# Patient Record
Sex: Female | Born: 1935
Health system: Southern US, Community
[De-identification: ages and names within clinical notes are randomized; demographics above are authoritative.]

## PROBLEM LIST (undated history)

## (undated) DIAGNOSIS — I10 Essential (primary) hypertension: Secondary | ICD-10-CM

## (undated) DIAGNOSIS — J449 Chronic obstructive pulmonary disease, unspecified: Secondary | ICD-10-CM

## (undated) DIAGNOSIS — M1990 Unspecified osteoarthritis, unspecified site: Secondary | ICD-10-CM

## (undated) DIAGNOSIS — A809 Acute poliomyelitis, unspecified: Secondary | ICD-10-CM

## (undated) DIAGNOSIS — K589 Irritable bowel syndrome without diarrhea: Secondary | ICD-10-CM

## (undated) DIAGNOSIS — E039 Hypothyroidism, unspecified: Secondary | ICD-10-CM

## (undated) DIAGNOSIS — J45909 Unspecified asthma, uncomplicated: Secondary | ICD-10-CM

## (undated) DIAGNOSIS — D352 Benign neoplasm of pituitary gland: Secondary | ICD-10-CM

## (undated) DIAGNOSIS — K5790 Diverticulosis of intestine, part unspecified, without perforation or abscess without bleeding: Secondary | ICD-10-CM

## (undated) DIAGNOSIS — B029 Zoster without complications: Secondary | ICD-10-CM

## (undated) DIAGNOSIS — E0590 Thyrotoxicosis, unspecified without thyrotoxic crisis or storm: Secondary | ICD-10-CM

## (undated) DIAGNOSIS — K219 Gastro-esophageal reflux disease without esophagitis: Secondary | ICD-10-CM

## (undated) DIAGNOSIS — I8290 Acute embolism and thrombosis of unspecified vein: Secondary | ICD-10-CM

## (undated) DIAGNOSIS — C55 Malignant neoplasm of uterus, part unspecified: Secondary | ICD-10-CM

## (undated) DIAGNOSIS — C801 Malignant (primary) neoplasm, unspecified: Secondary | ICD-10-CM

## (undated) DIAGNOSIS — G629 Polyneuropathy, unspecified: Secondary | ICD-10-CM

## (undated) DIAGNOSIS — G2581 Restless legs syndrome: Secondary | ICD-10-CM

## (undated) DIAGNOSIS — J309 Allergic rhinitis, unspecified: Secondary | ICD-10-CM

## (undated) HISTORY — PX: CATARACT EXTRACTION: SUR2

## (undated) HISTORY — PX: REPLACEMENT TOTAL KNEE: SUR1224

## (undated) HISTORY — PX: CARPAL TUNNEL RELEASE: SHX101

## (undated) HISTORY — PX: CERVICAL POLYPECTOMY: SHX88

## (undated) HISTORY — PX: TOTAL SHOULDER REPLACEMENT: SUR1217

## (undated) HISTORY — PX: TONSILLECTOMY: SUR1361

---

## 1997-12-02 DIAGNOSIS — D352 Benign neoplasm of pituitary gland: Secondary | ICD-10-CM

## 1997-12-02 HISTORY — DX: Benign neoplasm of pituitary gland: D35.2

## 1998-03-27 ENCOUNTER — Other Ambulatory Visit: Admission: RE | Admit: 1998-03-27 | Discharge: 1998-03-27 | Payer: Self-pay | Admitting: Gastroenterology

## 1998-03-30 ENCOUNTER — Inpatient Hospital Stay (HOSPITAL_COMMUNITY): Admission: AD | Admit: 1998-03-30 | Discharge: 1998-04-01 | Payer: Self-pay | Admitting: Gastroenterology

## 1998-04-06 ENCOUNTER — Other Ambulatory Visit: Admission: RE | Admit: 1998-04-06 | Discharge: 1998-04-06 | Payer: Self-pay | Admitting: Obstetrics and Gynecology

## 1998-06-08 ENCOUNTER — Other Ambulatory Visit: Admission: RE | Admit: 1998-06-08 | Discharge: 1998-06-08 | Payer: Self-pay | Admitting: Obstetrics and Gynecology

## 1998-08-31 ENCOUNTER — Other Ambulatory Visit: Admission: RE | Admit: 1998-08-31 | Discharge: 1998-08-31 | Payer: Self-pay | Admitting: Obstetrics and Gynecology

## 1999-02-01 ENCOUNTER — Encounter: Payer: Self-pay | Admitting: Neurosurgery

## 1999-02-01 ENCOUNTER — Inpatient Hospital Stay (HOSPITAL_COMMUNITY): Admission: RE | Admit: 1999-02-01 | Discharge: 1999-02-07 | Payer: Self-pay | Admitting: Neurosurgery

## 1999-02-03 ENCOUNTER — Encounter: Payer: Self-pay | Admitting: Neurosurgery

## 1999-07-11 ENCOUNTER — Other Ambulatory Visit: Admission: RE | Admit: 1999-07-11 | Discharge: 1999-07-11 | Payer: Self-pay | Admitting: Obstetrics and Gynecology

## 1999-08-28 ENCOUNTER — Ambulatory Visit (HOSPITAL_COMMUNITY): Admission: RE | Admit: 1999-08-28 | Discharge: 1999-08-28 | Payer: Self-pay | Admitting: Obstetrics and Gynecology

## 1999-08-28 ENCOUNTER — Encounter (INDEPENDENT_AMBULATORY_CARE_PROVIDER_SITE_OTHER): Payer: Self-pay | Admitting: Specialist

## 1999-09-14 ENCOUNTER — Encounter: Payer: Self-pay | Admitting: Gynecology

## 1999-09-18 ENCOUNTER — Encounter (INDEPENDENT_AMBULATORY_CARE_PROVIDER_SITE_OTHER): Payer: Self-pay

## 1999-09-18 ENCOUNTER — Inpatient Hospital Stay (HOSPITAL_COMMUNITY): Admission: RE | Admit: 1999-09-18 | Discharge: 1999-09-20 | Payer: Self-pay | Admitting: Gynecology

## 2000-04-14 ENCOUNTER — Other Ambulatory Visit: Admission: RE | Admit: 2000-04-14 | Discharge: 2000-04-14 | Payer: Self-pay | Admitting: Obstetrics and Gynecology

## 2000-06-27 ENCOUNTER — Other Ambulatory Visit: Admission: RE | Admit: 2000-06-27 | Discharge: 2000-06-27 | Payer: Self-pay | Admitting: Obstetrics and Gynecology

## 2000-11-13 ENCOUNTER — Encounter: Payer: Self-pay | Admitting: Gastroenterology

## 2000-11-13 ENCOUNTER — Encounter: Admission: RE | Admit: 2000-11-13 | Discharge: 2000-11-13 | Payer: Self-pay | Admitting: Gastroenterology

## 2000-11-19 ENCOUNTER — Encounter: Payer: Self-pay | Admitting: Gastroenterology

## 2000-11-19 ENCOUNTER — Encounter: Admission: RE | Admit: 2000-11-19 | Discharge: 2000-11-19 | Payer: Self-pay | Admitting: Gastroenterology

## 2000-12-08 ENCOUNTER — Other Ambulatory Visit: Admission: RE | Admit: 2000-12-08 | Discharge: 2000-12-08 | Payer: Self-pay | Admitting: Obstetrics and Gynecology

## 2001-04-06 ENCOUNTER — Emergency Department (HOSPITAL_COMMUNITY): Admission: EM | Admit: 2001-04-06 | Discharge: 2001-04-06 | Payer: Self-pay | Admitting: Emergency Medicine

## 2001-04-26 ENCOUNTER — Ambulatory Visit (HOSPITAL_COMMUNITY): Admission: RE | Admit: 2001-04-26 | Discharge: 2001-04-26 | Payer: Self-pay | Admitting: Neurosurgery

## 2001-04-26 ENCOUNTER — Encounter: Payer: Self-pay | Admitting: Neurosurgery

## 2001-04-30 ENCOUNTER — Encounter: Admission: RE | Admit: 2001-04-30 | Discharge: 2001-04-30 | Payer: Self-pay | Admitting: Neurosurgery

## 2001-04-30 ENCOUNTER — Encounter: Payer: Self-pay | Admitting: Neurosurgery

## 2001-05-09 ENCOUNTER — Emergency Department (HOSPITAL_COMMUNITY): Admission: EM | Admit: 2001-05-09 | Discharge: 2001-05-09 | Payer: Self-pay | Admitting: Emergency Medicine

## 2001-08-27 ENCOUNTER — Encounter: Admission: RE | Admit: 2001-08-27 | Discharge: 2001-08-27 | Payer: Self-pay | Admitting: Gastroenterology

## 2001-08-27 ENCOUNTER — Encounter: Payer: Self-pay | Admitting: Gastroenterology

## 2001-12-10 ENCOUNTER — Other Ambulatory Visit: Admission: RE | Admit: 2001-12-10 | Discharge: 2001-12-10 | Payer: Self-pay | Admitting: Gynecology

## 2001-12-25 ENCOUNTER — Encounter: Payer: Self-pay | Admitting: Orthopedic Surgery

## 2002-01-01 ENCOUNTER — Inpatient Hospital Stay (HOSPITAL_COMMUNITY): Admission: RE | Admit: 2002-01-01 | Discharge: 2002-01-02 | Payer: Self-pay | Admitting: Orthopedic Surgery

## 2002-01-01 ENCOUNTER — Encounter: Payer: Self-pay | Admitting: Orthopedic Surgery

## 2002-06-21 ENCOUNTER — Encounter: Payer: Self-pay | Admitting: Neurosurgery

## 2002-06-21 ENCOUNTER — Encounter: Admission: RE | Admit: 2002-06-21 | Discharge: 2002-06-21 | Payer: Self-pay | Admitting: Neurosurgery

## 2002-11-10 ENCOUNTER — Inpatient Hospital Stay (HOSPITAL_COMMUNITY): Admission: RE | Admit: 2002-11-10 | Discharge: 2002-11-13 | Payer: Self-pay | Admitting: Neurosurgery

## 2002-11-10 ENCOUNTER — Encounter: Payer: Self-pay | Admitting: Neurosurgery

## 2003-01-21 ENCOUNTER — Emergency Department (HOSPITAL_COMMUNITY): Admission: EM | Admit: 2003-01-21 | Discharge: 2003-01-21 | Payer: Self-pay | Admitting: *Deleted

## 2003-01-27 ENCOUNTER — Encounter: Payer: Self-pay | Admitting: Neurosurgery

## 2003-01-27 ENCOUNTER — Encounter: Admission: RE | Admit: 2003-01-27 | Discharge: 2003-01-27 | Payer: Self-pay | Admitting: Neurosurgery

## 2003-02-10 ENCOUNTER — Encounter: Admission: RE | Admit: 2003-02-10 | Discharge: 2003-02-10 | Payer: Self-pay | Admitting: Neurosurgery

## 2003-02-10 ENCOUNTER — Encounter: Payer: Self-pay | Admitting: Neurosurgery

## 2003-02-15 ENCOUNTER — Other Ambulatory Visit: Admission: RE | Admit: 2003-02-15 | Discharge: 2003-02-15 | Payer: Self-pay | Admitting: Gynecology

## 2003-02-24 ENCOUNTER — Encounter: Payer: Self-pay | Admitting: Neurosurgery

## 2003-02-24 ENCOUNTER — Encounter: Admission: RE | Admit: 2003-02-24 | Discharge: 2003-02-24 | Payer: Self-pay | Admitting: Neurosurgery

## 2004-02-07 ENCOUNTER — Encounter: Admission: RE | Admit: 2004-02-07 | Discharge: 2004-02-07 | Payer: Self-pay | Admitting: Gastroenterology

## 2004-02-14 ENCOUNTER — Encounter: Admission: RE | Admit: 2004-02-14 | Discharge: 2004-02-14 | Payer: Self-pay | Admitting: Gastroenterology

## 2004-02-16 ENCOUNTER — Other Ambulatory Visit: Admission: RE | Admit: 2004-02-16 | Discharge: 2004-02-16 | Payer: Self-pay | Admitting: Gynecology

## 2004-04-05 ENCOUNTER — Ambulatory Visit (HOSPITAL_BASED_OUTPATIENT_CLINIC_OR_DEPARTMENT_OTHER): Admission: RE | Admit: 2004-04-05 | Discharge: 2004-04-05 | Payer: Self-pay | Admitting: Otolaryngology

## 2004-04-20 ENCOUNTER — Inpatient Hospital Stay (HOSPITAL_COMMUNITY): Admission: RE | Admit: 2004-04-20 | Discharge: 2004-04-24 | Payer: Self-pay | Admitting: Specialist

## 2004-04-24 ENCOUNTER — Inpatient Hospital Stay (HOSPITAL_COMMUNITY)
Admission: RE | Admit: 2004-04-24 | Discharge: 2004-05-02 | Payer: Self-pay | Admitting: Physical Medicine & Rehabilitation

## 2004-08-08 ENCOUNTER — Other Ambulatory Visit: Admission: RE | Admit: 2004-08-08 | Discharge: 2004-08-08 | Payer: Self-pay | Admitting: Gynecology

## 2005-02-18 ENCOUNTER — Other Ambulatory Visit: Admission: RE | Admit: 2005-02-18 | Discharge: 2005-02-18 | Payer: Self-pay | Admitting: Gynecology

## 2005-03-19 ENCOUNTER — Encounter: Admission: RE | Admit: 2005-03-19 | Discharge: 2005-03-19 | Payer: Self-pay | Admitting: Specialist

## 2005-05-14 ENCOUNTER — Inpatient Hospital Stay (HOSPITAL_COMMUNITY): Admission: RE | Admit: 2005-05-14 | Discharge: 2005-05-17 | Payer: Self-pay | Admitting: Specialist

## 2005-05-17 ENCOUNTER — Inpatient Hospital Stay (HOSPITAL_COMMUNITY)
Admission: RE | Admit: 2005-05-17 | Discharge: 2005-05-28 | Payer: Self-pay | Admitting: Physical Medicine & Rehabilitation

## 2005-05-17 ENCOUNTER — Ambulatory Visit: Payer: Self-pay | Admitting: Physical Medicine & Rehabilitation

## 2005-10-02 ENCOUNTER — Other Ambulatory Visit: Admission: RE | Admit: 2005-10-02 | Discharge: 2005-10-02 | Payer: Self-pay | Admitting: Gynecology

## 2006-02-20 ENCOUNTER — Other Ambulatory Visit: Admission: RE | Admit: 2006-02-20 | Discharge: 2006-02-20 | Payer: Self-pay | Admitting: Gynecology

## 2007-02-24 ENCOUNTER — Other Ambulatory Visit: Admission: RE | Admit: 2007-02-24 | Discharge: 2007-02-24 | Payer: Self-pay | Admitting: Gynecology

## 2007-07-19 ENCOUNTER — Encounter: Admission: RE | Admit: 2007-07-19 | Discharge: 2007-07-19 | Payer: Self-pay | Admitting: Orthopedic Surgery

## 2008-06-21 ENCOUNTER — Encounter: Admission: RE | Admit: 2008-06-21 | Discharge: 2008-06-21 | Payer: Self-pay | Admitting: Endocrinology

## 2008-11-08 ENCOUNTER — Encounter: Admission: RE | Admit: 2008-11-08 | Discharge: 2008-11-08 | Payer: Self-pay | Admitting: Sports Medicine

## 2010-09-02 ENCOUNTER — Encounter: Admission: RE | Admit: 2010-09-02 | Discharge: 2010-09-02 | Payer: Self-pay | Admitting: Geriatric Medicine

## 2011-04-19 NOTE — Op Note (Signed)
[facility]  Patient:    Crystal [name], Crystal [name] [facility] Visit Number: [id number] MRN: [id number]          Service Type: SUR Location: 4W [id number] [id number] Attending Physician:  Dominica Severin Dictated by:   Elisha Ponder, M.D. Proc. 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  PROCEDURE:  Left MCP joint arthroplasty about the index finger, utilizing pyrocarbon implants from [employer].  SURGEON:  Elisha Ponder, M.D.  ASSISTANT:  Ottie Glazier. Wynona Neat, P.A.-C.  ANESTHESIA:  General.  COMPLICATIONS:  None immediate.  ESTIMATED BLOOD LOSS:  Minimal.  TOURNIQUET TIME:  56 minutes.  INDICATION FOR PROCEDURE:  The patient is a very pleasant female, who presents with the above-mentioned diagnosis.  I have counseled her in regards to the risks and benefits of surgery including risk of infection, bleeding, anesthesia, damage to normal structures, and failure of the surgery to accomplish its intended goals of relieving symptoms and restoring function. With this in mind, she desires to proceed.  All questions have been encouraged and answered preoperatively.  I discussed with her specific risks of the implant, etc.  OPERATIVE FINDINGS:  The patient has end-stage degenerative arthritis about the MCP joint at the index finger.  She underwent arthroplasty without difficulty.  She had excellent stability.  Collateral ligaments were preserved without problem, and she had an excellent, smooth, fluid range of motion at the termination of the procedure.  PROCEDURE IN DETAIL:  The patient seen by myself and anesthesia.  She underwent prophylactic antibiotics in the form of Ancef, was  taken to the operative suite and underwent a smooth induction of general endotracheal anesthesia under the direction of Benjamin Stain, M.D.  Once this was done, she was appropriately padded, prepped, and draped in the usual sterile fashion about the left upper extremity with Betadine scrub and paint.  Following this, arm was elevated; tourniquet was insufflated to 250 mmHg, and a midline incision was made over the MCP joint of the index finger.  Dissection was carried down to the tendon.  This was split in midline, as this was an osteoarthritic patient.  Following this, these tendon edges were elevated medially and laterally.  Capsule was identified and split in midline, elevated medially and laterally.  Once this was done, the patient had the awl used to enter the metacarpal head region of the index finger.  This was done without difficulty.  Following this, the guide for the cutting device was placed, checked under radiograph, and following this, cut was made about the metacarpal head.  This was done without difficulty according to the [employer] protocol.  Once this was done, the patient had pilot hole made in the proximal phalanx of the index finger.  This was done in the dorsal one-third, similar to the index finger metacarpal.  This was checked under radiograph and noted to be in excellent position.  Following this, cutting guide was placed and cut was made about the most proximal portion of the proximal phalanx articular surface.  The articular surface was removed, and there was not a lot of bone removed, in accordance with the protocol. Following this,  the patient underwent sequential distal and proximal broaching.  The patient had distal broaching accomplished first and up to a 20 mm trial.  Similarly, 20 mm trial was placed in the metacarpal head region. Trials were placed, and excellent stability in extension and flexion was noted.  The patient was able to achieve  full extension.  There was no looseness, instability, and all looked absolutely wonderful.  At this point in time, the trial implants were removed, and the permanent pyrocarbon implants were placed without difficulty.  They were gently tapped down, had a nice, snug fit, and looked excellent on radiographs.  The radiographs were saved for permanent documentation, and collateral ligament stability was assessed which was noted to be excellent in flexion and extension.  The radial collateral ligament was preserved, and I was very pleased with the overall alignment, splay of the fingers, and stability.  The patient had a smooth arc of motion. At this point in time, the tourniquet was deflated, hemostasis obtained with bipolar electrocautery and pressure.  Copious irrigation was applied and following this, the patient underwent a closure of the capsule with interrupted chromic and 4-0 Ethibond suture followed by closure of the tendon which was split midline previously with Ethibond suture.  Following this, copious irrigation was applied.  Hemostasis was checked once again, and the wound was then closed with interrupted Prolene.  A horizontal mattress suture was placed without difficulty.  Following this, Marcaine was placed in the wound, Adaptic, gauze, and dressing was placed without difficulty.  The patient had a plaster splint applied.  She tolerated the orthopedic portion of the case without difficulty, and there were no immediate intraoperative complications from the arthroplasty.  She will be observed overnight with IV antibiotics and IV pain management and will be discharged home tomorrow if things are going well.  I have discussed with the family all issues.  All questions have been encouraged and answered. Dictated by:   Elisha Ponder, M.D. Attending Physician:  Dominica Severin DD:  01/01/02 TD:  01/01/02 Job: [id number]

## 2011-04-19 NOTE — Discharge Summary (Signed)
NAME:  Crystal [name], Crystal [name]                       ACCOUNT NO.:  [id number]   MEDICAL RECORD NO.:  [id number]                   PATIENT TYPE:  IPS   LOCATION:  4143                                 FACILITY:  [facility]   PHYSICIAN:  Ellwood Dense, M.D.                DATE OF BIRTH:  Jun 07, 1925   DATE OF ADMISSION:  04/24/2004  DATE OF DISCHARGE:  05/02/2004                                 DISCHARGE SUMMARY   DISCHARGE DIAGNOSES:  1. Right total knee replacement.  2. History of post polio syndrome.  3. Hemorrhoid flare-up.  4. Postoperative anemia.  5. Peripheral neuropathy.  6. Postoperative fevers, resolved.   HISTORY OF PRESENT ILLNESS:  The patient is a 77 year old female with  history of polio effect in the right arm, DJD, DDD with end-stage OA right  knee, who elected to undergo a right total knee replacement on May 20, by  Dr. Valma Cava.  Postoperative issues include hypoxia with postoperative  anemia.  H&H at 8.6 and 25.6.  She is noted to have problems with some  postoperative fevers and chest x-ray done showed bilateral atelectasis and  incentive spirometry was recommended to help with fevers.  Therapies  initiated and the patient is at +2 total assist 48 to 50% for transfer and  takes six steps with platform rolling walker.   PAST MEDICAL HISTORY:  Significant for pituitary adenoma, right rotator cuff  repair, excision of ganglion cyst, multiple nose bleeds, diverticulosis,  chronic bursitis left shoulder with calcium deposits, peripheral neuropathy,  uterine polyps, hypothyroidism, asthma, chronic sinusitis, spinal fusion,  insomnia, frequency, urgency, and glaucoma.   ALLERGIES:  ERYTHROMYCIN, SULFA, ELAVIL, MOTRIN, NSAIDS, NOVOCAINE, PROZAC,  PENICILLIN, CLINORIL.   SOCIAL HISTORY:  The patient lives with an aunt in one-level home with two  steps at entry.  Was independent with cane prior to admission.  She does not  use any tobacco or alcohol.  Son to provide some  assistance past discharge.   HOSPITAL COURSE:  The patient was admitted to rehab on Apr 24, 2004, for  inpatient therapies to consist of PT and OT daily.  Past admission, the  patient was maintained on Coumadin for DVT prophylaxis.  Since INR was  subtherapeutic, subcu Lovenox was maintained until Coumadin at therapeutic  basis.  She was encouraged to continue with incentive spirometry as well as  use of flutter valve and to continue with low grade fevers initially.  A UA  UCS was done and grew 8000 colonies of multiple species.  Labs done showed  hemoglobin 9.1, hematocrit 26.7, white count 6.3, platelets 324.  Sodium  141, potassium 3.3, chloride 105, CO2 30, BUN 7, creatinine 0.6, glucose  109.  The patient's hypokalemia was supplemented and recheck labs showed  hypokalemia to have resolved with sodium 140, potassium 3.8, chloride 108,  CO2 26, BUN 6, creatinine 0.7.  The patient was maintained on Coumadin  throughout the stay.  She did have episodes of bright red blood per rectum  secondary to hemorrhoids with blood noted on wiping past BM.  Anusol cream  was ordered to help with treatment of hemorrhoids and a follow-up CBC done  prior to discharge shows H&H to be stable and slightly improved at 9.3 and  27.4.   The patient's knee incision has been healing well without any signs or  symptoms of infection.  The patient has been motivated and has made good  progress during her stay.  Currently, the patient is modified independent  for transfers, modified independent to supervision level of ambulating 75 to  150 feet with platform rolling walker.  She is at close supervision to  navigate stairs.  The patient is at supervision level with assistance with  set up for ADL's, supervision for toileting.  The patient's family is to  provide supervision past discharge, progress PT and OT to continue by  [facility] with home health RN arranged to draw next pro-  time on May 04, 2004.   On May 02, 2004, the patient is discharged to home.   DISCHARGE MEDICATIONS:  1. Xalatan 0.05% one gtt both eyes q.h.s.  2. Mirapex 0.25 mg two p.o. q.p.m.  3. Cymbalta 30 mg a day.  4. Trinsicon one p.o. b.i.d.  5. Prilosec 20 mg a day.  6. Coumadin 5 mg q.p.m.  7. Levothyroxine 0.125 mg a day.  8. Neurontin 800 mg q.h.s. and 600 mg b.i.d.  9. Advair 100/50 one puff b.i.d.  10.      __________ 5 mg a day.  11.      Remeron 60 mg q.h.s.  12.      Deconex 0.5 mg every 2 weeks, last dose May 29.  13.      Senokot S two p.o. q.h.s.  14.      Oxycodone IR 5 to 10 mg q.4-6h p.r.n. pain.  15.      Anusol HC cream to rectum b.i.d. for the next two days.   ACTIVITY:  Supervision to intermittent supervision.  Use walker.   DIET:  Regular.   WOUND CARE:  Wash area with soap and water.  Wear support stockings for now.   DISCHARGE INSTRUCTIONS:  No alcohol, no smoking, and no driving.  May use  Tylenol as needed for pain p.r.n.  We support stockings for now.   FOLLOW UP:  The patient is to follow up with Dr. Valma Cava and Dr.  Sherin Quarry in two to three weeks for recheck.  Follow up with Dr. Ellwood Dense as needed.      Greg Cutter, P.A.                    Ellwood Dense, M.D.    PP/MEDQ  D:  05/02/2004  T:  05/03/2004  Job:  [id number]   cc:   Tasia Catchings, M.D.  [address]  [place]  [place] [address]  Fax: [phone]   Erasmo Leventhal, M.D.  [facility]  [address]  [address]  [place]  [place] [address]  Fax: [phone]

## 2011-04-19 NOTE — Discharge Summary (Signed)
NAME:  Crystal [name], Crystal [name]                       ACCOUNT NO.:  [id number]   MEDICAL RECORD NO.:  [id number]                   PATIENT TYPE:  INP   LOCATION:  0453                                 FACILITY:  [facility]   PHYSICIAN:  Erasmo Leventhal, M.D.         DATE OF BIRTH:  11-12-1925   DATE OF ADMISSION:  04/20/2004  DATE OF DISCHARGE:  04/24/2004                                 DISCHARGE SUMMARY   ADMITTING DIAGNOSIS:  End-stage osteoarthritis, right knee.   DISCHARGE DIAGNOSIS:  End-stage osteoarthritis, right knee.   OPERATION:  Total knee arthroplasty, right knee.   BRIEF HISTORY:  This is a 77 year old lady with a long history of  osteoarthritis of the right knee with failure of conservative management to  alleviate her pain and discomfort.  Due to continued pain and discomfort,  she requests total knee arthroplasty.  The surgery, risks, benefits, and  aftercare were discussed in detail with the patient, and surgery is to go  ahead as scheduled.   LABORATORY VALUES:  Admission CBC within normal limits.  Admission PT and  PTT within normal limits.  Her hemoglobin and hematocrit reached a low of  8.6 and 25.6 on Apr 23, 2004, and was back up on Apr 24, 2004.  PT and INR  at discharge was 16.9 and 1.6.  Admission CMET within normal limits.  She  had slight hyponatremia on Apr 24, 2004, and she did run slightly elevated  glucose throughout her admission.  Urinalysis showed no growth.   COURSE IN THE HOSPITAL:  The patient had a medical consult obtained for  management of elevated glucose, and to follow her for her multiple medical  problems.  On the first postoperative day, the patient was doing well, vital  signs were stable, she was afebrile.  Her Hemovac was removed without  difficulty.  She was started on CPM and bed to chair.  Rehabilitation  consult was ordered secondary to her previous polio with difficulty of use  of her right arm.  The second postoperative day, the  patient was awake and  alert, having some problems with pain control.  Vital signs were stable.  Hemoglobin and hematocrit were in the acceptable range.  Dressing was  changed, and the wound was benign.  Plans were made for discontinuation of  the epidural so she could start on regular physical therapy.  The third  postoperative day, the patient was feeling better, vital signs stable,  moderate tachycardia at 107, temperature 100.3 with maximum of 101.7.  Hemoglobin 8.6, hematocrit 25.6.  BMET showed CO2 mildly elevated, and  glucose of 128.  Otherwise normal.  PT 17.2 with INR of 1.6.  Lungs showed  decreased breath sounds at the bases.  Heart sounds were normal.  Bowel  sounds were active.  Calves were negative.  Dressing was changed, and the  wound was benign.  Incentive spirometry and cough and deep breath q30  minutes throughout the day  was ordered in order to help with her fever.  The  PCA was discontinued, and she was switched to p.o. pain medicines, and IV  was switched to a hep well.  On the fourth postoperative day, she was  felling better, vital signs were stable, and she was afebrile.  Dressing was  changed.  The wound was benign.  Lungs showed increased air movement at the  bases.  Chest x-ray showed atelectasis.  Heart sounds were normal.  Calves  were negative.  She was rechecked as far as her hemoglobin and hematocrit  were concerned, and those came back up, and subsequently a bed became  available in the rehabilitation unit, and she was transferred to the  rehabilitation unit.   CONDITION ON TRANSFER:  Improved.   TRANSFER MEDICATIONS:  Per rehabilitation unit.   FOLLOW UP:  We will see her back in our office after discharge from the  rehabilitation unit in 10 days, or sooner p.r.n. problems.   DISCHARGE MEDICATIONS:  She will need to continue her Coumadin for a total  of 21 days postoperatively.     Jaquelyn Bitter. Chabon, P.A.                   Erasmo Leventhal,  M.D.    SJC/MEDQ  D:  05/16/2004  T:  05/16/2004  Job:  [id number]

## 2011-04-19 NOTE — H&P (Signed)
Crystal [name] NO.:  [id number]   MEDICAL RECORD NO.:  [id number]                    PATIENT TYPE:   LOCATION:                                       FACILITY:   PHYSICIAN:  Crystal [name], M.D.         DATE OF BIRTH:   DATE OF ADMISSION:  DATE OF DISCHARGE:                                HISTORY & PHYSICAL   DATE OF ADMISSION:  Apr 20, 2004   CHIEF COMPLAINT:  Right knee osteoarthritis.   HISTORY OF PRESENT ILLNESS:  This is a 77 year old lady with a long history  of osteoarthritis of her right knee with failure of conservative management  to alleviate her pain and discomfort.  Due to her continued pain and  discomfort she requests total knee arthroplasty.  The surgery, risks,  benefits, and aftercare were discussed in detail with the patient including  risk of infection, DVT with pulmonary embolus, cardiac and respiratory  difficulties, anesthesia risks, and risk of blood loss with potential need  for transfusion.  She understands the risks and benefits and would like to  proceed.  She has gotten medical clearance from her medical doctor, Dr.  Tasia Catchings, for surgery.  Surgery will go ahead as scheduled.   PAST MEDICAL HISTORY:  Drug allergy to PROZAC, SULFA, PENICILLIN,  ERYTHROMYCIN.   Current medications include:  1. Prilosec 20 mg one daily.  2. Flonase 0.05 mcg two sprays b.i.d.  3. Synthroid 0.175 mg one daily.  4. Dostinex 35 mg one every-other week.  5. Neurontin 300 mg one q.a.m. and one midday, and Neurontin 800 mg one     q.h.s.  6. Xalatan 0.005% drops one drop each eye at night.  7. Advair 100/50 one spray each nostril one time a day.  8. Mirapex 0.5 mg one daily.  9. Mirtazapine 60 mg one q.h.s.  10.      Cymbalta 30 mg one daily  11.      Vesicare 5 mg one daily.   Previous surgeries include tonsillectomy, left shoulder calcium deposit  excision, right knee arthroscopy, three procedures on the nose for  nosebleeds in the mid 1980s, carpal tunnel release left hand, CMC  arthroplasty left hand, benign uterine polyps excision, rotator cuff repair  left shoulder, arthroscopy right knee, spinal cage fusion of the low back,  hysterectomy.   Medical illnesses include a history of polio with paralysis of the right arm  and hand, allergies and chronic sinusitis and rhinitis, asthma, GERD,  hyperthyroidism, diverticulitis, peripheral neuropathy, pituitary gland  tumor which was nonmalignant.   FAMILY HISTORY:  Positive for coronary artery disease, hypertension,  diabetes, cancer, and CVA.   SOCIAL HISTORY:  The patient is widowed, lives at home with her aunt.  Her  son will be staying with her postoperatively to help her.  She does not  smoke or drink.   REVIEW OF SYSTEMS:  CENTRAL NERVOUS SYSTEM:  Positive for anxiety  and  depression.  Negative for headache, blurred vision, or dizziness.  PULMONARY:  Positive for asthma with shortness of breath.  CARDIOVASCULAR:  Negative for chest pain or palpation.  Positive for exertional shortness of  breath.  Also positive for orthopnea.  GI:  Positive for GERD.  GU:  Positive for history of UTI.   PHYSICAL EXAMINATION:  VITAL SIGNS:  Blood pressure 160/90, respirations 16,  pulse 84 and regular.  GENERAL APPEARANCE:  This is a well-developed, well-nourished lady in no  acute distress.  HEENT:  Her head is normocephalic.  Nose is patent, ears patent.  Pupils  equal, round, and reactive to light.  Throat without injection.  NECK:  Supple without adenopathy.  Carotids 2+ without bruit.  CHEST:  Clear to auscultation.  No rales or rhonchi.  Respirations 16.  HEART:  Regular rate and rhythm at 84 beats per minute without murmur.  ABDOMEN:  Soft with active bowel sounds.  No masses or organomegaly.  NEUROLOGIC:  The patient is alert and oriented to time, place, and person.  Cranial nerves II-XII grossly intact.  EXTREMITIES:  The right knee shows a varus  deformity with end-stage  osteoarthritis.  Neurovascular status is intact with dorsalis pedis and  posterior tibialis pulses 2+.   X-rays show end-stage osteoarthritis right knee.   IMPRESSION:  End-stage osteoarthritis right knee.   PLAN:  Total knee arthroplasty right knee.     Crystal [name], P.A.                   Crystal [name], M.D.    SJC/MEDQ  D:  04/06/2004  T:  04/06/2004  Job:  [id number]

## 2011-04-19 NOTE — H&P (Signed)
NAME:  Crystal [name], Crystal [name]                       ACCOUNT NO.:  [id number]   MEDICAL RECORD NO.:  [id number]                   PATIENT TYPE:  INP   LOCATION:  NA                                   FACILITY:  [facility]   PHYSICIAN:  Payton Doughty, M.D.                   DATE OF BIRTH:  05/06/1925   DATE OF ADMISSION:  DATE OF DISCHARGE:                                HISTORY & PHYSICAL   ADMISSION DIAGNOSIS:  Spondylosis L3-4 and L4-5.   HISTORY OF PRESENT ILLNESS:  This is a now 77 year old right handed white  lady who 3 1/2 years ago underwent a L5-S1 fusion and has done well.  She  has had progression of pain in her back and in her legs, and she has severe  stenosis at L4-5 and severe at L3-4.  She has claudicatory complaints and a  lot of back pain, and she is now admitted for decompression and fusion at L3-  4 and L4-5.   PAST MEDICAL HISTORY:  Remarkable for asthma.  She had polio as a child in  the upper extremity.  She has peripheral neuropathy.   MEDICATIONS:  1. Prilosec 20 mg a day.  2. Flonase 0.05 sprays b.i.d.  3. Synthroid 0.125 mg a day.  4. Dostinex  0.025 once a week.  5. Vioxx 25 mg a day.  6. Neurontin 100 mg t.i.d. and 800 at bedtime.  7. Vicodin on a p.r.n. basis.  8. Advair.  9. Remeron 60 mg at bedtime.  10.      Mirapex 0.125 mg two at bedtime.  11.      Xalatan 125 mg one drop in her eye at bedtime.   ALLERGIES:  PENICILLIN, ERYTHROMYCIN, SULFA, PROZAC, CLINORIL, ELAVIL,  PERCOCET AND NIACIN.   PAST SURGICAL HISTORY:  Tonsillectomy, left shoulder operation in 1960.  Knee operation in 1985.  Carpal tunnel in 1989.  Nose bleed in 1985.  Thumb  joint replaced on the left side in 1992.  Arthroscopy of the left knee in  1995.  Urine polyps in 1994.  Cataract in the right eye in 1992.  Rotator  cuff in the left shoulder in 1998.  Meniscectomy of the right knee in 1999.   SOCIAL HISTORY:  She does not smoke, does not drink.  She is married.   FAMILY HISTORY:   Mom and Dad were 43 and 79 when they passed away with  hypertension, cancer and peripheral neuropathy.   REVIEW OF SYSTEMS:  Remarkable for night sweats, glasses, cataracts, sinus  problems, mouth sores, occasional incontinence, leg weakness, arthritis,  thyroid disease and hormone problems.   PHYSICAL EXAMINATION:  HEENT:  Examination within normal limits.  NECK:  She has reasonable range of motion  in her neck.  CHEST:  Clear.  CARDIAC:  Exam is regular rate and rhythm.  ABDOMEN:  Nontender, no hepatosplenomegaly.  EXTREMITIES:  Without clubbing or  cyanosis.  Right upper extremity is  obviously deformed from polio.  NEUROLOGIC:  She is awake, alert and oriented.  Cranial nerves are intact.  Left upper extremity has full strength.  Right upper extremity is about 3/5  in all groups with a very shortened arm.  Lower extremity strength is full  when she is seated.  When she stands up, she has claudicatory complaints  that she drops about 3-4/5 in her lower extremities in about three minutes.  She can walk about 50 feet.   Her study shows severe spondylosis at L3-4 and L4-5 with some scoliotic  deformity.   CLINICAL IMPRESSION:  Lumbar spondylosis with neurogenic claudication.   PLAN:  Lumbar laminectomy and diskectomy, posterior lumbar interbody fusion  with right sided fusion cages and posterolateral arthrodesis and segmental  pedicle fixation from L3 to L5.  The risks and benefits of this approach  have been discussed with her and she wishes to proceed.                                               Payton Doughty, M.D.    MWR/MEDQ  D:  11/10/2002  T:  11/10/2002  Job:  [id number]

## 2011-04-19 NOTE — H&P (Signed)
[name], [name] NO.:  [id number]   MEDICAL RECORD NO.:  [id number]          PATIENT TYPE:  IPS   LOCATION:  4008                         FACILITY:  [facility]   PHYSICIAN:  Ranelle Oyster, M.D.DATE OF BIRTH:  05/27/1925   DATE OF ADMISSION:  05/17/2005  DATE OF DISCHARGE:                                HISTORY & PHYSICAL   CHIEF COMPLAINT:  Left knee pain.   HISTORY OF PRESENT ILLNESS:  This is a 77 year old white female with a  history of polio and right total knee replacement, last June, who was  admitted June 13 with end-stage changes in her left knee.  Ultimately she  underwent a left total knee replacement on June 13 by Dr. Thomasena Edis. She is  weightbearing, as tolerated, on the left lower extremity.  She is on  Coumadin for DVT prophylaxis.  Apparently the patient is a moderate assist  for transfers and gait.   REVIEW OF SYSTEMS:  On range of motion with the patient reports shortness of  breath, cough, reflux, some joint swelling, low back pain, weakness and  depression.  Full review of systems is in the written H&P.   PAST MEDICAL HISTORY:  Positive for hypothyroidism, COPD, depression,  vertigo, cataract surgery, decubitus ulcers, polio, shingles, peripheral  neuropathy, tonsillectomy, left rotator cuff repair, back surgery, right  total knee replacement in June 2005.   FAMILY HISTORY:  Noncontributory.   SOCIAL HISTORY:  The patient lives with an elderly aunt who can provide some  occasional assistance.  Son can help more at home.  Lives in a 1-level house  with 1-step to enter.  She is independent with cane prior to arrival.   MEDICATIONS PRIOR TO ARRIVAL:  Include Nasonex, Synthroid, Dostinex,  Neurontin, Xalatan eye drops, Advair, Mirapex, Remeron, and meclizine.   ALLERGIES:  PENICILLIN, CLINORIL, PROZAC, SULFA, ELAVIL, and ERYTHROMYCIN.   LABS:  Include hemoglobin 8.8, white count 10.8, platelets 206.  Sodium 139,  potassium 3.2, BUN and  creatinine 3 and 0.6. INR 1.9.   PHYSICAL EXAMINATION:  VITAL SIGNS:  Blood pressure is 139/70, pulse 92,  respiratory rate 18, temperature is 99.2.  GENERAL:  The patient is pleasant in no acute distress.  HEENT:  Pupils are equal, round, and reacted to light and accommodation.  Extraocular movements are intact.  NECK:  Supple without JVD or lymphadenopathy.  CHEST:  Clear to auscultation without wheezes, rales, or rhonchi.  HEART:  Regular rate and rhythm without murmurs, rubs, or gallops.  ABDOMEN:  Soft and nontender.  MUSCULOSKELETAL EXAMINATION:  The patient had good range of motion in her  right knee and left upper extremity.  Right upper extremity is atrophic.  Left knee is tender, appropriately to passive movement today.  Strength in  the left upper extremity is 5/5.  Right upper extremity is 1 plus-to-2/5.  Right lower extremity is 3 plus-to-4/5.  Left lower extremity is 2/5  proximally and 4/5 distally.  Wound is clean, dry and intact on the left  knee.  Skin elsewhere is intact.  NEUROLOGICAL EXAMINATION:  Cranial nerves II-XII are intact.  The reflexes  are 2+.  Sensation is normal.  The patient has appropriate judgment,  orientation, memory and affect.   ASSESSMENT AND PLAN:  1.  Left total knee replacement secondary OA of the left knee.  Begin      inpatient rehab.  Goals are modified independent to supervision with      basic mobility and self-care.  Estimated length of stay is 7-10 days.      Prognosis is fair-to-good.  2.  Deep venous thrombosis prophylaxis with Lovenox to Coumadin until INR is      greater than 2.0.  3.  Pain management with oxycodone INR, Neurontin 800 mg q.h.s. and Robaxin      q.6h. p.r.n.  4.  Postop anemia.  Continue Trinsicon 1 p.o. b.i.d.  5.  Chronic obstructive pulmonary disease:  Handheld nebulizer.  6.  Hypothyroidism:  Synthroid.  7.  Depression:  Remeron.  8.  Hypokalemia.  Supplement KCl and follow up BMET.  9.  History of  postpolio syndrome.  10. Peripheral neuropathy.       ZTS/MEDQ  D:  05/17/2005  T:  05/18/2005  Job:  [id number]

## 2011-04-19 NOTE — Discharge Summary (Signed)
NAMESERRA, Crystal [name] NO.:  [id number]   MEDICAL RECORD NO.:  [id number]          PATIENT TYPE:  IPS   LOCATION:  4008                         FACILITY:  [facility]   PHYSICIAN:  Ranelle Oyster, M.D.DATE OF BIRTH:  Dec 23, 1924   DATE OF ADMISSION:  05/17/2005  DATE OF DISCHARGE:  05/28/2005                                 DISCHARGE SUMMARY   DISCHARGE DIAGNOSES:  1.  Left total knee arthroplasty secondary to degenerative joint disease,      May 14, 2005.  2.  Pain management.  3.  Coumadin for deep vein thrombosis prophylaxis.  4.  Postoperative anemia.  5.  Chronic obstructive pulmonary disease.  6.  Hypothyroidism.  7.  Depression.  8.  Hypokalemia, resolved.  9.  History of polio with right upper extremity weakness.  10. Peripheral neuropathy.  11. History of a right total knee arthroplasty June 2005.   HISTORY OF PRESENT ILLNESS:  A 77 year old white female with history of a  right total knee arthroplasty June 2005, which she received inpatient rehab  services for, now admitted to [facility] June 13 with end-stage  changes of the left knee.  No relief with conservative care.  Underwent a  left total knee arthroplasty June 13 per Dr. Thomasena Edis.  Placed on Coumadin  for deep vein thrombosis prophylaxis, weightbearing as tolerated.  Postoperative pain control.  Platform walker, right upper extremity,  secondary to a history of polio with right upper extremity weakness.  Postoperative anemia 8.8 and monitored.  No chest pain, no shortness of  breath.  Admitted for comprehensive rehab program.   PAST MEDICAL HISTORY:  See discharge diagnoses.   No alcohol.  She does smoke approximately one-half to one pack per day.   SOCIAL HISTORY:  Lives with elderly aunt.  Aunt can provide only minimal  assistance.  Son to assist as needed.  They live in a one-level home with  one step to entry.   MEDICATIONS PRIOR TO ADMISSION:  Nasonex, Synthroid, Dostinex  weekly,  Neurontin, Xalatan, Advair, Mirapex, Remeron and meclizine.   HOSPITAL COURSE:  Patient with progressive gains while on rehab services  with therapies initiated on a b.i.d. basis.  The following issues were  followed during the patient's rehab course:   Pertaining to Ms. Pilkington's left total knee arthroplasty, surgical site  healing nicely, no signs of infection.  She was ambulating household  functional distances with a platform walker for right upper extremity due to  weakness, right upper extremity, secondary to polio.  She remained on  Coumadin therapy for deep vein thrombosis prophylaxis with latest INR of  2.1.  She will complete Coumadin protocol June 13, 2005.  She would be  followed by [place] home health agency.  Pain management ongoing with the  use of oxycodone and Neurontin with good results.  Postoperative anemia was  stable with latest hemoglobin of 11.5, hematocrit of 33.7.  she had a  longstanding history of chronic obstructive pulmonary disease.  She remained  on her Advair and was without issue during her rehab stay.  A hormone  supplement for hypothyroidism.  Documented history of depression with  Remeron.  Her mood and spirits remained upbeat throughout her rehab course.  She had no bowel or bladder disturbances.  Overall, from a functional  standpoint she was close supervision, ambulating 100 feet with a platform  rolling walker.  Minimal assist, activities of daily living, due to right  upper extremity weakness.  Modified independent bed to chair transfers.  She  was discharged to home.   DISCHARGE MEDICATIONS AT TIME OF DICTATION:  1.  Coumadin, latest dose is 6 mg, to be completed on June 13, 2005.  2.  Dostinex 0.5 mg every seven days.  3.  Advair 100/50 mcg Diskus one puff daily.  4.  Neurontin 800 mg at bedtime.  5.  Xalatan ophthalmic solution 0.005% one drop both eyes at bedtime.  6.  Synthroid 0.175 mg daily.  7.  Remeron 30 mg two tablets  daily.  8.  Nasonex two sprays daily.  9.  Mirapex 0.5 mg at bedtime.  10. Norvasc 5 mg daily.  11. Oxycodone as needed for pain.   DIET:  Regular.   Home health nurse to check INR on Thursday, June 29, per [place] home health  agency.  She would follow up with Dr. Sherin Quarry for medical management, Dr.  Valma Cava, orthopedic services.       DA/MEDQ  D:  05/27/2005  T:  05/27/2005  Job:  [id number]   cc:   Erasmo Leventhal, M.D.  [facility]  [address]  [address]  [place]  [place] [address]  Fax: [phone]   Tasia Catchings, M.D.  [address]  [place]  [place] [address]  Fax: [phone]   Lyn Records III, M.D.  [address]  [place]  [place] [address]  Fax: [phone]

## 2011-04-19 NOTE — Op Note (Signed)
NAME:  Crystal [name], Crystal [name]                       ACCOUNT NO.:  [id number]   MEDICAL RECORD NO.:  [id number]                   PATIENT TYPE:  INP   LOCATION:  3172                                 FACILITY:  [facility]   PHYSICIAN:  Payton Doughty, M.D.                   DATE OF BIRTH:  24-Aug-1925   DATE OF PROCEDURE:  11/10/2002  DATE OF DISCHARGE:                                 OPERATIVE REPORT   PREOPERATIVE DIAGNOSIS:  Spondylosis L3-4, L4-5.   POSTOPERATIVE DIAGNOSIS:  Spondylosis L3-4, L4-5.   PROCEDURE:  L3-4, L4-5 laminectomy and diskectomy. Posterior lumbar fusion  with Ray threaded fusion cages. Segmental fixation from L3 to L5 and  posterolateral arthrodesis from L3-L5.   SURGEON:  Payton Doughty, M.D.   ANESTHESIA:  General endotracheal anesthesia.   SKIN PREPARATION:  The patient was  prepped and scrubbed with an alcohol  wipe.   COMPLICATIONS:  None.   ASSISTANT:  Danae Orleans. Venetia Maxon, M.D.   INDICATIONS:  The patient is a 77 year old lady with severe spondylosis and  neurogenic claudication.   DESCRIPTION OF PROCEDURE:  The patient was intubated and placed prone on the  operating table. Following shaving and prep and drape in the usual sterile  fashion, the skin was infiltrated with 1% lidocaine with 1:400,000  epinephrine.   The skin was incised from mid L5 to mid L2 and the lamina of L3, L4 and L5  were exposed bilaterally in the subperiosteal plane out over the facet  joints. The transverse processes of L3, L4 and L5 were exposed bilaterally.  Intraoperative x-ray confirmed the correctness of the level.   The pars and articularis lamina and inferior facet of L3 and  L4 and the  superior facet of L4 and L5 were removed bilaterally using the high speed  drill and bone was set aside for grafting. Decompression was taken out by  removal of the abundant, redundant ligamentum Flavum lateral recess  decompression.   The L3, L4 and L5 nerve roots were carefully dissected  free, and once they  were completely decompressed, the Ray threaded fusion cages were placed  after bilateral facetectomies at each level;  12 x 21 mm cages were used. Pedicle screws were then placed at L3, L4 and  L5. Each was 5.75 x 40 mm screws. Intraoperative x-rays showed good  placement of screws and Ray cages.   The rods were attached to the screws and the caps tightened. The Ray cages  had previously been packed with bone harvested from the facet joints.  Transverse posterolateral bone was also placed after decortication with the  high speed drill. This bone was mixed with DBX. Final x-ray showed good  placement of the pedicle screws, Ray cages and rods.   The fascia was reapproximated with 0 Vicryl in an interrupted fashion. The  subcutaneous tissue was reapproximated with 0 Vicryl in an interrupted  fashion. The  fascicular tissues were reapproximated with 0 Vicryl in an  interrupted fashion. The skin was closed with 3-0 nylon in a running locked  fashion. Bacitracin and a Telfa dressing was applied and then closed using  OpSite. The patient returned to the recovery room in good condition.                                                Payton Doughty, M.D.    MWR/MEDQ  D:  11/10/2002  T:  11/10/2002  Job:  [id number]

## 2011-04-19 NOTE — H&P (Signed)
[facility]  Patient:    Crystal [name], [facility] Visit Number: [id number] MRN: [id number]          Service Type: Attending:  Elisha Ponder, M.D. Dictated by:   Ottie Glazier. Wynona Neat, P.A.-C.                           History and Physical  DATE OF BIRTH:  30-Mar-2025  CHIEF COMPLAINT:  Left index finger pain.  HISTORY OF PRESENT ILLNESS:  Ms. Crystal [name] is a 77 year old white female who has a history of polio with subsequent paralysis of the right arm and hand.  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  She also has resolved middle finger A1 pulley tenosynovitis, status post injections.  The patient has maximized conservative measures to date concerning the arthritic changes of the index finger and states that she is at the point now where she wishes to proceed with operative intervention.  DRUG ALLERGIES:  PROZAC, SULFA DRUGS, ELAVIL, PENICILLIN, ERYTHROMYCIN, and CLINORIL.  CURRENT MEDICATIONS:  1. Detrol LA 4 mg one p.o. q.d.  2. Prilosec 20 mg one p.o. q.d.  3. Flonase 0.05 mcg two sprays two times a day p.r.n.  4. Synthroid 100 mcg one p.o. q.d.  5. Ativan 1 mg one p.o. q.d. and q.h.s.  6. Dostinex 0.5 mg once weekly.  7. Vioxx 25 mg one p.o. q.d.  8. Neurontin 100 mg three times a day.  9. Neurontin 800 mg one at night. 10. Vicodin 5/500 mg one p.o. q.4-6h. p.r.n. 11. Alphagan T 5 ml eyedrops two every day for ocular pressure. 12. Advair 100/50 two sprays a day. 13. Remeron 30 mg one p.o. q.d. 14. The patient does get allergy shots as well. 15. Vivelle twice weekly. 16. Glucosamine chondroitin plus sulfate three times a day. 17. Baby aspirin one every other day. 18. Vitamin B12 1000 mg one daily. 19. Multivitamins one a day. 20. Calcium 500 mg b.i.d. 21. Antioxidants b.i.d.  PAST MEDICAL HISTORY:  1. Indicative of polio in the 1930s with subsequent paralysis of the right     arm and hand.  2. History of  allergic sinusitis, as well as rhinitis.  3. History of asthma.  4. History of reflux esophagitis.  5. History of hypothyroidism.  6. History of diverticulosis.  7. History of peripheral neuropathy.  8. History of pituitary gland tumor, nonmalignant.  9. History of spinal fusion in March of 2000. 10. History of hysterectomy in November of 2001. 11. History of shingles. 12. History of osteoarthritis.  PAST SURGICAL HISTORY:  1. Tonsillectomy.  2. Left shoulder arthroscopy.  3. Right knee surgery with ganglion cyst removal.  4. Balloon procedure to the nose for epistaxis with subsequently two more     surgery performed, one through the gum area and one through the eye.  5. Carpal tunnel of the left hand with release.  6. Thumb joint replacement.  7. Cataract removal and lens implant of the right eye.  8. Polyps in the uterus removed.  9. Arthroscopy of the right knee. 10. Rotator cuff surgery on the left shoulder. 11. Arthroscopic repair of right knee and chondroplasty. 12. Spinal fusion of lower back. 13. Hysterectomy.  SOCIAL HISTORY:  The patient is a nonsmoker.  Her family doctor is Genene Churn. Sherin Quarry, M.D.  REVIEW OF SYSTEMS:  General:  She denies any recent weight loss, night sweats, fevers, or chills.  HEENT:  She does have a history of allergic rhinitis.  She does wear corrective lenses.  No ringing of the ears, sore throat, or runny nose, except for her seasonal allergies.  Chest:  She denies any history of chronic cough or productive cough.  CV:  Denies any history of chest pains or irregular heartbeats.  GI:  She denies any history of chronic diarrhea, constipation, or bright red blood per rectum.  No melena.  GU:  No dysuria. Extremities:  Please see HPI.  Neurologic:  She denies any history of seizures or strokes.  She does have the right upper extremity paralysis secondary to polio.  PHYSICAL EXAMINATION:  Vital signs as per H&P sheet.  GENERAL APPEARANCE:  This  is a very pleasant, 77 year old, white female in no acute distress.  HEENT:  The head is normocephalic and atraumatic.  NECK:  Supple without masses or carotid bruits.  CHEST:  Clear to auscultation bilaterally.  BREASTS:  Examination deferred.  Not pertinent to present illness.  CARDIOVASCULAR:  The heart is regular rate and rhythm S1 and S2.  ABDOMEN:  Soft and nontender.  Bowel sounds are positive x 4 quadrants.  No guarding.  No rebound.  GENITOURINARY:  Deferred.  Not pertinent to present illness.  EXTREMITIES:  The index finger is tender over the MCP joint without any gross instability.  The radial collateral ligament appears to be intact.  The PIP and DIP joints were relatively nicely preserved.  The patient has no signs of extensor malalignment.  RADIOGRAPHS:  X-rays show significant left index finger MCP joint degenerative wear with end-stage arthritic signs.  The thumb CMC joint is stable with prior Silastic implant noted.  The middle finger does not have any advanced degenerative changes.  IMPRESSION:  1. Left index finger metacarpophalangeal joint.  2. Polio in the 1930s with subsequent paralysis of the right arm and hand.  3. History of allergic sinusitis, as well as rhinitis.  4. History of asthma.  5. History of reflux esophagitis.  6. History of hypothyroidism.  7. History of diverticulosis.  8. History of peripheral neuropathy.  8. History of pituitary gland tumor, nonmalignant. 10. History of spinal fusion in March of 2000. 11. History of hysterectomy in November of 2001. 12. History of shingles. 13. History of osteoarthritis.  PLAN:  The patient will be admitted to [facility] to undergo a left finger MCP joint replacement per Elisha Ponder, M.D.  This has been discussed in great detail with the patient to which she states good understanding concerning the risks and benefits prior to entering the operating suite.  She will have the index  finger MCP joint replacement at  [facility] on January 01, 2002, at 7:30 a.m. Dictated by:   Ottie Glazier. Wynona Neat, P.A.-C. Attending:  Elisha Ponder, M.D. DD:  12/24/01 TD:  12/24/01 Job: [id number]

## 2011-04-19 NOTE — Discharge Summary (Signed)
   NAME:  Crystal [name], Crystal [name]                       ACCOUNT NO.:  [id number]   MEDICAL RECORD NO.:  [id number]                   PATIENT TYPE:  INP   LOCATION:  3007                                 FACILITY:  [facility]   PHYSICIAN:  Stefani Dama, M.D.               DATE OF BIRTH:  [birth year]   DATE OF ADMISSION:  11/10/2002  DATE OF DISCHARGE:  11/13/2002                                 DISCHARGE SUMMARY   ADMISSION DIAGNOSIS:  L3-4 and L4-5 spondylosis and radiculopathy.   DISCHARGE DIAGNOSES:  1. L3-4 and L4-5 spondylosis and radiculopathy.  2. Polio with right-sided hemiparesis.   CONDITION ON DISCHARGE:  Improved.   HISTORY OF PRESENT ILLNESS:  The patient is a 77 year old individual who has  had significant back and leg pain secondary to spondylitic disease in her  lumbar spine.   HOSPITAL COURSE:  On November 10, 2002, she was admitted to the hospital  where she underwent L3-4 and L4-5 laminectomy, diskectomy, and Ray cage  arthrodesis with pedicle screw fixation from L3 to L5.  She also underwent  posterolateral arthrodesis.  Postoperatively, she was mobilized on the first  postoperative day.  Because of difficulties with polio, she required some  additional stay to mobilize her fully.  Her incision has remained clean and  dry.  She has been afebrile.  Currently she is tolerating hydrocodone for  pain with good control.   DISPOSITION:  She was discharged home.   DISCHARGE MEDICATIONS:  She was discharged with a prescription for Vicodin  ES, #60 without refills.  She was also given Valium 5 mg, #40 without  refills, to use for muscle spasm.   FOLLOW-UP:  She will be returned to the office at the beginning of the week  for suture removal.   CONDITION ON DISCHARGE:  Improved.                                               Stefani Dama, M.D.    Crystal [name]  D:  11/13/2002  T:  11/14/2002  Job:  [id number]

## 2011-04-19 NOTE — Discharge Summary (Signed)
Crystal [name] NO.:  [id number]   MEDICAL RECORD NO.:  [id number]          PATIENT TYPE:  INP   LOCATION:  1521                         FACILITY:  [facility]   PHYSICIAN:  Crystal [name], M.D.DATE OF BIRTH:  [birth year]   DATE OF ADMISSION:  05/14/2005  DATE OF DISCHARGE:  05/17/2005                                 DISCHARGE SUMMARY   ADMISSION DIAGNOSIS:  End-stage osteoarthritis, left knee.   DISCHARGE DIAGNOSIS:  End-stage osteoarthritis, left knee.   OPERATION:  Total knee arthroplasty, left knee.   BRIEF HISTORY:  This is a 77 year old lady well known with history of end-  stage of osteoarthritis of her right knee who underwent previous total knee  arthroplasty of the right knee with good result.  She has had end-stage  osteoarthritis of the left knee that has failed all conservative measures.  After discussion of treatment, risks, benefits, and options; the patient now  scheduled for total knee arthroplasty of the left knee.  She did have to go  to rehab afterwards last time, and will probably go there again this time  secondary to a post polio syndrome.  She has had preoperative medical  clearance by Dr. Tasia [name].   LABORATORY VALUES:  Admission CBC within normal limits.  Hemoglobin,  hematocrit reached a low of 8.8 and 25.5 on the 16th.  Admission PT, PTT  within normal limits.  Her PT was 18.3 with INR 1.9 at discharge.  Admission  CMET showed the glucose mildly elevated at 100, albumin slightly low at 3.4.  She did run mildly high glucose with a high of 140 during admission.  Low  calcium at 8.0.  One episode of hypokalemia at 3.2.  Urinalysis preop showed  urinary tract infection.  She was treated for that.   COURSE IN THE HOSPITAL:  The patient tolerated the operative procedure well.  Medical consult was obtained for management of her asthma and history of  hyponatremia and hyperglycemia.  First postoperative day she was feeling  good. Her vital signs were stable.  Temperature maximum of 99.5.  I&Os were  good.  Hemovac had a drainage of 130.  Hemoglobin 10.7, hematocrit 31.4.  Lungs were clear, but breath sounds were decreased at both bases.  Dressing  was dry.  Calves were negative.  Her drain was removed without difficulty.  She was started in incentive spirometry and ordered for a platform walker  secondary to difficulty with her right arm secondary to post polio.  Second  postoperative day she was drowsy, somewhat confused, but oriented after  being aroused.  Her vital signs were stable.  She was afebrile, temperature  to 101 max.  Hemoglobin 10.1, hematocrit 29.5, WBC 12.7.  I&Os were good.  Lungs had rales on the right side; left side was clear.  Bowel sounds were  sluggish.  Dressing was changed.  The wound was benign.  Calves were  negative.  No swelling or redness.  Chest x-ray showed atelectasis.  Her  cranial nerves II-XII grossly intact.  There was no localizing neurologic  signs, but she was somewhat disoriented and confused  intermittently.  Her O2  saturations were 98 to 100 on four liters nasal cannula.  Her daughter was  concerned about her.  We called Dr. Sherin [name] the come and check on her.  He  felt that this was overmedication and sundowning, and recommended a head CT  if she did not clear.  Over the next several hours, her confusion cleared.  She, again, had no localizing neurologic signs and her pain medicines were  cut back significantly, and she was doing much better.   On third postoperative day feeling much better.  Alert and oriented.  Vital  signs stable.  Temperature at max 99.5.  Hemoglobin 8.8, hematocrit 25.5,  WBC 10.8 with INR 1.9. I&Os good.  Lungs clear.  Bowel sounds active.  Calves negative.  Dressing change and the wound benign with the exception of  two small blisters at her Steri-Strips.  The patient was subsequently  transferred to [facility] for follow up in the office.  She was  given K-Dur 20 mEq  for her  hypokalemia.  Condition on transfer improved.  She will follow with the [facility]  physicians for medical management and rehabilitation of her total knee.  She  will follow up with Dr. Thomasena [name] in two weeks in the office or sooner p.r.n.  problems.      Crystal [name]   SJC/MEDQ  D:  06/07/2005  T:  06/07/2005  Job:  [id number]

## 2011-04-19 NOTE — H&P (Signed)
Crystal [name], HOOTS NO.:  [id number]   MEDICAL RECORD NO.:  [id number]          PATIENT TYPE:  INP   LOCATION:  NA                           FACILITY:  [facility]   PHYSICIAN:  Erasmo Leventhal, M.D.DATE OF BIRTH:  [birth year]   DATE OF ADMISSION:  05/14/2005  DATE OF DISCHARGE:                                HISTORY & PHYSICAL   CHIEF COMPLAINT:  Left knee end-stage osteoarthritis.   HISTORY OF PRESENT ILLNESS:  This is a 77 year old lady with a history of  end-stage osteoarthritis of her right knee who has undergone previous total  knee arthroplasty of her right knee with good result and has end-stage  osteoarthritis of her left knee that has failed all conservative measures.  After discussion of risks, benefits, and after care, the patient is now  scheduled for total knee arthroplasty of her left knee. She has seen her  medical doctor, Dr. Tasia Catchings for preoperative evaluation and surgery  will go ahead as scheduled.   PAST MEDICAL HISTORY:  Drug allergies to PENICILLIN, CLINORIL, PROZAC,  SULFA, ELAVIL, and ERYTHROMYCIN.   CURRENT MEDICATIONS:  1.  Nasonex 2 sprays q.d.  2.  Synthroid 0.175 mcg, 1 q.d.  3.  Dostinex 35 mg 1 every other week.  4.  Neurontin 800 mg q.h.s.  5.  Xalatan 0.005% drops, 1 drop each eye q.h.s.  6.  Advair 100/50, 1 spray one time a day.  7.  Mirapex 0.75 mg t.i.d.  8.  Remeron 30 mg 1 q.d.  9.  Meclizine 25 mg p.r.n.   SERIOUS MEDICAL ILLNESSES:  1.  Hypothyroidism.  2.  Gastroesophageal reflux disease.  3.  Peripheral neuropathy.  4.  Shingles.  5.  Asthma.  6.  History of polio.  7.  Diverticulitis.   PAST SURGICAL HISTORY:  1.  Tonsillectomy.  2.  Left shoulder rotator cuff repair.  3.  Right knee total knee arthroplasty.  4.  Nasal cauterization for nose bleeds.  5.  Left carpal tunnel release.  6.  CMC arthroplasty.  7.  Cataract surgery.  8.  Lumbar laminectomy.  9.  Lumbar spinal fusion.   FAMILY  HISTORY:  Positive for cancer, diabetes, coronary artery disease,  hypertension.   SOCIAL HISTORY:  The patient is retired, she lives at home, she does not  smoke or drink.   REVIEW OF SYMPTOMS:  CENTRAL NERVOUS SYSTEM:  Negative for headache, blurred  vision or dizziness.  PULMONARY:  Positive for shortness of breath  associated with asthma, otherwise negative. CARDIOVASCULAR:  No chest pain  or palpitations. GI:  Positive for GERD, negative for ulcers. GU:  Positive  for bladder incontinence.   PHYSICAL EXAMINATION:  VITAL SIGNS:  Blood pressure 130/82, respirations 16,  pulse 78 and regular.  GENERAL:  This is a well-developed, well-nourished lady in no acute  distress.  HEENT:  The patient is status post cataract surgery. Head is normocephalic,  nose patent, throat without injection.  NECK:  Supple without adenopathy. Carotids are 2+ without bruit.  CHEST:  Clear to auscultation. No rales, rhonchi. Respirations 16.  HEART:  Regular  rate and rhythm at 78 beats per minute without murmur.  ABDOMEN:  Soft with active bowel sounds, no masses or organomegaly.  NEUROLOGIC:  The patient alert and oriented to time, place and person.  Cranial nerves II-XII gross intact.  EXTREMITIES:  Right knee status post total knee arthroplasty with 0 to 115  degree range of motion, left  knee with 3 degree flexion contracture and  further flexion to 128 degrees. X-rays show end-stage osteoarthritis of the  left knee.   IMPRESSION:  End-stage osteoarthritis left knee.   PLAN:  Total knee arthroplasty left knee.      SJC/MEDQ  D:  05/08/2005  T:  05/08/2005  Job:  [id number]

## 2011-04-19 NOTE — Op Note (Signed)
NAME:  Crystal [name], Crystal [name]                       ACCOUNT NO.:  [id number]   MEDICAL RECORD NO.:  [id number]                   PATIENT TYPE:  INP   LOCATION:  0453                                 FACILITY:  [facility]   PHYSICIAN:  Erasmo Leventhal, M.D.         DATE OF BIRTH:  1925/07/12   DATE OF PROCEDURE:  DATE OF DISCHARGE:                                 OPERATIVE REPORT   PREOPERATIVE DIAGNOSIS:  Right knee end-stage osteoarthritis.   POSTOPERATIVE DIAGNOSIS:  Right knee end-stage osteoarthritis.   PROCEDURE:  Right total knee arthroplasty.   SURGEON:  Erasmo Leventhal, M.D.   ASSISTANT:  Jaquelyn Bitter. Chabon, P.A.-C.   ANESTHESIA:  Epidural with general.   ESTIMATED BLOOD LOSS:  Less than 50 cc.   DRAINS:  A medium Hemovac.   COMPLICATIONS:  None.   TOURNIQUET TIME:  1 hour and 25 minutes at 370 mmHg.   COMPLICATIONS:  None.   DISPOSITION:  PACU stable.   OPERATIVE IMPLANTS:  Osteonics components, Scorpion.  Size 7 femur, size 7  tibia, 12-mm flex tibia insert, 28-mm patella, posterior stabilized, all  cemented.   OPERATIVE DETAILS:  The patient was counseled in the holding area, correct  side was identified.  Chart was reviewed and signed appropriately.  Epidural  catheter was placed by anesthesiology.  The patient was  taken to the  operating room and placed in the supine position; preoperative antibiotics  had been given.  She was placed under general anesthesia.  Foley catheter  was placed utilizing sterile technique by the OR circulating nurse.  All  extremities were properly padded and bumped.  Throughout lower extremity  exam she was in varus.  She had five degrees of flexion contraction, could  flex to 130 degrees.  She was elevated, prepped with DuraPrep and draped in  the sterile fashion.   Exsanguinated with Esmarch, tourniquet was inflated to 375 mmHg.  A straight  midline incision made in the skin and subcutaneous tissues.  Small veins  were  electrocoagulated.  Medial and lateral soft tissues flaps were  developed at the appropriate level.  A medial parapatellar arthrotomy was  performed.  The medial soft tissue was released in the proximal tibia.  The  patella was everted and the knee was flexed.  End-stage arthritic changes  were noted .  There was bone-on-bone contact all compartments.  Starter hole  made in the distal femur.  Canal was irrigated until effluent was clear.  Intramedullary rod was gently placed.  We chose a 5-degree valgus cut and we  took a 10 mm cut off the distal femur.  The distal femur was found to be a  size #7.  Rotational marks were made and it was cut to a size 7.  Medial and  lateral menisci were removed under direct visualization.  Geniculate vessels  were coagulated.  Posterior neurovascular structures were protected  throughout the entire case.  Tibial eminence was resected  as was osteophytes  from proximal tibia.  Proximal tibia was found to be a size 7.  Starter hole  was made, step reamer was utilized and canal was thoroughly irrigated until  effluent was clear.  Intramedullary rod was gently dropped down to the  intramedullary canal.  We chose to take a 4 mm cut based off the medial side  at a 0-degree slope.  This was done.  Now posteromedial  and posterolateral  femoral osteophytes were removed under direct visualization.  The femoral  trochlea was prepared in the standard fashion.  At this time with a size 7  femur, a size 7 tibia with a 10 mm Flex insert, we had excellent range of  motion, soft tissue balance.  Rotational marks were made and the keel formed  in a standard fashion.  Patella was found to be a size 28 and reamed to the  appropriate depth.  Through a recessed patella, locking holes were made and  excess bone was removed.  At this time the knee was thoroughly irrigated  with pulsatile lavage.  Utilizing Mo der cement technique, all components  were cemented into place.  Size 7  femur, size 7 tibia, 12 mm Flex tibial  insert, posterior stabilized with a 28 mm patella.  After the cement had  cured, I did a trial of the 10/12 mm thickness and with a 12 mm thick tibia  insert and posterior stabilizer, we had excellent range of motion and soft  tissue balance.  At this time the trial was removed.  Excess cement was  removed.  Bone wax was placed on the exposed bony surfaces.  A 12 mm tibial  insert was placed.  At this time we had excellent range of motion, soft  tissue balance and flexion and extension, patellofemoral tracking was  anatomic, did not require lateral release.   Each layer of the knee joint was irrigated with antibiotic solution during  closure.  Two medium Hemovac drains were placed.  Sequential closure of  layers was done with arthrotomy Vicryl, subcutaneous tissue with Vicryl,  skin closed with subcuticular Monocryl suture.  Steri-Strips were applied.  Drain was then hooked to suction and sterile compressive wrap was applied.  The tourniquet was deflated.  She had normal circulation in the foot and  ankle at the end of the case.  She was given another gram of Ancef  intravenously.  Sponge and needle counts were correct.  There were no  complications.  The patient was then gently awakened and was taken to the  recovery room in stable condition.   To decrease surgical time and help with traction throughout the entire case,  Mr. Anne Hahn assistance was needed.                                               Erasmo Leventhal, M.D.    RAC/MEDQ  D:  04/20/2004  T:  04/20/2004  Job:  [id number]

## 2011-04-19 NOTE — Op Note (Signed)
[name], [name] NO.:  [id number]   MEDICAL RECORD NO.:  [id number]          PATIENT TYPE:  INP   LOCATION:  0006                         FACILITY:  [facility]   PHYSICIAN:  Erasmo Leventhal, M.D.DATE OF BIRTH:  November 20, 1925   DATE OF PROCEDURE:  05/14/2005  DATE OF DISCHARGE:                                 OPERATIVE REPORT   PREOPERATIVE DIAGNOSIS:  Left knee end-stage osteoarthritis.   POSTOPERATIVE DIAGNOSIS:  Left knee end-stage osteoarthritis.   PROCEDURE:  Left total knee arthroplasty.   SURGEON:  Erasmo Leventhal, M.D.   ASSISTANT:  Jaquelyn Bitter. Chabon, PA-C.   ANESTHESIA:  General.   BLOOD LOSS:  Less than 200 cc.   DRAINS:  Two Hemovac.   COMPLICATIONS:  None.   TOURNIQUET TIME:  One hour and 10 minutes at 350 mmHg.   COMPLICATIONS:  None.   DISPOSITION:  To PACU stable.   OPERATIVE IMPLANTS:  [employer], size 3  femur, size 3 tibia, a 10 mm rotating platform with a posterior stabilized  tibial insert and a 35 mm patella.   OPERATIVE DETAILS:  The patient was counseled in the holding area, and the  correct side was identified.  An IV was started.  Antibiotics were given.  Taken to the OR.  Placed in supine position.  General anesthesia.  Foley  catheter was placed, utilizing a sterile technique by the OR circulating  nurse.  All extremities were well padded and bumped.  The left knee was  examined.  She had about a 7 degree flexion contracture.  She flexed 120  degrees.  Elevated and prepped with DuraPrep.  Draped in a sterile fashion.  Exsanguinated with esmarch.  The tourniquet was inflated to 350 mmHg.  A  straight midline incision was made through the skin and subcutaneous  tissues.  Small veins were electrocoagulated, and mediolateral soft tissue  flaps developed.  Medial parapatellar arthrotomy was performed.  The patella  was everted.  The knee was flexed.  The proximal and medial tibial  soft  tissues were released.  Bone-against-bone contact and end-stage arthritic  change.  Cruciate ligaments were resected.  A starting hole was made in the  distal femur.  The canal was irrigated.  Effluent was clear.  The  intramedullary rod was placed.  We chose a 5 degree valgus cut and took an  11 mm cut off the distal femur due to her flexion contracture.  Osteophytes  were removed from the distal femur.  The distal femur was found to be a size  #3, and rotational marks were made for external rotation, and the distal  femur was cut to fit a size #3.  Medial and lateral menisci were removed.  Geniculate vessels were coagulated.  Posterior neurovascular structures were  thawed off and protected throughout the entire case.  The tibial eminence  was resected.  Osteophytes were removed off the proximal tibia.  The  proximal tibia was found to be a size 3.  The rotating center was marked.  The stepper reamer was utilized.  The canal irrigated.  Effluent  was clear.  The intramedullary rod was gently placed.  I chose a 10 mm cut, based upon  the lateral side in a 0 degree slope.  The posterior medial and posterior  femoral osteophytes were removed under direct visualization on the femur.  Posterior neurovascular structures were tied off and protected throughout  the entire case.  A femoral box was then cut in a standard fashion.  At this  time, a size 3 femur and size 3 tibia insert were a little bit tight in  flexion, and based upon that, the trial was removed.  We took another 10 mm  off the proximal tibia.  At this time, with a 3 femur, 3 tibia, and 10  insert with excellent range of motion and soft tissue balance.  The patella  was found to be a size 35.  It was cut appropriately.  Locking holes were  made.  Had a well-balanced knee at this time.  The trial was then removed.  The tibial base cut was well-centered.  The punch and keel was performed in  a standard fashion.  At this time,  utilizing modern cement technique, all  components were cemented into place after pulsatile copious lavage had been  made.  The size 3 tibia, size 3 femur, size 35 patella.  After the cement  had cured, excess cement was removed.  We did a trial with a 10 mm thick  trial.  We had excellent flexion and extension gaps, well-balanced flexion  and extension.  Patellofemoral tracking was satisfactory.  Soft tissue  release was done laterally.  A trial was then removed.  Excess cement was  then removed.  No complications or problems noted.  The final 10 mm thick  rotating platform posterior stabilized tibial insert was implanted for the  rotating platform tibia.  At this time, we had full flexion and extension,  well-balanced flexion and extension, varus and valgus stress.  Gaps were  well-balanced.  Patellofemoral tracking was excellent.  The wounds were  copiously irrigated.  Two medium Hemovac drains were placed, and the knee  joint and soft tissue were irrigated with saline during closure.  Closure  arthrotomy was done with subcu Vicryl.  The skin was closed with  subcuticular Monocryl suture.  Sterile dressings were applied.  The  tourniquet was deflated, and another 1 gm of Ancef given intravenously.  Ice  packs were applied and the knee immobilized to full extension.  No  complications or problems.  The patient tolerated the procedure well.  She  was then gently awakened and taken to the operating room and PACU in stable  condition.   To help with surgical decision-making, retraction, and assistance throughout  the entire case, Mr. Brett Canales Chabon's, PA-C, assistance was needed.        RAC/MEDQ  D:  05/14/2005  T:  05/14/2005  Job:  [id number]

## 2013-12-29 ENCOUNTER — Ambulatory Visit (HOSPITAL_COMMUNITY)
Admission: RE | Admit: 2013-12-29 | Discharge: 2013-12-29 | Disposition: A | Discharge status: Home / Self Care | Payer: Medicare Other | Source: Ambulatory Visit | Attending: Internal Medicine | Admitting: Internal Medicine

## 2013-12-29 ENCOUNTER — Other Ambulatory Visit (HOSPITAL_COMMUNITY): Payer: Self-pay | Admitting: Geriatric Medicine

## 2013-12-29 DIAGNOSIS — R7989 Other specified abnormal findings of blood chemistry: Secondary | ICD-10-CM

## 2013-12-29 DIAGNOSIS — R609 Edema, unspecified: Secondary | ICD-10-CM | POA: Insufficient documentation

## 2013-12-29 DIAGNOSIS — R799 Abnormal finding of blood chemistry, unspecified: Secondary | ICD-10-CM | POA: Insufficient documentation

## 2013-12-29 DIAGNOSIS — M7989 Other specified soft tissue disorders: Secondary | ICD-10-CM

## 2013-12-29 NOTE — Progress Notes (Signed)
*  PRELIMINARY RESULTS* Vascular Ultrasound Right lower extremity venous duplex has been completed.  Preliminary findings: no evidence of DVT or baker's cyst.  Attempted call report with no answer. Left voice message on voicemail for [employer]. Discharged patient.   Landry Mellow, RDMS, RVT  12/29/2013, 4:06 PM

## 2014-01-17 ENCOUNTER — Emergency Department (HOSPITAL_COMMUNITY): Payer: Medicare Other

## 2014-01-17 ENCOUNTER — Inpatient Hospital Stay (HOSPITAL_COMMUNITY)
Admission: EM | Admit: 2014-01-17 | Discharge: 2014-02-01 | DRG: 167 | Disposition: A | Discharge status: Skilled Nursing Facility | Payer: Medicare Other | Attending: Internal Medicine | Admitting: Internal Medicine

## 2014-01-17 ENCOUNTER — Encounter (HOSPITAL_COMMUNITY): Payer: Self-pay | Admitting: Emergency Medicine

## 2014-01-17 DIAGNOSIS — D131 Benign neoplasm of stomach: Secondary | ICD-10-CM | POA: Diagnosis present

## 2014-01-17 DIAGNOSIS — I824Z9 Acute embolism and thrombosis of unspecified deep veins of unspecified distal lower extremity: Secondary | ICD-10-CM | POA: Diagnosis present

## 2014-01-17 DIAGNOSIS — Z87891 Personal history of nicotine dependence: Secondary | ICD-10-CM

## 2014-01-17 DIAGNOSIS — R000 Tachycardia, unspecified: Secondary | ICD-10-CM | POA: Diagnosis present

## 2014-01-17 DIAGNOSIS — K589 Irritable bowel syndrome without diarrhea: Secondary | ICD-10-CM | POA: Diagnosis present

## 2014-01-17 DIAGNOSIS — R778 Other specified abnormalities of plasma proteins: Secondary | ICD-10-CM

## 2014-01-17 DIAGNOSIS — Z96659 Presence of unspecified artificial knee joint: Secondary | ICD-10-CM

## 2014-01-17 DIAGNOSIS — R0602 Shortness of breath: Secondary | ICD-10-CM

## 2014-01-17 DIAGNOSIS — I2699 Other pulmonary embolism without acute cor pulmonale: Principal | ICD-10-CM | POA: Diagnosis present

## 2014-01-17 DIAGNOSIS — R0989 Other specified symptoms and signs involving the circulatory and respiratory systems: Secondary | ICD-10-CM

## 2014-01-17 DIAGNOSIS — J4489 Other specified chronic obstructive pulmonary disease: Secondary | ICD-10-CM | POA: Diagnosis present

## 2014-01-17 DIAGNOSIS — Z8542 Personal history of malignant neoplasm of other parts of uterus: Secondary | ICD-10-CM

## 2014-01-17 DIAGNOSIS — I959 Hypotension, unspecified: Secondary | ICD-10-CM | POA: Diagnosis present

## 2014-01-17 DIAGNOSIS — Z880 Allergy status to penicillin: Secondary | ICD-10-CM

## 2014-01-17 DIAGNOSIS — Z79899 Other long term (current) drug therapy: Secondary | ICD-10-CM

## 2014-01-17 DIAGNOSIS — I872 Venous insufficiency (chronic) (peripheral): Secondary | ICD-10-CM | POA: Diagnosis present

## 2014-01-17 DIAGNOSIS — Z889 Allergy status to unspecified drugs, medicaments and biological substances status: Secondary | ICD-10-CM

## 2014-01-17 DIAGNOSIS — I10 Essential (primary) hypertension: Secondary | ICD-10-CM | POA: Diagnosis present

## 2014-01-17 DIAGNOSIS — I2789 Other specified pulmonary heart diseases: Secondary | ICD-10-CM | POA: Diagnosis present

## 2014-01-17 DIAGNOSIS — I517 Cardiomegaly: Secondary | ICD-10-CM

## 2014-01-17 DIAGNOSIS — K922 Gastrointestinal hemorrhage, unspecified: Secondary | ICD-10-CM | POA: Diagnosis not present

## 2014-01-17 DIAGNOSIS — Z96619 Presence of unspecified artificial shoulder joint: Secondary | ICD-10-CM

## 2014-01-17 DIAGNOSIS — E039 Hypothyroidism, unspecified: Secondary | ICD-10-CM | POA: Diagnosis present

## 2014-01-17 DIAGNOSIS — Z8612 Personal history of poliomyelitis: Secondary | ICD-10-CM

## 2014-01-17 DIAGNOSIS — R0600 Dyspnea, unspecified: Secondary | ICD-10-CM

## 2014-01-17 DIAGNOSIS — I82409 Acute embolism and thrombosis of unspecified deep veins of unspecified lower extremity: Secondary | ICD-10-CM | POA: Diagnosis present

## 2014-01-17 DIAGNOSIS — J449 Chronic obstructive pulmonary disease, unspecified: Secondary | ICD-10-CM | POA: Diagnosis present

## 2014-01-17 DIAGNOSIS — D62 Acute posthemorrhagic anemia: Secondary | ICD-10-CM | POA: Diagnosis not present

## 2014-01-17 DIAGNOSIS — R7989 Other specified abnormal findings of blood chemistry: Secondary | ICD-10-CM

## 2014-01-17 DIAGNOSIS — G609 Hereditary and idiopathic neuropathy, unspecified: Secondary | ICD-10-CM | POA: Diagnosis present

## 2014-01-17 DIAGNOSIS — E0590 Thyrotoxicosis, unspecified without thyrotoxic crisis or storm: Secondary | ICD-10-CM | POA: Diagnosis present

## 2014-01-17 DIAGNOSIS — K219 Gastro-esophageal reflux disease without esophagitis: Secondary | ICD-10-CM | POA: Diagnosis present

## 2014-01-17 DIAGNOSIS — K5730 Diverticulosis of large intestine without perforation or abscess without bleeding: Secondary | ICD-10-CM | POA: Diagnosis present

## 2014-01-17 DIAGNOSIS — R0609 Other forms of dyspnea: Secondary | ICD-10-CM

## 2014-01-17 DIAGNOSIS — M129 Arthropathy, unspecified: Secondary | ICD-10-CM | POA: Diagnosis present

## 2014-01-17 HISTORY — DX: Thyrotoxicosis, unspecified without thyrotoxic crisis or storm: E05.90

## 2014-01-17 HISTORY — DX: Benign neoplasm of pituitary gland: D35.2

## 2014-01-17 HISTORY — DX: Unspecified osteoarthritis, unspecified site: M19.90

## 2014-01-17 HISTORY — DX: Unspecified asthma, uncomplicated: J45.909

## 2014-01-17 HISTORY — DX: Zoster without complications: B02.9

## 2014-01-17 HISTORY — DX: Gastro-esophageal reflux disease without esophagitis: K21.9

## 2014-01-17 HISTORY — DX: Malignant neoplasm of uterus, part unspecified: C55

## 2014-01-17 HISTORY — DX: Malignant (primary) neoplasm, unspecified: C80.1

## 2014-01-17 HISTORY — DX: Allergic rhinitis, unspecified: J30.9

## 2014-01-17 HISTORY — DX: Diverticulosis of intestine, part unspecified, without perforation or abscess without bleeding: K57.90

## 2014-01-17 HISTORY — DX: Irritable bowel syndrome, unspecified: K58.9

## 2014-01-17 HISTORY — DX: Acute poliomyelitis, unspecified: A80.9

## 2014-01-17 HISTORY — DX: Essential (primary) hypertension: I10

## 2014-01-17 HISTORY — DX: Polyneuropathy, unspecified: G62.9

## 2014-01-17 LAB — COMPREHENSIVE METABOLIC PANEL
ALK PHOS: 104 U/L (ref 39–117)
ALT: 17 U/L (ref 0–35)
AST: 26 U/L (ref 0–37)
Albumin: 3.3 g/dL — ABNORMAL LOW (ref 3.5–5.2)
BUN: 25 mg/dL — ABNORMAL HIGH (ref 6–23)
CO2: 23 meq/L (ref 19–32)
Calcium: 9.2 mg/dL (ref 8.4–10.5)
Chloride: 97 mEq/L (ref 96–112)
Creatinine, Ser: 0.89 mg/dL (ref 0.50–1.10)
GFR, EST AFRICAN AMERICAN: 66 mL/min — AB (ref >90)
GFR, EST NON AFRICAN AMERICAN: 57 mL/min — AB (ref >90)
GLUCOSE: 126 mg/dL — AB (ref 70–99)
POTASSIUM: 4.3 meq/L (ref 3.7–5.3)
Sodium: 135 mEq/L — ABNORMAL LOW (ref 137–147)
Total Bilirubin: 0.4 mg/dL (ref 0.3–1.2)
Total Protein: 6.8 g/dL (ref 6.0–8.3)

## 2014-01-17 LAB — CBC
HCT: 37.4 % (ref 36.0–46.0)
HEMOGLOBIN: 12.5 g/dL (ref 12.0–15.0)
MCH: 27.2 pg (ref 26.0–34.0)
MCHC: 33.4 g/dL (ref 30.0–36.0)
MCV: 81.5 fL (ref 78.0–100.0)
Platelets: 380 10*3/uL (ref 150–400)
RBC: 4.59 MIL/uL (ref 3.87–5.11)
RDW: 14.8 % (ref 11.5–15.5)
WBC: 12 10*3/uL — ABNORMAL HIGH (ref 4.0–10.5)

## 2014-01-17 LAB — PROTIME-INR
INR: 1.16 (ref 0.00–1.49)
Prothrombin Time: 14.6 seconds (ref 11.6–15.2)

## 2014-01-17 LAB — TROPONIN I
Troponin I: 0.46 ng/mL (ref <0.30)
Troponin I: 0.33 ng/mL (ref <0.30)

## 2014-01-17 LAB — PRO B NATRIURETIC PEPTIDE: Pro B Natriuretic peptide (BNP): 604.6 pg/mL — ABNORMAL HIGH (ref 0–450)

## 2014-01-17 LAB — APTT: aPTT: 31 seconds (ref 24–37)

## 2014-01-17 MED ORDER — PRAMIPEXOLE DIHYDROCHLORIDE 0.25 MG PO TABS
0.7500 mg | ORAL_TABLET | Freq: Every day | ORAL | Status: DC
  Administered 2014-01-18 – 2014-01-31 (×14): 0.75 mg via ORAL
  Filled 2014-01-17 (×17): qty 3

## 2014-01-17 MED ORDER — SODIUM CHLORIDE 0.9 % IJ SOLN
3.0000 mL | Freq: Two times a day (BID) | INTRAMUSCULAR | Status: DC
  Administered 2014-01-18 – 2014-01-19 (×3): 3 mL via INTRAVENOUS

## 2014-01-17 MED ORDER — TRAZODONE HCL 50 MG PO TABS
50.0000 mg | ORAL_TABLET | Freq: Every evening | ORAL | Status: DC | PRN
  Administered 2014-01-19: 50 mg via ORAL
  Filled 2014-01-17: qty 1

## 2014-01-17 MED ORDER — NITROGLYCERIN 0.4 MG SL SUBL
0.4000 mg | SUBLINGUAL_TABLET | SUBLINGUAL | Status: DC | PRN
  Administered 2014-01-17: 0.4 mg via SUBLINGUAL
  Filled 2014-01-17: qty 25

## 2014-01-17 MED ORDER — GABAPENTIN 400 MG PO CAPS
800.0000 mg | ORAL_CAPSULE | Freq: Every day | ORAL | Status: DC
  Administered 2014-01-18 – 2014-01-31 (×14): 800 mg via ORAL
  Filled 2014-01-17 (×17): qty 2

## 2014-01-17 MED ORDER — CALCIUM CARBONATE 1250 (500 CA) MG PO TABS
1.0000 | ORAL_TABLET | Freq: Two times a day (BID) | ORAL | Status: DC
  Administered 2014-01-18 – 2014-02-01 (×25): 500 mg via ORAL
  Filled 2014-01-17 (×32): qty 1

## 2014-01-17 MED ORDER — CABERGOLINE 0.5 MG PO TABS: 0.2500 mg | ORAL_TABLET | ORAL | Status: DC

## 2014-01-17 MED ORDER — ASPIRIN 81 MG PO CHEW
324.0000 mg | CHEWABLE_TABLET | Freq: Once | ORAL | Status: AC
  Administered 2014-01-17: 324 mg via ORAL
  Filled 2014-01-17: qty 4

## 2014-01-17 MED ORDER — PANTOPRAZOLE SODIUM 40 MG PO TBEC
40.0000 mg | DELAYED_RELEASE_TABLET | Freq: Every day | ORAL | Status: DC
  Administered 2014-01-18 – 2014-02-01 (×15): 40 mg via ORAL
  Filled 2014-01-17 (×15): qty 1

## 2014-01-17 MED ORDER — GUAIFENESIN ER 600 MG PO TB12
600.0000 mg | ORAL_TABLET | Freq: Two times a day (BID) | ORAL | Status: DC | PRN
  Administered 2014-01-18 – 2014-01-19 (×2): 600 mg via ORAL
  Filled 2014-01-17 (×3): qty 1

## 2014-01-17 MED ORDER — HEPARIN BOLUS VIA INFUSION
4000.0000 [IU] | Freq: Once | INTRAVENOUS | Status: AC
  Administered 2014-01-17: 4000 [IU] via INTRAVENOUS
  Filled 2014-01-17: qty 4000

## 2014-01-17 MED ORDER — SODIUM CHLORIDE 0.9 % IV BOLUS (SEPSIS)
500.0000 mL | Freq: Once | INTRAVENOUS | Status: AC
  Administered 2014-01-17: 500 mL via INTRAVENOUS

## 2014-01-17 MED ORDER — MIRTAZAPINE 30 MG PO TABS
60.0000 mg | ORAL_TABLET | Freq: Every day | ORAL | Status: DC
  Administered 2014-01-18 – 2014-01-31 (×14): 60 mg via ORAL
  Filled 2014-01-17 (×17): qty 2

## 2014-01-17 MED ORDER — ALBUTEROL SULFATE HFA 108 (90 BASE) MCG/ACT IN AERS: 2.0000 | INHALATION_SPRAY | Freq: Four times a day (QID) | RESPIRATORY_TRACT | Status: DC | PRN

## 2014-01-17 MED ORDER — HEPARIN (PORCINE) IN NACL 100-0.45 UNIT/ML-% IJ SOLN
1000.0000 [IU]/h | INTRAMUSCULAR | Status: AC
  Administered 2014-01-17: 1200 [IU]/h via INTRAVENOUS
  Administered 2014-01-18: 1000 [IU]/h via INTRAVENOUS
  Filled 2014-01-17 (×2): qty 250

## 2014-01-17 MED ORDER — FLUTICASONE PROPIONATE HFA 44 MCG/ACT IN AERO
1.0000 | INHALATION_SPRAY | Freq: Two times a day (BID) | RESPIRATORY_TRACT | Status: DC
  Administered 2014-01-18 – 2014-01-20 (×5): 1 via RESPIRATORY_TRACT
  Filled 2014-01-17 (×2): qty 10.6

## 2014-01-17 MED ORDER — IOHEXOL 350 MG/ML SOLN
80.0000 mL | Freq: Once | INTRAVENOUS | Status: AC | PRN
  Administered 2014-01-17: 80 mL via INTRAVENOUS

## 2014-01-17 MED ORDER — SODIUM CHLORIDE 0.9 % IV BOLUS (SEPSIS)
1000.0000 mL | Freq: Once | INTRAVENOUS | Status: AC
  Administered 2014-01-17: 1000 mL via INTRAVENOUS

## 2014-01-17 MED ORDER — NAPROXEN 250 MG PO TABS
220.0000 mg | ORAL_TABLET | Freq: Two times a day (BID) | ORAL | Status: DC
  Administered 2014-01-18: 250 mg via ORAL
  Filled 2014-01-17 (×3): qty 1

## 2014-01-17 MED ORDER — LEVOTHYROXINE SODIUM 100 MCG PO TABS
100.0000 ug | ORAL_TABLET | Freq: Every day | ORAL | Status: DC
  Administered 2014-01-18 – 2014-02-01 (×14): 100 ug via ORAL
  Filled 2014-01-17 (×16): qty 1

## 2014-01-17 NOTE — ED Notes (Signed)
Dr. Zavitz at bedside with US. 

## 2014-01-17 NOTE — Consult Note (Addendum)
Name: Crystal [name] MRN: [id number] DOB: 02-Apr-1925    ADMISSION DATE:  01/17/2014 CONSULTATION DATE:  2/16  REFERRING MD :  Reather Converse PRIMARY SERVICE:  [facility]/ PCCM consulting   CHIEF COMPLAINT:  Pulmonary emboli w/ right heart strain and tachycardia   BRIEF PATIENT DESCRIPTION:  80 year old female admitted 2/16  w/ bilateral Pulmonary emboli and CT evidence of right heart strain (RV/LV ration 1.8). PCCM asked to evaluate.   SIGNIFICANT EVENTS / STUDIES:  2/16 CT chest: 1. Bilateral pulmonary thromboemboli emboli involving all of the lung lobes. 2. CT evidence of right heartstrain (RV/LV Ratio = 1.8) consistent with at least submassive (intermediate risk) PE 3. Small focus of pneumonia versus in pulmonary infarction in the left upper lobe. 2/16 ECHO >>> 2/17 bilateral LE dopplers>>> Hypercoagulation panel  ATIII>>>  Protein C activity>>> Protein C total>>> Protein S activity>>> Protein S total>>> Lupus anticoagulant>>> Beta-2 glycoprotein>>> Homocysteine>>> Factor 5 leiden>>> Prothrombin gene mutation >>> Cardiolipin antibodies IgG, IgM,IgA>>>  LINES / TUBES:   CULTURES:   ANTIBIOTICS:   HISTORY OF PRESENT ILLNESS:   80 year old female who reports was in usual state of health until about 2 weeks prior to admission on 2/16. She noted at that point increase in chronic right > left Lower extremity swelling, but also at that time sinus congestion, cough, and dyspnea felt to be c/w URI, for which she was prescribed doxy (but did not finsih due to nausea). She had been seen by a visiting nurse who had advised her to have her LE swelling evaluated. She did report to [facility]. Bilateral LE dopplers were negative on 1/28. She reported that over the following couple of weeks she has not felt well. Has done little in the form of exertion. She finally decided to report to the ER when her dyspnea got to the point she could no longer ambulate further than 10 feet w/out needing to  catch her breath. These episodes were associated w/ pleuritic type chest discomfort. She reported to the ER. Emergency room evaluation did include CT angio of chest that did indeed find bilateral pulmonary emboli w/ submassive clot burden. PCCM was asked to evaluate.   PAST MEDICAL HISTORY :  Past Medical History  Diagnosis Date  . Hypertension   . Arthritis   . GERD (gastroesophageal reflux disease)   . Polio   . Hyperthyroidism   . Peripheral neuropathy   . Shingles   . IBS (irritable bowel syndrome)   . Allergic sinusitis   . Asthma   . Diverticulosis   . Benign tumor of pituitary gland 1999  . IBS (irritable bowel syndrome)   . Cancer   . Uterine cancer    Past Surgical History  Procedure Laterality Date  . Replacement total knee    . Total shoulder replacement    . Tonsillectomy    . Carpal tunnel release Left   . Cataract extraction Right   . Cervical polypectomy     Prior to Admission medications   Medication Sig Start Date End Date Taking? Authorizing Provider  albuterol (PROVENTIL HFA;VENTOLIN HFA) 108 (90 BASE) MCG/ACT inhaler Inhale 2 puffs into the lungs every 6 (six) hours as needed for wheezing or shortness of breath.   Yes Historical Provider, MD  amLODipine (NORVASC) 5 MG tablet Take 2.5 mg by mouth daily.   Yes Historical Provider, MD  beclomethasone (QVAR) 80 MCG/ACT inhaler Inhale 1 puff into the lungs 2 (two) times daily.   Yes Historical Provider,  MD  benazepril (LOTENSIN) 10 MG tablet Take 10 mg by mouth daily.   Yes Historical Provider, MD  cabergoline (DOSTINEX) 0.5 MG tablet Take 0.25 mg by mouth See admin instructions. Takes 1 every other week   Yes Historical Provider, MD  calcium carbonate (OS-CAL - DOSED IN MG OF ELEMENTAL CALCIUM) 1250 MG tablet Take 1 tablet by mouth 2 (two) times daily.   Yes Historical Provider, MD  Cholecalciferol (VITAMIN D PO) Take 1 tablet by mouth daily.   Yes Historical Provider, MD  doxycycline (DORYX) 100 MG EC tablet  Take 100 mg by mouth 2 (two) times daily. Dispensed 12/31/13. 10 day supply   Yes Historical Provider, MD  gabapentin (NEURONTIN) 400 MG capsule Take 400-800 mg by mouth 2 (two) times daily. Take 400mg  in the morning and 800mg  in the evening   Yes Historical Provider, MD  guaiFENesin (MUCINEX) 600 MG 12 hr tablet Take 600 mg by mouth 2 (two) times daily as needed for cough or to loosen phlegm.   Yes Historical Provider, MD  levothyroxine (SYNTHROID, LEVOTHROID) 100 MCG tablet Take 100 mcg by mouth daily before breakfast.   Yes Historical Provider, MD  mirtazapine (REMERON) 30 MG tablet Take 60 mg by mouth at bedtime.   Yes Historical Provider, MD  naproxen sodium (ANAPROX) 220 MG tablet Take 220 mg by mouth 2 (two) times daily with a meal.   Yes Historical Provider, MD  Omega-3 Fatty Acids (FISH OIL) 1200 MG CAPS Take 1,200 mg by mouth daily.   Yes Historical Provider, MD  omeprazole (PRILOSEC OTC) 20 MG tablet Take 20 mg by mouth daily.   Yes Historical Provider, MD  pramipexole (MIRAPEX) 0.75 MG tablet Take 0.75 mg by mouth at bedtime.   Yes Historical Provider, MD  Pseudoephedrine-APAP-DM (DAYQUIL PO) Take 15 mLs by mouth daily as needed (cold symptoms).   Yes Historical Provider, MD  ranitidine (ZANTAC) 300 MG tablet Take 300 mg by mouth daily as needed for heartburn.   Yes Historical Provider, MD  traZODone (DESYREL) 50 MG tablet Take 50 mg by mouth at bedtime as needed for sleep.   Yes Historical Provider, MD  vitamin B-12 (CYANOCOBALAMIN) 1000 MCG tablet Take 1,000 mcg by mouth daily.   Yes Historical Provider, MD  vitamin E 100 UNIT capsule Take 100 Units by mouth daily.   Yes Historical Provider, MD   Allergies  Allergen Reactions  . Clinoril [Sulindac] Rash  . Erythromycin Rash  . Penicillins Rash  . Prozac [Fluoxetine Hcl] Rash  . Sulfa Antibiotics Rash    FAMILY HISTORY:  History reviewed. No pertinent family history. SOCIAL HISTORY:  reports that she has quit smoking. She does  not have any smokeless tobacco history on file. She reports that she does not drink alcohol. Her drug history is not on file.  Review of Systems:   Bolds are positive  Constitutional: weight loss, gain, night sweats, Fevers, chills, fatigue .  HEENT: headaches, Sore throat, sneezing, nasal congestion, post nasal drip, Difficulty swallowing, Tooth/dental problems, visual complaints visual changes, ear ache CV:  chest pain, radiates: ,Orthopnea, PND, swelling in lower extremities, R>L, no recent travel or trauma. , dizziness, palpitations, syncope.  GI  heartburn, indigestion, abdominal pain, nausea, vomiting, diarrhea, change in bowel habits, loss of appetite, bloody stools.  Resp: cough, productive: , hemoptysis, dyspnea, chest pain, pleuritic.  Skin: rash or itching or icterus GU: dysuria, change in color of urine, urgency or frequency. flank pain, hematuria  MS: joint pain or swelling.  decreased range of motion  Psych: change in mood or affect. depression or anxiety.  Neuro: difficulty with speech, weakness, numbness, ataxia    SUBJECTIVE:  No acute distress.   VITAL SIGNS: Temp:  [98.1 F (36.7 C)] 98.1 F (36.7 C) (02/16 1913) Pulse Rate:  [111-123] 118 (02/16 2200) Resp:  [18-25] 19 (02/16 2200) BP: (92-132)/(53-102) 92/53 mmHg (02/16 2200) SpO2:  [90 %-95 %] 91 % (02/16 2200)  PHYSICAL EXAMINATION: General:  Awake, no distress.  Neuro:  No focal def  HEENT:  NCAT, no JVD  Cardiovascular:  rrr Lungs:  CTA no accessory muscle use  Abdomen:  NT, No OM, + bs  Musculoskeletal:  Intact  Skin:  R>L LE swelling    Recent Labs Lab 01/17/14 2006  NA 135*  K 4.3  CL 97  CO2 23  BUN 25*  CREATININE 0.89  GLUCOSE 126*    Recent Labs Lab 01/17/14 2002  HGB 12.5  HCT 37.4  WBC 12.0*  PLT 380   Dg Chest 2 View  01/17/2014   CLINICAL DATA:  Shortness of breath  EXAM: CHEST  2 VIEW  COMPARISON:  05/16/2005  FINDINGS: The cardiac shadow is within normal limits. A small  hiatal hernia is noted. The lungs are well aerated demonstrate no focal infiltrate or sizable effusion. Degenerative change of thoracic spine is seen.  IMPRESSION: Hiatal hernia.  No other focal abnormality is noted.   Electronically Signed   By: Alcide Clever M.D.   On: 01/17/2014 20:54   Ct Angio Chest Pe W/cm &/or Wo Cm  01/17/2014   CLINICAL DATA:  Dyspnea on exertion, chest pain  EXAM: CT ANGIOGRAPHY CHEST WITH CONTRAST  TECHNIQUE: Multidetector CT imaging of the chest was performed using the standard protocol during bolus administration of intravenous contrast. Multiplanar CT image reconstructions and MIPs were obtained to evaluate the vascular anatomy.  CONTRAST:  65mL OMNIPAQUE IOHEXOL 350 MG/ML SOLN  COMPARISON:  DG CHEST 2 VIEW dated 01/17/2014; DG CHEST 2 VIEW dated 12/25/2001  FINDINGS: There are tubular defects within left and right pulmonary arteries consistent acute pulmonary emboli. Defects extend to the right upper lobe, right lower lobe, left upper lobe, lingula, and left lower lobe. Overall clot burden is moderate. The right ventricular left ventricular volume ratio equals 1.8.  View there is a small focus of airspace disease in the left upper lobe. This could represent in pneumonia or infarction. No pneumothorax.  No axillary supraclavicular lymphadenopathy. No mediastinal hilar lymphadenopathy.  Large hiatal hernia is present. Limited view of the upper abdomen is unremarkable.  Review of the skeleton is unremarkable.  Review of the MIP images confirms the above findings.  IMPRESSION: 1. Bilateral pulmonary thromboemboli emboli involving all of the lung lobes. 2. CT evidence of right heartstrain (RV/LV Ratio = 1.8) consistent with at least submassive (intermediate risk) PE. The presence of right heart strain has been associated with anincreased risk of morbidity and mortality. Consultation with [facility] is recommended. 3. Small focus of pneumonia versus in pulmonary  infarction in the left upper lobe. Critical Value/emergent results were called by telephone at the time of interpretation on 01/17/2014 at 9:55 PM to Dr. Blane Ohara , who verbally acknowledged these results.   Electronically Signed   By: Genevive Bi M.D.   On: 01/17/2014 21:56    ASSESSMENT / PLAN: Acute submassive Bilateral Pulmonary Emboli.  >at this point risk factors appear to be sedentary life style and chronic venous stasis disease of  LEs. She had h/o uterine cancer remotely. She has not smoked for > 40 years. Suspect propagated in RLE.  Recommendation  - admit to SDU - cont heparin gtt for now, currently no need for lytics - if stable over next 24 hrs then decide of warfarin vs xarelto  - will repeat LE dopplers - get echo - send hypercaog panel - will need minimally 6 mo - 1 year treatment. Could make argument for life long given age and sedentary life style.   This note is for services rendered on 2/16.  Patient seen and examined, agree with above note.  I dictated the care and orders written for this patient under my direction.  Rush Farmer, MD [phone]

## 2014-01-17 NOTE — ED Notes (Signed)
intensivitst at bedside.

## 2014-01-17 NOTE — ED Notes (Signed)
Pt placed back on monitor. And vitals updated.

## 2014-01-17 NOTE — ED Provider Notes (Signed)
CSN: [id number]     Arrival date & time 01/17/14  1913 History   First MD Initiated Contact with Patient 01/17/14 1930     Chief Complaint  Patient presents with  . Shortness of Breath     (Consider location/radiation/quality/duration/timing/severity/associated sxs/prior Treatment) HPI Comments: 80 yo female with asthma, htn hx presents with exertional dyspnea gradually worsening for one week, no hx of similar, feels different than her asthma.  Patient denies blood clot history, active cancer, recent major trauma or surgery, unilateral leg swelling/ pain, recent long travel, hemoptysis or oral contraceptives. No cardiac hx known. Worse with exertion.  Mild LE swelling bilateral worse on right, recent US leg per pt, no dvt per pt.  Mild intermittent chest tightness lasting a few minutes, non exertional, no diaphoresis.  Pt can walk 10 steps and then sxs.    Patient is a 80 y.o. female presenting with shortness of breath. The history is provided by the patient.  Shortness of Breath Associated symptoms: cough   Associated symptoms: no abdominal pain, no chest pain, no fever, no headaches, no neck pain, no rash and no vomiting     Past Medical History  Diagnosis Date  . Hypertension   . Arthritis   . GERD (gastroesophageal reflux disease)   . Polio   . Hyperthyroidism   . Peripheral neuropathy   . Shingles   . IBS (irritable bowel syndrome)   . Allergic sinusitis   . Asthma   . Diverticulosis   . Benign tumor of pituitary gland 1999  . IBS (irritable bowel syndrome)    Past Surgical History  Procedure Laterality Date  . Replacement total knee    . Total shoulder replacement    . Tonsillectomy    . Carpal tunnel release Left   . Cataract extraction Right   . Cervical polypectomy     History reviewed. No pertinent family history. History  Substance Use Topics  . Smoking status: Former Research scientist (life sciences)  . Smokeless tobacco: Not on file  . Alcohol Use: No   OB History   Grav Para  Term Preterm Abortions TAB SAB Ect Mult Living                 Review of Systems  Constitutional: Negative for fever and chills.  HENT: Negative for congestion.   Eyes: Negative for visual disturbance.  Respiratory: Positive for cough and shortness of breath.   Cardiovascular: Negative for chest pain.  Gastrointestinal: Negative for vomiting and abdominal pain.  Genitourinary: Negative for dysuria and flank pain.  Musculoskeletal: Negative for back pain, neck pain and neck stiffness.  Skin: Negative for rash.  Neurological: Positive for light-headedness. Negative for headaches.      Allergies  Clinoril; Erythromycin; Penicillins; Prozac; and Sulfa antibiotics  Home Medications   Current Outpatient Rx  Name  Route  Sig  Dispense  Refill  . albuterol (PROVENTIL HFA;VENTOLIN HFA) 108 (90 BASE) MCG/ACT inhaler   Inhalation   Inhale 2 puffs into the lungs every 6 (six) hours as needed for wheezing or shortness of breath.         Marland Kitchen amLODipine (NORVASC) 5 MG tablet   Oral   Take 2.5 mg by mouth daily.         . beclomethasone (QVAR) 80 MCG/ACT inhaler   Inhalation   Inhale 1 puff into the lungs 2 (two) times daily.         . benazepril (LOTENSIN) 10 MG tablet   Oral   Take  10 mg by mouth daily.         . cabergoline (DOSTINEX) 0.5 MG tablet   Oral   Take 0.25 mg by mouth See admin instructions. Takes 1 every other week         . calcium carbonate (OS-CAL - DOSED IN MG OF ELEMENTAL CALCIUM) 1250 MG tablet   Oral   Take 1 tablet by mouth 2 (two) times daily.         . Cholecalciferol (VITAMIN D PO)   Oral   Take 1 tablet by mouth daily.         Marland Kitchen doxycycline (DORYX) 100 MG EC tablet   Oral   Take 100 mg by mouth 2 (two) times daily. Dispensed 12/31/13. 10 day supply         . gabapentin (NEURONTIN) 400 MG capsule   Oral   Take 400-800 mg by mouth 2 (two) times daily. Take 400mg  in the morning and 800mg  in the evening         . guaiFENesin  (MUCINEX) 600 MG 12 hr tablet   Oral   Take 600 mg by mouth 2 (two) times daily as needed for cough or to loosen phlegm.         Marland Kitchen levothyroxine (SYNTHROID, LEVOTHROID) 100 MCG tablet   Oral   Take 100 mcg by mouth daily before breakfast.         . mirtazapine (REMERON) 30 MG tablet   Oral   Take 60 mg by mouth at bedtime.         . naproxen sodium (ANAPROX) 220 MG tablet   Oral   Take 220 mg by mouth 2 (two) times daily with a meal.         . Omega-3 Fatty Acids (FISH OIL) 1200 MG CAPS   Oral   Take 1,200 mg by mouth daily.         Marland Kitchen omeprazole (PRILOSEC OTC) 20 MG tablet   Oral   Take 20 mg by mouth daily.         . pramipexole (MIRAPEX) 0.75 MG tablet   Oral   Take 0.75 mg by mouth at bedtime.         . Pseudoephedrine-APAP-DM (DAYQUIL PO)   Oral   Take 15 mLs by mouth daily as needed (cold symptoms).         . ranitidine (ZANTAC) 300 MG tablet   Oral   Take 300 mg by mouth daily as needed for heartburn.         . traZODone (DESYREL) 50 MG tablet   Oral   Take 50 mg by mouth at bedtime as needed for sleep.         . vitamin B-12 (CYANOCOBALAMIN) 1000 MCG tablet   Oral   Take 1,000 mcg by mouth daily.         . vitamin E 100 UNIT capsule   Oral   Take 100 Units by mouth daily.          BP 117/62  Pulse 117  Temp(Src) 98.1 F (36.7 C) (Oral)  Resp 18  SpO2 90% Physical Exam  Nursing note and vitals reviewed. Constitutional: She is oriented to person, place, and time. She appears well-developed and well-nourished.  HENT:  Head: Normocephalic and atraumatic.  Eyes: Conjunctivae are normal. Right eye exhibits no discharge. Left eye exhibits no discharge.  Neck: Normal range of motion. Neck supple. No tracheal deviation present.  Cardiovascular: Regular rhythm.  Tachycardia  present.   No murmur heard. Pulmonary/Chest: Effort normal. She has rales (few at bases).  Abdominal: Soft. She exhibits no distension. There is no tenderness.  There is no guarding.  Musculoskeletal: She exhibits edema (mild bilateral lE, worse right ankle).  Neurological: She is alert and oriented to person, place, and time. No cranial nerve deficit.  Skin: Skin is warm. No rash noted.  Psychiatric: She has a normal mood and affect.    ED Course  Procedures (including critical care time)   EMERGENCY DEPARTMENT [place] CARDIAC EXAM "Study: Limited Ultrasound of the heart and pericardium"  INDICATIONS:Unstable Vital Signs and Dyspnea Multiple views of the heart and pericardium were obtained in real-time with a multi-frequency probe.  PERFORMED [id number]  IMAGES ARCHIVED?: Yes  FINDINGS: No pericardial effusion, Normal contractility and Tamponade physiology absent Right heart > left heart volume  LIMITATIONS:  Body habitus  VIEWS USED: Subcostal 4 chamber, Parasternal long axis and Parasternal short axis  INTERPRETATION: Cardiac activity present, Pericardial effusioin absent and Normal contractility Right heart strain, concern for PE  Emergency Ultrasound: Limited Thoracic Performed and interpreted by Dr Reather Converse Longitudinal view of anterior left and right lung fields in real-time with linear probe. Indication: acute dyspnea Findings: pos lung sliding no B lines Interpretation: no evidence of pneumothorax. Images electronically archived.      Labs Review Labs Reviewed  CBC - Abnormal; Notable for the following:    WBC 12.0 (*)    All other components within normal limits  COMPREHENSIVE METABOLIC PANEL - Abnormal; Notable for the following:    Sodium 135 (*)    Glucose, Bld 126 (*)    BUN 25 (*)    Albumin 3.3 (*)    GFR calc non Af Amer 57 (*)    GFR calc Af Amer 66 (*)    All other components within normal limits  TROPONIN I - Abnormal; Notable for the following:    Troponin I 0.46 (*)    All other components within normal limits  PRO B NATRIURETIC PEPTIDE - Abnormal; Notable for the following:    Pro B Natriuretic peptide  (BNP) 604.6 (*)    All other components within normal limits  TROPONIN I  PROTIME-INR  APTT   Imaging Review Dg Chest 2 View  01/17/2014   CLINICAL DATA:  Shortness of breath  EXAM: CHEST  2 VIEW  COMPARISON:  05/16/2005  FINDINGS: The cardiac shadow is within normal limits. A small hiatal hernia is noted. The lungs are well aerated demonstrate no focal infiltrate or sizable effusion. Degenerative change of thoracic spine is seen.  IMPRESSION: Hiatal hernia.  No other focal abnormality is noted.   Electronically Signed   By: Inez Catalina M.D.   On: 01/17/2014 20:54   Ct Angio Chest Pe W/cm &/or Wo Cm  01/17/2014   CLINICAL DATA:  Dyspnea on exertion, chest pain  EXAM: CT ANGIOGRAPHY CHEST WITH CONTRAST  TECHNIQUE: Multidetector CT imaging of the chest was performed using the standard protocol during bolus administration of intravenous contrast. Multiplanar CT image reconstructions and MIPs were obtained to evaluate the vascular anatomy.  CONTRAST:  35mL OMNIPAQUE IOHEXOL 350 MG/ML SOLN  COMPARISON:  DG CHEST 2 VIEW dated 01/17/2014; DG CHEST 2 VIEW dated 12/25/2001  FINDINGS: There are tubular defects within left and right pulmonary arteries consistent acute pulmonary emboli. Defects extend to the right upper lobe, right lower lobe, left upper lobe, lingula, and left lower lobe. Overall clot burden is moderate. The right ventricular left  ventricular volume ratio equals 1.8.  View there is a small focus of airspace disease in the left upper lobe. This could represent in pneumonia or infarction. No pneumothorax.  No axillary supraclavicular lymphadenopathy. No mediastinal hilar lymphadenopathy.  Large hiatal hernia is present. Limited view of the upper abdomen is unremarkable.  Review of the skeleton is unremarkable.  Review of the MIP images confirms the above findings.  IMPRESSION: 1. Bilateral pulmonary thromboemboli emboli involving all of the lung lobes. 2. CT evidence of right heartstrain (RV/LV Ratio =  1.8) consistent with at least submassive (intermediate risk) PE. The presence of right heart strain has been associated with anincreased risk of morbidity and mortality. Consultation with [facility] is recommended. 3. Small focus of pneumonia versus in pulmonary infarction in the left upper lobe. Critical Value/emergent results were called by telephone at the time of interpretation on 01/17/2014 at 9:55 PM to Dr. Elnora Morrison , who verbally acknowledged these results.   Electronically Signed   By: Suzy Bouchard M.D.   On: 01/17/2014 21:56    EKG Interpretation   None       MDM   Final diagnoses:  None   Concern for chf vs unstable angina vs PE vs other.  CXR and cardiac eval. With clear xray and tachy/ right leg swelling, bedside US done cardiac by myself Showed enlarge right heart, Clinically PE. Fluids given, nitro prn.  CT angio showed multiple PE with right heart strain. Heparin per pharmacy. Spoke with radiology. Spoke with critical care for admission.  Rechecked multiple times, mild tachycardia, mild chest pain.  Morphine prn.  Critical care to admit. The patients results and plan were reviewed and discussed.   Any x-rays performed were personally reviewed by myself.   Differential diagnosis were considered with the presenting HPI.  Diagnosis: Acute PE, Troponin elevation, Right heart strain, Dyspnea acute  [id number] tach Filed Vitals:   01/17/14 2000 01/17/14 2015 01/17/14 2102 01/17/14 2115  BP: 117/62 112/65 132/102 103/62  Pulse: 117 116 115 111  Temp:      TempSrc:      Resp: 18 25 19 24   SpO2: 90% 93% 95% 94%    Admission/ observation were discussed with the admitting physician, patient and/or family and they are comfortable with the plan.     Mariea Clonts, MD 01/17/14 [phone]

## 2014-01-17 NOTE — ED Notes (Signed)
Dr. Reather Converse updated pt.

## 2014-01-17 NOTE — ED Notes (Signed)
Lab called sts able to use light blue top hold.

## 2014-01-17 NOTE — ED Notes (Signed)
RN explained physiology of pulmonary embolisms, pt vital signs and correlation to dx. And purpose of heparin to pt and family member.

## 2014-01-17 NOTE — ED Notes (Signed)
Pt becoming increasingly SOB, feels like she will pass out when walking. HR 120's, BP 120/78. O2 sats 85% on RA. Sats now 94% on 4L [place]. Pt does have bilateral leg edema.

## 2014-01-17 NOTE — H&P (Signed)
[facility] History and Physical  Crystal [name] [id number] DOB: 1925/01/07 DOA: 01/17/2014  Referring physician: EDP PCP: Mathews Argyle, MD   Chief Complaint: SOB   HPI: Crystal [name] is a 80 y.o. female who presents to the ED with SOB.  Symptoms have been going on for the past week per patient.  She states her insurance sent an RN out to the house who noted increased swelling in her RLE.  Was sent in for for [place] but this showed no DVT per patient.  She returns to the ED today with ongoing SOB and chest tightness.  In the ED the patient is found to have submassive PE on work up!  PCCM consulted, does not feel she should receive lytics at this point, patient put on heparin gtt and hospitalst asked to admit to SDU.  Review of Systems: Systems reviewed.  As above, otherwise negative  Past Medical History  Diagnosis Date  . Hypertension   . Arthritis   . GERD (gastroesophageal reflux disease)   . Polio   . Hyperthyroidism   . Peripheral neuropathy   . Shingles   . IBS (irritable bowel syndrome)   . Allergic sinusitis   . Asthma   . Diverticulosis   . Benign tumor of pituitary gland 1999  . IBS (irritable bowel syndrome)   . Cancer   . Uterine cancer    Past Surgical History  Procedure Laterality Date  . Replacement total knee    . Total shoulder replacement    . Tonsillectomy    . Carpal tunnel release Left   . Cataract extraction Right   . Cervical polypectomy     Social History:  reports that she has quit smoking. She does not have any smokeless tobacco history on file. She reports that she does not drink alcohol. Her drug history is not on file.  Allergies  Allergen Reactions  . Clinoril [Sulindac] Rash  . Erythromycin Rash  . Penicillins Rash  . Prozac [Fluoxetine Hcl] Rash  . Sulfa Antibiotics Rash    History reviewed. No pertinent family history.   Prior to Admission medications   Medication Sig Start Date End Date Taking?  Authorizing Provider  albuterol (PROVENTIL HFA;VENTOLIN HFA) 108 (90 BASE) MCG/ACT inhaler Inhale 2 puffs into the lungs every 6 (six) hours as needed for wheezing or shortness of breath.   Yes Historical Provider, MD  amLODipine (NORVASC) 5 MG tablet Take 2.5 mg by mouth daily.   Yes Historical Provider, MD  beclomethasone (QVAR) 80 MCG/ACT inhaler Inhale 1 puff into the lungs 2 (two) times daily.   Yes Historical Provider, MD  benazepril (LOTENSIN) 10 MG tablet Take 10 mg by mouth daily.   Yes Historical Provider, MD  cabergoline (DOSTINEX) 0.5 MG tablet Take 0.25 mg by mouth See admin instructions. Takes 1 every other week   Yes Historical Provider, MD  calcium carbonate (OS-CAL - DOSED IN MG OF ELEMENTAL CALCIUM) 1250 MG tablet Take 1 tablet by mouth 2 (two) times daily.   Yes Historical Provider, MD  Cholecalciferol (VITAMIN D PO) Take 1 tablet by mouth daily.   Yes Historical Provider, MD  doxycycline (DORYX) 100 MG EC tablet Take 100 mg by mouth 2 (two) times daily. Dispensed 12/31/13. 10 day supply   Yes Historical Provider, MD  gabapentin (NEURONTIN) 400 MG capsule Take 400-800 mg by mouth 2 (two) times daily. Take 400mg  in the morning and 800mg  in the evening   Yes Historical Provider, MD  guaiFENesin [facility])  600 MG 12 hr tablet Take 600 mg by mouth 2 (two) times daily as needed for cough or to loosen phlegm.   Yes Historical Provider, MD  levothyroxine (SYNTHROID, LEVOTHROID) 100 MCG tablet Take 100 mcg by mouth daily before breakfast.   Yes Historical Provider, MD  mirtazapine (REMERON) 30 MG tablet Take 60 mg by mouth at bedtime.   Yes Historical Provider, MD  naproxen sodium (ANAPROX) 220 MG tablet Take 220 mg by mouth 2 (two) times daily with a meal.   Yes Historical Provider, MD  Omega-3 Fatty Acids (FISH OIL) 1200 MG CAPS Take 1,200 mg by mouth daily.   Yes Historical Provider, MD  omeprazole (PRILOSEC OTC) 20 MG tablet Take 20 mg by mouth daily.   Yes Historical Provider, MD   pramipexole (MIRAPEX) 0.75 MG tablet Take 0.75 mg by mouth at bedtime.   Yes Historical Provider, MD  Pseudoephedrine-APAP-DM (DAYQUIL PO) Take 15 mLs by mouth daily as needed (cold symptoms).   Yes Historical Provider, MD  ranitidine (ZANTAC) 300 MG tablet Take 300 mg by mouth daily as needed for heartburn.   Yes Historical Provider, MD  traZODone (DESYREL) 50 MG tablet Take 50 mg by mouth at bedtime as needed for sleep.   Yes Historical Provider, MD  vitamin B-12 (CYANOCOBALAMIN) 1000 MCG tablet Take 1,000 mcg by mouth daily.   Yes Historical Provider, MD  vitamin E 100 UNIT capsule Take 100 Units by mouth daily.   Yes Historical Provider, MD   Physical Exam: Filed Vitals:   01/17/14 2230  BP: 116/80  Pulse: 114  Temp:   Resp: 22    BP 116/80  Pulse 114  Temp(Src) 98.1 F (36.7 C) (Oral)  Resp 22  SpO2 95%  General Appearance:    Alert, oriented, no distress, appears stated age  Head:    Normocephalic, atraumatic  Eyes:    PERRL, EOMI, sclera non-icteric        Nose:   Nares without drainage or epistaxis. Mucosa, turbinates normal  Throat:   Moist mucous membranes. Oropharynx without erythema or exudate.  Neck:   Supple. No carotid bruits.  No thyromegaly.  No lymphadenopathy.   Back:     No CVA tenderness, no spinal tenderness  Lungs:     Clear to auscultation bilaterally, without wheezes, rhonchi or rales  Chest wall:    No tenderness to palpitation  Heart:    Regular rate and rhythm without murmurs, gallops, rubs  Abdomen:     Soft, non-tender, nondistended, normal bowel sounds, no organomegaly  Genitalia:    deferred  Rectal:    deferred  Extremities:   No clubbing, cyanosis or edema.  Pulses:   2+ and symmetric all extremities  Skin:   Skin color, texture, turgor normal, no rashes or lesions  Lymph nodes:   Cervical, supraclavicular, and axillary nodes normal  Neurologic:   CNII-XII intact. Normal strength, sensation and reflexes      throughout    Labs on  Admission:  Basic Metabolic Panel:  Recent Labs Lab 01/17/14 2006  NA 135*  K 4.3  CL 97  CO2 23  GLUCOSE 126*  BUN 25*  CREATININE 0.89  CALCIUM 9.2   Liver Function Tests:  Recent Labs Lab 01/17/14 2006  AST 26  ALT 17  ALKPHOS 104  BILITOT 0.4  PROT 6.8  ALBUMIN 3.3*   No results found for this basename: LIPASE, AMYLASE,  in the last 168 hours No results found for this basename: AMMONIA,  in the last 168 hours CBC:  Recent Labs Lab 01/17/14 2002  WBC 12.0*  HGB 12.5  HCT 37.4  MCV 81.5  PLT 380   Cardiac Enzymes:  Recent Labs Lab 01/17/14 2002  TROPONINI 0.46*    BNP (last 3 results)  Recent Labs  01/17/14 2002  PROBNP 604.6*   CBG: No results found for this basename: GLUCAP,  in the last 168 hours  Radiological Exams on Admission: Dg Chest 2 View  01/17/2014   CLINICAL DATA:  Shortness of breath  EXAM: CHEST  2 VIEW  COMPARISON:  05/16/2005  FINDINGS: The cardiac shadow is within normal limits. A small hiatal hernia is noted. The lungs are well aerated demonstrate no focal infiltrate or sizable effusion. Degenerative change of thoracic spine is seen.  IMPRESSION: Hiatal hernia.  No other focal abnormality is noted.   Electronically Signed   By: Inez Catalina M.D.   On: 01/17/2014 20:54   Ct Angio Chest Pe W/cm &/or Wo Cm  01/17/2014   CLINICAL DATA:  Dyspnea on exertion, chest pain  EXAM: CT ANGIOGRAPHY CHEST WITH CONTRAST  TECHNIQUE: Multidetector CT imaging of the chest was performed using the standard protocol during bolus administration of intravenous contrast. Multiplanar CT image reconstructions and MIPs were obtained to evaluate the vascular anatomy.  CONTRAST:  90mL OMNIPAQUE IOHEXOL 350 MG/ML SOLN  COMPARISON:  DG CHEST 2 VIEW dated 01/17/2014; DG CHEST 2 VIEW dated 12/25/2001  FINDINGS: There are tubular defects within left and right pulmonary arteries consistent acute pulmonary emboli. Defects extend to the right upper lobe, right lower lobe,  left upper lobe, lingula, and left lower lobe. Overall clot burden is moderate. The right ventricular left ventricular volume ratio equals 1.8.  View there is a small focus of airspace disease in the left upper lobe. This could represent in pneumonia or infarction. No pneumothorax.  No axillary supraclavicular lymphadenopathy. No mediastinal hilar lymphadenopathy.  Large hiatal hernia is present. Limited view of the upper abdomen is unremarkable.  Review of the skeleton is unremarkable.  Review of the MIP images confirms the above findings.  IMPRESSION: 1. Bilateral pulmonary thromboemboli emboli involving all of the lung lobes. 2. CT evidence of right heartstrain (RV/LV Ratio = 1.8) consistent with at least submassive (intermediate risk) PE. The presence of right heart strain has been associated with anincreased risk of morbidity and mortality. Consultation with [facility] is recommended. 3. Small focus of pneumonia versus in pulmonary infarction in the left upper lobe. Critical Value/emergent results were called by telephone at the time of interpretation on 01/17/2014 at 9:55 PM to Dr. Elnora Morrison , who verbally acknowledged these results.   Electronically Signed   By: Suzy Bouchard M.D.   On: 01/17/2014 21:56    EKG: Independently reviewed.  Assessment/Plan Principal Problem:   Bilateral pulmonary embolism Active Problems:   PE (pulmonary embolism)   1. Submassive PE - formal 2d echo pending but patient clearly has R heart strain on bedside echo, CT scan, and further evidenced by borderline hypotension, elevated troponins (trending these as well), and an EKG which demonstrates a classic S1 QT3 pattern.  Patient on heparin gtt, repeating venous duplex of BLE to look for further clot, if further clot then ? If patient should receive IVC filter in morning.  Holding all BP meds as she was very borderline hypotensive on arrival to the ED (BP 92/53).  PCCM has consulted and  recommends no lytics at this time (see their note).  Code Status: Full Code  Family Communication: No family in room Disposition Plan: Admit to SDU   Time spent: 70 min  GARDNER, JARED M. [facility] Pager [phone]  If 7AM-7PM, please contact the day team taking care of the patient [email or web address] Password [facility] 01/17/2014, 11:47 PM

## 2014-01-17 NOTE — Progress Notes (Signed)
ANTICOAGULATION CONSULT NOTE - Initial Consult  Pharmacy Consult for heparin Indication: pulmonary embolus  Allergies  Allergen Reactions  . Clinoril [Sulindac] Rash  . Erythromycin Rash  . Penicillins Rash  . Prozac [Fluoxetine Hcl] Rash  . Sulfa Antibiotics Rash    Patient Measurements:   Heparin Dosing Weight: 77.1kg  Vital Signs: Temp: 98.1 F (36.7 C) (02/16 1913) Temp src: Oral (02/16 1913) BP: 103/62 mmHg (02/16 2115) Pulse Rate: 111 (02/16 2115)  Labs:  Recent Labs  01/17/14 2002 01/17/14 2006  HGB 12.5  --   HCT 37.4  --   PLT 380  --   CREATININE  --  0.89  TROPONINI 0.46*  --     CrCl is unknown because there is no height on file for the current visit.   Medical History: Past Medical History  Diagnosis Date  . Hypertension   . Arthritis   . GERD (gastroesophageal reflux disease)   . Polio   . Hyperthyroidism   . Peripheral neuropathy   . Shingles   . IBS (irritable bowel syndrome)   . Allergic sinusitis   . Asthma   . Diverticulosis   . Benign tumor of pituitary gland 1999  . IBS (irritable bowel syndrome)   . Cancer   . Uterine cancer     Medications:  Infusions:  . heparin    . heparin    . sodium chloride      Assessment: 41 yof presented to the ED with increasing SOB. CT scan revealed bilateral PE with R heart strain. To start IV heparin for anticoagulation. H/H 12.5/37.4, plts 380. She is not on any anticoagulation PTA.   Goal of Therapy:  Heparin level 0.3-0.7 units/ml Monitor platelets by anticoagulation protocol: Yes   Plan:  1. Heparin bolus 4000 units IV x 1 2. Heparin gtt 1200 units/hr 3. Check an 8 hour heparin level 4. Daily heparin level and CBC 5. F/u start of oral anticoagulation  Janeen Watson, Rande Lawman 01/17/2014,10:07 PM

## 2014-01-18 ENCOUNTER — Encounter (HOSPITAL_COMMUNITY): Payer: Self-pay | Admitting: Emergency Medicine

## 2014-01-18 DIAGNOSIS — I959 Hypotension, unspecified: Secondary | ICD-10-CM | POA: Diagnosis present

## 2014-01-18 DIAGNOSIS — I82409 Acute embolism and thrombosis of unspecified deep veins of unspecified lower extremity: Secondary | ICD-10-CM

## 2014-01-18 DIAGNOSIS — I059 Rheumatic mitral valve disease, unspecified: Secondary | ICD-10-CM

## 2014-01-18 LAB — BASIC METABOLIC PANEL
BUN: 20 mg/dL (ref 6–23)
CHLORIDE: 104 meq/L (ref 96–112)
CO2: 23 mEq/L (ref 19–32)
Calcium: 8.1 mg/dL — ABNORMAL LOW (ref 8.4–10.5)
Creatinine, Ser: 0.7 mg/dL (ref 0.50–1.10)
GFR calc non Af Amer: 76 mL/min — ABNORMAL LOW (ref >90)
GFR, EST AFRICAN AMERICAN: 88 mL/min — AB (ref >90)
Glucose, Bld: 111 mg/dL — ABNORMAL HIGH (ref 70–99)
POTASSIUM: 4 meq/L (ref 3.7–5.3)
SODIUM: 139 meq/L (ref 137–147)

## 2014-01-18 LAB — TROPONIN I
TROPONIN I: 0.57 ng/mL — AB (ref <0.30)
TROPONIN I: 0.37 ng/mL — AB (ref <0.30)
Troponin I: 0.45 ng/mL (ref <0.30)

## 2014-01-18 LAB — HEPARIN LEVEL (UNFRACTIONATED)
HEPARIN UNFRACTIONATED: 0.82 [IU]/mL — AB (ref 0.30–0.70)
Heparin Unfractionated: 0.74 IU/mL — ABNORMAL HIGH (ref 0.30–0.70)

## 2014-01-18 LAB — MRSA PCR SCREENING: MRSA BY PCR: POSITIVE — AB

## 2014-01-18 LAB — CBC
HEMATOCRIT: 31.9 % — AB (ref 36.0–46.0)
HEMOGLOBIN: 10.2 g/dL — AB (ref 12.0–15.0)
MCH: 26.2 pg (ref 26.0–34.0)
MCHC: 32 g/dL (ref 30.0–36.0)
MCV: 81.8 fL (ref 78.0–100.0)
Platelets: 308 10*3/uL (ref 150–400)
RBC: 3.9 MIL/uL (ref 3.87–5.11)
RDW: 15 % (ref 11.5–15.5)
WBC: 8.7 10*3/uL (ref 4.0–10.5)

## 2014-01-18 LAB — ANTITHROMBIN III: ANTITHROMB III FUNC: 68 % — AB (ref 75–120)

## 2014-01-18 LAB — HOMOCYSTEINE: Homocysteine: 6.2 umol/L (ref 4.0–15.4)

## 2014-01-18 MED ORDER — CHLORHEXIDINE GLUCONATE CLOTH 2 % EX PADS
6.0000 | MEDICATED_PAD | Freq: Every day | CUTANEOUS | Status: AC
  Administered 2014-01-19 – 2014-01-22 (×4): 6 via TOPICAL

## 2014-01-18 MED ORDER — ALBUTEROL SULFATE (2.5 MG/3ML) 0.083% IN NEBU
2.5000 mg | INHALATION_SOLUTION | Freq: Four times a day (QID) | RESPIRATORY_TRACT | Status: DC | PRN
  Administered 2014-01-23 – 2014-01-27 (×4): 2.5 mg via RESPIRATORY_TRACT
  Filled 2014-01-18 (×4): qty 3

## 2014-01-18 MED ORDER — APIXABAN 5 MG PO TABS: 5.0000 mg | ORAL_TABLET | Freq: Two times a day (BID) | ORAL | Status: DC

## 2014-01-18 MED ORDER — MUPIROCIN 2 % EX OINT
1.0000 "application " | TOPICAL_OINTMENT | Freq: Two times a day (BID) | CUTANEOUS | Status: AC
  Administered 2014-01-18 – 2014-01-22 (×9): 1 via NASAL
  Filled 2014-01-18 (×2): qty 22

## 2014-01-18 MED ORDER — GABAPENTIN 400 MG PO CAPS
400.0000 mg | ORAL_CAPSULE | Freq: Every day | ORAL | Status: DC
  Administered 2014-01-18 – 2014-02-01 (×15): 400 mg via ORAL
  Filled 2014-01-18 (×15): qty 1

## 2014-01-18 MED ORDER — APIXABAN 5 MG PO TABS
10.0000 mg | ORAL_TABLET | Freq: Two times a day (BID) | ORAL | Status: DC
  Administered 2014-01-18 – 2014-01-21 (×6): 10 mg via ORAL
  Filled 2014-01-18 (×10): qty 2

## 2014-01-18 NOTE — Progress Notes (Signed)
MD called and notified of Vascular Lab findings r/t bilateral DVT's. No new orders received. Pt remains on bedrest and following current plan of care.

## 2014-01-18 NOTE — Progress Notes (Signed)
ANTICOAGULATION CONSULT NOTE - Follow Up Consult  Pharmacy Consult for heparin Indication: pulmonary embolus  Labs:  Recent Labs  01/17/14 2002 01/17/14 2006 01/17/14 2045 01/17/14 2345 01/18/14 0525  HGB 12.5  --   --   --   --   HCT 37.4  --   --   --   --   PLT 380  --   --   --   --   APTT 31  --   --   --   --   LABPROT 14.6  --   --   --   --   INR 1.16  --   --   --   --   HEPARINUNFRC  --   --   --   --  0.82*  CREATININE  --  0.89  --   --   --   TROPONINI 0.46*  --  0.33* 0.45*  --     Assessment: 80yo female supratherapeutic on heparin with initial dosing for PE.  Goal of Therapy:  Heparin level 0.3-0.7 units/ml   Plan:  Will decrease heparin gtt by 1 unit/kg/hr to 1100 units/hr and check level in [place], PharmD, BCPS  01/18/2014,6:30 AM

## 2014-01-18 NOTE — Progress Notes (Signed)
[facility] Progress Note [place] TEAM 1 - Stepdown/ICU TEAM   Crystal [name] DGU:[id number] DOB: Nov 04, 1925 DOA: 01/17/2014 PCP: Mathews Argyle, MD  Brief narrative: Crystal [name] is a 80 y.o. female presenting on 01/17/2014 with  has a past medical history of Hypertension; Arthritis; GERD (gastroesophageal reflux disease); Polio; Hyperthyroidism; Peripheral neuropathy; Shingles; IBS (irritable bowel syndrome); Allergic sinusitis; Asthma; Diverticulosis; Benign tumor of pituitary gland (1999); IBS (irritable bowel syndrome);  and Uterine cancer who presents with a complaint of dyspnea on exertion and extreme fatigue for 1 wk now. She also c/o chest tightness on admission. She was found to have extensive PEs per CT scan.    Subjective: Continues to feel exhausted. No other complaints. Noted to have swollen right leg- had venous duplex 1/28 (in Epic) which was negative for EVT.   Assessment/Plan: Principal Problem:   Bilateral pulmonary embolism - appreciate pulm f/u - pt on heparin and has agreed to treatment long term with Xarelto or Eliquis- per Pulm, this will need to be life long - have also ordered a hypercoag panel  Active Problems:  DVT - b/l post tibial  Hypotension with h/o HTN - hold antihypertensives  GERD/ Diverticulosis - f/u stool hemoccults  Hypothyroid - cont Synthroid     Code Status: Full code Family Communication: none Disposition Plan: follow in SDU Pt eval tomorrow  Consultants: PCCM  Procedures: none  Antibiotics: none  DVT prophylaxis: Heparin infusion  Objective: Filed Weights   01/18/14 0155  Weight: 88 kg (194 lb 0.1 oz)   Blood pressure 109/47, pulse 108, temperature 98.2 F (36.8 C), temperature source Oral, resp. rate 19, height 5\' 4"  (1.626 m), weight 88 kg (194 lb 0.1 oz), SpO2 95.00%.  Intake/Output Summary (Last 24 hours) at 01/18/14 1624 Last data filed at 01/18/14 1400  Gross per 24 hour  Intake     622 ml  Output    425 ml  Net    197 ml     Exam: General: No acute respiratory distress Lungs: Clear to auscultation bilaterally without wheezes or crackles Cardiovascular: Regular rate and rhythm without murmur gallop or rub normal S1 and S2 Abdomen: Nontender, nondistended, soft, bowel sounds positive, no rebound, no ascites, no appreciable mass Extremities: No significant cyanosis, clubbing- + edema of right leg  Data Reviewed: Basic Metabolic Panel:  Recent Labs Lab 01/17/14 2006 01/18/14 0535  NA 135* 139  K 4.3 4.0  CL 97 104  CO2 23 23  GLUCOSE 126* 111*  BUN 25* 20  CREATININE 0.89 0.70  CALCIUM 9.2 8.1*   Liver Function Tests:  Recent Labs Lab 01/17/14 2006  AST 26  ALT 17  ALKPHOS 104  BILITOT 0.4  PROT 6.8  ALBUMIN 3.3*   No results found for this basename: LIPASE, AMYLASE,  in the last 168 hours No results found for this basename: AMMONIA,  in the last 168 hours CBC:  Recent Labs Lab 01/17/14 2002 01/18/14 0535  WBC 12.0* 8.7  HGB 12.5 10.2*  HCT 37.4 31.9*  MCV 81.5 81.8  PLT 380 308   Cardiac Enzymes:  Recent Labs Lab 01/17/14 2002 01/17/14 2045 01/17/14 2345 01/18/14 0545 01/18/14 1130  TROPONINI 0.46* 0.33* 0.45* 0.57* 0.37*   BNP (last 3 results)  Recent Labs  01/17/14 2002  PROBNP 604.6*   CBG: No results found for this basename: GLUCAP,  in the last 168 hours  Recent Results (from the past 240 hour(s))  MRSA PCR SCREENING  Status: Abnormal   Collection Time    01/18/14  1:50 AM      Result Value Ref Range Status   MRSA by PCR POSITIVE (*) NEGATIVE Final   Comment:            The GeneXpert MRSA Assay (FDA     approved for NASAL specimens     only), is one component of a     comprehensive MRSA colonization     surveillance program. It is not     intended to diagnose MRSA     infection nor to guide or     monitor treatment for     MRSA infections.     RESULT CALLED TO, READ BACK BY AND VERIFIED WITH:      MUELLER,R RN 1610 01/18/14 MITCHELL,L     Studies:  Recent x-ray studies have been reviewed in detail by the Attending Physician  Scheduled Meds:  Scheduled Meds: . [START ON 01/21/2014] cabergoline  0.25 mg Oral Q14 Days  . calcium carbonate  1 tablet Oral BID WC  . Chlorhexidine Gluconate Cloth  6 each Topical Q0600  . fluticasone  1 puff Inhalation BID  . gabapentin  400 mg Oral Daily  . gabapentin  800 mg Oral QHS  . levothyroxine  100 mcg Oral QAC breakfast  . mirtazapine  60 mg Oral QHS  . mupirocin ointment  1 application Nasal BID  . pantoprazole  40 mg Oral Daily  . pramipexole  0.75 mg Oral QHS  . sodium chloride  3 mL Intravenous Q12H   Continuous Infusions: . heparin 1,000 Units/hr (01/18/14 1507)    Time spent on care of this patient: >35 min  Debbe Odea, MD  [facility] Office  [phone] Pager - Text Page per Shea Evans as per below:  On-Call/Text Page:      [email or web address]      password [email or web address]  If 7PM-7AM, please contact night-coverage [email or web address] Password [email or web address] 01/18/2014, 4:24 PM   LOS: 1 day

## 2014-01-18 NOTE — Progress Notes (Signed)
Transferred in from 2300 by bed awake and alert. Noted with shortness of breath on exertion

## 2014-01-18 NOTE — Progress Notes (Signed)
  Echocardiogram 2D Echocardiogram has been performed.  Crystal [name] FRANCES 01/18/2014, 12:19 PM

## 2014-01-18 NOTE — Progress Notes (Signed)
Pt admitted from E.D. via hospital stretcher w/ O2, monitor and RN; Pt assisted to hospital bed and attached to unit monitors and O2; Pt introduced to staff and unit, MRSA swab done and 6 cloth CHG bath complete. Questions answered and support give.

## 2014-01-18 NOTE — Progress Notes (Signed)
Utilization Review Completed.  

## 2014-01-18 NOTE — Progress Notes (Signed)
No new issues or complaints  PHYSICAL EXAMINATION:  General: Awake, no distress.  Neuro: No focal def  HEENT: NCAT, no JVD  Cardiovascular: rrr  Lungs: CTA no accessory muscle use  Abdomen: NT, No OM, + bs  Musculoskeletal: Intact  Skin: R>L LE swelling    I have reviewed all of today's lab results. Relevant abnormalities are discussed in the A/P section  No new CXR  IMP: Submassive PE Acute DVT HDly stable  PLAN/REC: Cont heparin Transition to warfarin vs Xarelto Lifelong anticoagulation probably warranted Hypercoag panel sent but results probably should not alter above recs  PCCM will sign off. Please call if we can be of further assistance  Merton Border, MD ; [facility] [phone].  After 5:30 PM or weekends, call [phone]

## 2014-01-18 NOTE — Progress Notes (Signed)
Report called to receiving RN, pt transported safely. Pt has no complaints of pain, vs WNL. Bedside report given, ELINK notified of tx. Pt comfortable, resting, GCS 15.

## 2014-01-18 NOTE — Progress Notes (Signed)
Bilateral lower extremity venous duplex completed.  Bilateral:  DVT noted in the posterior tibial vein.  Can not rule out DVT in the peroneal vein due to the patient's guarding secondary to her pain.  No evidence of superficial thrombosis.  No Baker's cyst.

## 2014-01-18 NOTE — Progress Notes (Signed)
ANTICOAGULATION CONSULT NOTE - Follow Up Consult  Pharmacy Consult for heparin Indication: pulmonary embolus  Labs:  Recent Labs  01/17/14 2002 01/17/14 2006  01/17/14 2345 01/18/14 0525 01/18/14 0535 01/18/14 0545 01/18/14 1130 01/18/14 1325  HGB 12.5  --   --   --   --  10.2*  --   --   --   HCT 37.4  --   --   --   --  31.9*  --   --   --   PLT 380  --   --   --   --  308  --   --   --   APTT 31  --   --   --   --   --   --   --   --   LABPROT 14.6  --   --   --   --   --   --   --   --   INR 1.16  --   --   --   --   --   --   --   --   HEPARINUNFRC  --   --   --   --  0.82*  --   --   --  0.74*  CREATININE  --  0.89  --   --   --  0.70  --   --   --   TROPONINI 0.46*  --   < > 0.45*  --   --  0.57* 0.37*  --   < > = values in this interval not displayed.  Assessment: 80yo female on heparin for PE and acute DVT. Heparin level slightly supratherapeutic on 1100 units/hr. Hgb down to 10.2 - will watch. FOBT pending. Plt ok. No bleeding noted.  Goal of Therapy:  Heparin level 0.3-0.7 units/ml   Plan:  1) Decrease heparin gtt to 1000 units/hr. 2) Will recheck heparin level in 8 hours  Sherlon Handing, PharmD, BCPS Clinical pharmacist, pager [phone] 01/18/2014,3:03 PM

## 2014-01-18 NOTE — Progress Notes (Signed)
ANTICOAGULATION CONSULT NOTE - Follow Up Consult  Pharmacy Consult for apixaban Indication: pulmonary embolus and DVT  Allergies  Allergen Reactions  . Clinoril [Sulindac] Rash  . Erythromycin Rash  . Penicillins Rash  . Prozac [Fluoxetine Hcl] Rash  . Sulfa Antibiotics Rash    Patient Measurements: Height: 5\' 4"  (162.6 cm) Weight: 194 lb 0.1 oz (88 kg) IBW/kg (Calculated) : 54.7  Vital Signs: Temp: 98.2 F (36.8 C) (02/17 1137) Temp src: Oral (02/17 1137) BP: 105/78 mmHg (02/17 1600) Pulse Rate: 96 (02/17 1600)  Labs:  Recent Labs  01/17/14 2002 01/17/14 2006  01/17/14 2345 01/18/14 0525 01/18/14 0535 01/18/14 0545 01/18/14 1130 01/18/14 1325  HGB 12.5  --   --   --   --  10.2*  --   --   --   HCT 37.4  --   --   --   --  31.9*  --   --   --   PLT 380  --   --   --   --  308  --   --   --   APTT 31  --   --   --   --   --   --   --   --   LABPROT 14.6  --   --   --   --   --   --   --   --   INR 1.16  --   --   --   --   --   --   --   --   HEPARINUNFRC  --   --   --   --  0.82*  --   --   --  0.74*  CREATININE  --  0.89  --   --   --  0.70  --   --   --   TROPONINI 0.46*  --   < > 0.45*  --   --  0.57* 0.37*  --   < > = values in this interval not displayed.  Estimated Creatinine Clearance: 54.2 ml/min (by C-G formula based on Cr of 0.7).  Assessment: 80 y/o female on heparin for bilateral PE and DVT to transition to apixaban for life long anticoagulation. Pharmacy consulted to dose apixaban. No bleeding noted, Hb down to 10.2 - will watch, platelets are stable.  Goal of Therapy:  Full anticoagulation Monitor platelets by anticoagulation protocol: Yes   Plan:  -Discontinue heparin drip at the time of the first apixaban dose -Apixaban 10 mg PO bid for 15 doses (7 days plus one dose tonight) then 5 mg PO bid -CBC q72h -Monitor for s/sx of bleeding  [facility], Pharm.D., BCPS Clinical Pharmacist Pager: [phone] 01/18/2014 4:47 PM

## 2014-01-18 NOTE — Progress Notes (Signed)
Heparin gtt d/ced after eliquis 10 mg  tab given.

## 2014-01-19 LAB — LUPUS ANTICOAGULANT PANEL
DRVVT: 42.7 s
Lupus Anticoagulant: NOT DETECTED
PTT Lupus Anticoagulant: 200 s — ABNORMAL HIGH (ref 28.0–43.0)
PTTLA 4:1 Mix: 194.5 s — ABNORMAL HIGH (ref 28.0–43.0)
PTTLA Confirmation: 0.1 s

## 2014-01-19 LAB — CARDIOLIPIN ANTIBODIES, IGG, IGM, IGA
Anticardiolipin IgA: 8 U/mL — ABNORMAL LOW
Anticardiolipin IgG: 12 GPL U/mL
Anticardiolipin IgM: 15 [MPL'U]/mL — ABNORMAL HIGH

## 2014-01-19 LAB — BETA-2-GLYCOPROTEIN I ABS, IGG/M/A
Beta-2 Glyco I IgG: 13 G Units
Beta-2-Glycoprotein I IgA: 5 A Units
Beta-2-Glycoprotein I IgM: 29 M Units — ABNORMAL HIGH

## 2014-01-19 LAB — PROTEIN C ACTIVITY: Protein C Activity: 91 % (ref 75–133)

## 2014-01-19 LAB — PROTEIN S ACTIVITY: PROTEIN S ACTIVITY: 69 % (ref 69–129)

## 2014-01-19 LAB — PROTEIN C, TOTAL: Protein C, Total: 65 % — ABNORMAL LOW (ref 72–160)

## 2014-01-19 LAB — PROTHROMBIN GENE MUTATION

## 2014-01-19 LAB — PROTEIN S, TOTAL: PROTEIN S AG TOTAL: 67 % (ref 60–150)

## 2014-01-19 LAB — MAGNESIUM: Magnesium: 1.7 mg/dL (ref 1.5–2.5)

## 2014-01-19 NOTE — Discharge Instructions (Addendum)

## 2014-01-19 NOTE — Care Management Note (Addendum)
    Page 1 of 2   02/01/2014     4:10:17 PM   CARE MANAGEMENT NOTE 02/01/2014  Patient:  AZRIELLE, SPRINGSTEEN   Account Number:  [id number]  Date Initiated:  01/18/2014  Documentation initiated by:  [facility]  Subjective/Objective Assessment:   Bilateral PE's - on heparin.     Action/Plan:   Anticipated DC Date:  02/01/2014   Anticipated DC Plan:  SKILLED NURSING FACILITY  In-house referral  Clinical Social Worker      DC Planning Services  CM consult      Choice offered to / List presented to:             Status of service:  Completed, signed off Medicare Important Message given?   (If response is "NO", the following Medicare IM given date fields will be blank) Date Medicare IM given:   Date Additional Medicare IM given:    Discharge Disposition:  [place]  Per UR Regulation:  Reviewed for med. necessity/level of care/duration of stay  If discussed at Agenda of Stay Meetings, dates discussed:   02/01/2014    Comments:  Contact:  [name] [phone]  02/01/14 Rhianon Zabawa,RN,BSN [phone] [place] TO SNF TODAY, PER CSW ARRANGEMENTS.  01/28/14 Tashala Cumbo,RN,BSN [phone] P.T. RECOMMENDING SNF AT DC;  CSW CONT TO FOLLOW TO FACILITATE DC TO SNF WHEN MEDICALLY STABLE.  2/18 1014a debbie dowell rn,bsn pt was given eliquis 30day free card. pt will have 45.00 per month copay for eliquis. requires prior auth. md can call [phone] or fill out optum rx prior auth form. form placed on shadow chart if md would like.

## 2014-01-19 NOTE — Progress Notes (Signed)
Progress Note  TEAM 1 - Stepdown/ICU TEAM   Crystal [name] OXB:[id number] DOB: Nov 26, 1925 DOA: 01/17/2014 PCP: Mathews Argyle, MD  Brief narrative: 80 y.o. female admitted on 01/17/2014 with a past medical history of Hypertension; Arthritis; GERD; Polio; Hyperthyroidism; Peripheral neuropathy; Shingles; IBS; Allergic sinusitis; Asthma; Diverticulosis; Benign tumor of pituitary gland (1999); IBS;  and Uterine cancer who presented with a complaint of dyspnea on exertion and extreme fatigue for 1 wk now. She also c/o chest tightness on admission. She was found to have extensive PEs per CT scan.   Subjective: No new complaints today, but severe DOE persists, and pt notes lethargy even when resting.  No cp at present.  No abdom pain, f/c, n/v.    Assessment/Plan:  Bilateral pulmonary emboli - no clear provoking event  - agree w/ Pulm that lifelong tx indicated - transitioned to Eliquis 2/17 but will need cotinued hospitalization until hypoxia and severe DOE improve - have asked CM to investigate ?need for pre-auth and cost of eliquis at home   B LE Posterior Tibial V DVT - anticoag as noted above   Hypotension with h/o HTN - hold antihypertensives  COPD No longer smoking - well compensated at present   GERD/ Diverticulosis - f/u stool hemoccults  Hypothyroid - cont Synthroid   History of polio with right upper extremity weakness  Code Status: FULL Family Communication: none Disposition Plan: stable for transfer to tele bed - begin PT/OT - may need CIR or even SNF stay - may require home O2 temporarily   Consultants: PCCM  Procedures: TTE - EF 65-70% - no WMA - RV systolic press increased w/ mod to severely reduced RV systolic fxn  B LE Venous duplex - acute deep vein thrombosis involving the right posterior tibial vein - acute deep vein thrombosis involving the left posterior tibial vein  Antibiotics: none  DVT  prophylaxis: Eliquis  Objective: Filed Weights   01/18/14 0155  Weight: 88 kg (194 lb 0.1 oz)   Blood pressure 105/58, pulse 77, temperature 97 F (36.1 C), temperature source Oral, resp. rate 18, height 5\' 4"  (1.626 m), weight 88 kg (194 lb 0.1 oz), SpO2 95.00%.  Intake/Output Summary (Last 24 hours) at 01/19/14 1038 Last data filed at 01/19/14 0800  Gross per 24 hour  Intake    295 ml  Output    325 ml  Net    -30 ml   Exam: General: No acute respiratory distress when in bed resting  Lungs: Clear to auscultation bilaterally without wheezes or crackles Cardiovascular: Regular rate and rhythm without murmur gallop or rub  Abdomen: Nontender, nondistended, soft, bowel sounds positive, no rebound, no ascites, no appreciable mass Extremities: No significant cyanosis, clubbing - 1+ edema of right leg w/ no edema L leg   Data Reviewed: Basic Metabolic Panel:  Recent Labs Lab 01/17/14 2006 01/18/14 0535 01/19/14 0236  NA 135* 139  --   K 4.3 4.0  --   CL 97 104  --   CO2 23 23  --   GLUCOSE 126* 111*  --   BUN 25* 20  --   CREATININE 0.89 0.70  --   CALCIUM 9.2 8.1*  --   MG  --   --  1.7   Liver Function Tests:  Recent Labs Lab 01/17/14 2006  AST 26  ALT 17  ALKPHOS 104  BILITOT 0.4  PROT 6.8  ALBUMIN 3.3*   CBC:  Recent Labs Lab 01/17/14 2002 01/18/14 0535  WBC 12.0* 8.7  HGB 12.5 10.2*  HCT 37.4 31.9*  MCV 81.5 81.8  PLT 380 308   Cardiac Enzymes:  Recent Labs Lab 01/17/14 2002 01/17/14 2045 01/17/14 2345 01/18/14 0545 01/18/14 1130  TROPONINI 0.46* 0.33* 0.45* 0.57* 0.37*   BNP (last 3 results)  Recent Labs  01/17/14 2002  PROBNP 604.6*    Recent Results (from the past 240 hour(s))  MRSA PCR SCREENING     Status: Abnormal   Collection Time    01/18/14  1:50 AM      Result Value Ref Range Status   MRSA by PCR POSITIVE (*) NEGATIVE Final   Comment:            The GeneXpert MRSA Assay (FDA     approved for NASAL specimens      only), is one component of a     comprehensive MRSA colonization     surveillance program. It is not     intended to diagnose MRSA     infection nor to guide or     monitor treatment for     MRSA infections.     RESULT CALLED TO, READ BACK BY AND VERIFIED WITH:     MUELLER,R RN [phone] 01/18/14 MITCHELL,L     Studies:  Recent x-ray studies have been reviewed in detail by the Attending Physician  Scheduled Meds:  Scheduled Meds: . apixaban  10 mg Oral Q12H   Followed by  . [START ON 01/26/2014] apixaban  5 mg Oral Q12H  . [START ON 01/21/2014] cabergoline  0.25 mg Oral Q14 Days  . calcium carbonate  1 tablet Oral BID WC  . Chlorhexidine Gluconate Cloth  6 each Topical Q0600  . fluticasone  1 puff Inhalation BID  . gabapentin  400 mg Oral Daily  . gabapentin  800 mg Oral QHS  . levothyroxine  100 mcg Oral QAC breakfast  . mirtazapine  60 mg Oral QHS  . mupirocin ointment  1 application Nasal BID  . pantoprazole  40 mg Oral Daily  . pramipexole  0.75 mg Oral QHS  . sodium chloride  3 mL Intravenous Q12H    Time spent on care of this patient: 35 mins  MCCLUNG,JEFFREY T, MD  [facility] Office  [phone] Pager - Text Page per Shea Evans as per below:  On-Call/Text Page:      [email or web address]      password [email or web address]  If 7PM-7AM, please contact night-coverage [email or web address] Password [email or web address] 01/19/2014, 10:38 AM   LOS: 2 days

## 2014-01-19 NOTE — Evaluation (Signed)
Physical Therapy Evaluation Patient Details Name: Crystal [name] MRN: [id number] DOB: 28-Nov-1925 Today's Date: 01/19/2014 Time: 1720-1746 PT Time Calculation (min): 26 min  PT Assessment / Plan / Recommendation History of Present Illness  80 y.o. female admitted on 01/17/2014 with a past medical history of Hypertension; Arthritis; GERD; Polio; Hyperthyroidism; Peripheral neuropathy; Shingles; IBS; Allergic sinusitis; Asthma; Diverticulosis; Benign tumor of pituitary gland (1999); IBS;  and Uterine cancer who presented with a complaint of dyspnea on exertion and extreme fatigue for 1 wk now. She also c/o chest tightness on admission. She was found to have extensive PEs per CT scan  Clinical Impression  Pt admitted with SOB, found to be due to extensive bil PE.  Pt currently limited functionally due to the problems listed. ( See problems list.)   Pt will benefit from PT to maximize function and safety in order to get ready for next venue listed below.     PT Assessment  Patient needs continued PT services    Follow Up Recommendations  SNF    Does the patient have the potential to tolerate intense rehabilitation      Barriers to Discharge        Equipment Recommendations  Other (comment) (TBA)    Recommendations for Other Services     Frequency Min 3X/week    Precautions / Restrictions Precautions Precautions: Fall   Pertinent Vitals/Pain With ambulation sats on 3L [place] dropped to 87%, EHR up to mid 120's bpm, but with quick recovery once sitting.      Mobility  Bed Mobility Overal bed mobility: Needs Assistance Bed Mobility: Supine to Sit;Sit to Supine Supine to sit: Supervision;HOB elevated (via R elbow  with rail) Sit to supine: Supervision General bed mobility comments: pt got out on the side she would usually not get up on.  Needed rail but no asssist Transfers Overall transfer level: Needs assistance Transfers: Sit to/from Stand Sit to Stand: Min assist General  transfer comment: stability assist Ambulation/Gait Ambulation/Gait assistance: Min assist Ambulation Distance (Feet): 10 Feet Assistive device: 1 person hand held assist Gait Pattern/deviations: Step-through pattern;Wide base of support General Gait Details: mildly staggered steps with pt quick to get dyspneic needing to return to bed    Exercises     PT Diagnosis: Difficulty walking;Generalized weakness  PT Problem List: Decreased strength;Decreased activity tolerance;Decreased balance;Decreased mobility;Cardiopulmonary status limiting activity;Decreased cognition PT Treatment Interventions: DME instruction;Gait training;Therapeutic activities;Functional mobility training;Balance training;Patient/family education     PT Goals(Current goals can be found in the care plan section) Acute Rehab PT Goals Patient Stated Goal: to finally get back home when I can. PT Goal Formulation: With patient Time For Goal Achievement: 02/02/14 Potential to Achieve Goals: Good  Visit Information  Last PT Received On: 01/19/14 Assistance Needed: +1 History of Present Illness: 80 y.o. female admitted on 01/17/2014 with a past medical history of Hypertension; Arthritis; GERD; Polio; Hyperthyroidism; Peripheral neuropathy; Shingles; IBS; Allergic sinusitis; Asthma; Diverticulosis; Benign tumor of pituitary gland (1999); IBS;  and Uterine cancer who presented with a complaint of dyspnea on exertion and extreme fatigue for 1 wk now. She also c/o chest tightness on admission. She was found to have extensive PEs per CT scan       Prior [place] expects to be discharged to:: Skilled nursing facility Living Arrangements: Alone (son doesn't work and might be able to stay with her) Available Help at Discharge: Other (Comment) (maybe son) Type of Home: House Home Access: Stairs to enter Home  Layout: One level Home Equipment: Cane - single point Prior Function Level of  Independence: Independent Communication Communication: No difficulties    Cognition  Cognition Arousal/Alertness: Awake/alert Behavior During Therapy: WFL for tasks assessed/performed Overall Cognitive Status: Within Functional Limits for tasks assessed    Extremity/Trunk Assessment Upper Extremity Assessment Upper Extremity Assessment: LUE deficits/detail;RUE deficits/detail RUE Deficits / Details: profoundly weak from polio LUE Deficits / Details: grossly 4/5 Lower Extremity Assessment Lower Extremity Assessment: RLE deficits/detail;LLE deficits/detail RLE Deficits / Details: grossly 4/5 except df 2+5 LLE Deficits / Details: grossly 4/5   Balance Balance Overall balance assessment: Needs assistance Sitting-balance support: No upper extremity supported;Feet supported Sitting balance-Leahy Scale: Fair  End of Session PT - End of Session Equipment Utilized During Treatment: Oxygen Activity Tolerance: Patient tolerated treatment well;Other (comment) (SOB limited time of mobility) Patient left: in bed;with call bell/phone within reach Nurse Communication: Mobility status  GP     Salvadore Valvano, Tessie Fass 01/19/2014, 6:02 PM 01/19/2014  Donnella Sham, [place] [phone]  (pager)

## 2014-01-20 DIAGNOSIS — I959 Hypotension, unspecified: Secondary | ICD-10-CM

## 2014-01-20 LAB — CBC
HCT: 30.2 % — ABNORMAL LOW (ref 36.0–46.0)
Hemoglobin: 9.5 g/dL — ABNORMAL LOW (ref 12.0–15.0)
MCH: 26 pg (ref 26.0–34.0)
MCHC: 31.5 g/dL (ref 30.0–36.0)
MCV: 82.7 fL (ref 78.0–100.0)
PLATELETS: 255 10*3/uL (ref 150–400)
RBC: 3.65 MIL/uL — ABNORMAL LOW (ref 3.87–5.11)
RDW: 15.2 % (ref 11.5–15.5)
WBC: 6.6 10*3/uL (ref 4.0–10.5)

## 2014-01-20 MED ORDER — POLYETHYLENE GLYCOL 3350 17 G PO PACK
17.0000 g | PACK | Freq: Every day | ORAL | Status: DC
  Administered 2014-01-20: 17 g via ORAL
  Filled 2014-01-20 (×3): qty 1

## 2014-01-20 MED ORDER — SENNA 8.6 MG PO TABS
1.0000 | ORAL_TABLET | Freq: Every day | ORAL | Status: DC | PRN
  Administered 2014-01-23 – 2014-01-29 (×2): 8.6 mg via ORAL
  Filled 2014-01-20 (×2): qty 1

## 2014-01-20 MED ORDER — MOMETASONE FURO-FORMOTEROL FUM 100-5 MCG/ACT IN AERO
2.0000 | INHALATION_SPRAY | Freq: Two times a day (BID) | RESPIRATORY_TRACT | Status: DC
  Administered 2014-01-20 – 2014-02-01 (×21): 2 via RESPIRATORY_TRACT
  Filled 2014-01-20 (×4): qty 8.8

## 2014-01-20 MED ORDER — LEVOFLOXACIN 500 MG PO TABS
500.0000 mg | ORAL_TABLET | Freq: Every day | ORAL | Status: DC
  Administered 2014-01-20 – 2014-01-24 (×5): 500 mg via ORAL
  Filled 2014-01-20 (×6): qty 1

## 2014-01-20 MED ORDER — DOCUSATE SODIUM 100 MG PO CAPS
100.0000 mg | ORAL_CAPSULE | Freq: Two times a day (BID) | ORAL | Status: DC
  Administered 2014-01-20 – 2014-02-01 (×24): 100 mg via ORAL
  Filled 2014-01-20 (×26): qty 1

## 2014-01-20 NOTE — Progress Notes (Addendum)
[facility] PROGRESS NOTE  Crystal [name] UKG:[id number] DOB: [birth year] DOA: 01/17/2014 PCP: Mathews Argyle, MD  Assessment/Plan: 80 y.o. female admitted on 01/17/2014 with a past medical history of Hypertension; Arthritis; GERD; Polio; Hyperthyroidism; Peripheral neuropathy; Shingles; IBS; Allergic sinusitis; Asthma; Diverticulosis; Benign tumor of pituitary gland (1999); IBS; and Uterine cancer who presented with a complaint of dyspnea on exertion and extreme fatigue for 1 wk now. She also c/o chest tightness on admission. She was found to have extensive PEs per CT scan.   1. Bilateral pulmonary emboli; patient is evaluated by Pulm recommended  lifelong AC tx  -she was on heparin --> transitioned to Eliquis 2/17  -echo: LVEF 65%, reduced RV function; pulmonary HTN  -no s/s of fluids overload; monitor off diuretics  2. B LE Posterior Tibial V DVT anticoag as noted above  3. Hypotension with h/o HTN hold antihypertensives  4. COPD No longer smoking , reports cough, cont bronchodilators, add advair, levofloxacin few days   5. GERD/ Diverticulosis  f/u stool hemoccults  6. Hypothyroid cont Synthroid  7. History of polio with right upper extremity weakness -PT recommended SNF  8. Acute anemia; no s/s of acute bleeding; check occult blood; recheck CBC in AM   Prognosis guarded   Code Status: full Family Communication: d/w patient, updated [name] [phone]  (indicate person spoken with, relationship, and if by phone, the number) Disposition Plan: SNF when bed avail;abel    Consultants:  Pulmonary   Procedures:  Non e  Antibiotics:  Levofloxacin 2/19 (indicate start date, and stop date if known)  HPI/Subjective: alert  Objective: Filed Vitals:   01/20/14 0509  BP: 135/57  Pulse: 89  Temp: 98.8 F (37.1 C)  Resp: 20    Intake/Output Summary (Last 24 hours) at 01/20/14 0812 Last data filed at 01/19/14 1700  Gross per 24 hour  Intake    360  ml  Output    200 ml  Net    160 ml   Filed Weights   01/18/14 0155  Weight: 88 kg (194 lb 0.1 oz)    Exam:   General:  alert  Cardiovascular: s1,s2 rrr  Respiratory: CTA BL  Abdomen: soft, nt, nd   Musculoskeletal: no LE edema   Data Reviewed: Basic Metabolic Panel:  Recent Labs Lab 01/17/14 2006 01/18/14 0535 01/19/14 0236  NA 135* 139  --   K 4.3 4.0  --   CL 97 104  --   CO2 23 23  --   GLUCOSE 126* 111*  --   BUN 25* 20  --   CREATININE 0.89 0.70  --   CALCIUM 9.2 8.1*  --   MG  --   --  1.7   Liver Function Tests:  Recent Labs Lab 01/17/14 2006  AST 26  ALT 17  ALKPHOS 104  BILITOT 0.4  PROT 6.8  ALBUMIN 3.3*   No results found for this basename: LIPASE, AMYLASE,  in the last 168 hours No results found for this basename: AMMONIA,  in the last 168 hours CBC:  Recent Labs Lab 01/17/14 2002 01/18/14 0535  WBC 12.0* 8.7  HGB 12.5 10.2*  HCT 37.4 31.9*  MCV 81.5 81.8  PLT 380 308   Cardiac Enzymes:  Recent Labs Lab 01/17/14 2002 01/17/14 2045 01/17/14 2345 01/18/14 0545 01/18/14 1130  TROPONINI 0.46* 0.33* 0.45* 0.57* 0.37*   BNP (last 3 results)  Recent Labs  01/17/14 2002  PROBNP 604.6*   CBG: No results found for  this basename: GLUCAP,  in the last 168 hours  Recent Results (from the past 240 hour(s))  MRSA PCR SCREENING     Status: Abnormal   Collection Time    01/18/14  1:50 AM      Result Value Ref Range Status   MRSA by PCR POSITIVE (*) NEGATIVE Final   Comment:            The GeneXpert MRSA Assay (FDA     approved for NASAL specimens     only), is one component of a     comprehensive MRSA colonization     surveillance program. It is not     intended to diagnose MRSA     infection nor to guide or     monitor treatment for     MRSA infections.     RESULT CALLED TO, READ BACK BY AND VERIFIED WITHElmarie Mainland RN [phone] 01/18/14 MITCHELL,L     Studies: No results found.  Scheduled Meds: . apixaban  10  mg Oral Q12H   Followed by  . [START ON 01/26/2014] apixaban  5 mg Oral Q12H  . [START ON 01/21/2014] cabergoline  0.25 mg Oral Q14 Days  . calcium carbonate  1 tablet Oral BID WC  . Chlorhexidine Gluconate Cloth  6 each Topical Q0600  . fluticasone  1 puff Inhalation BID  . gabapentin  400 mg Oral Daily  . gabapentin  800 mg Oral QHS  . levothyroxine  100 mcg Oral QAC breakfast  . mirtazapine  60 mg Oral QHS  . mupirocin ointment  1 application Nasal BID  . pantoprazole  40 mg Oral Daily  . pramipexole  0.75 mg Oral QHS   Continuous Infusions:   Principal Problem:   Bilateral pulmonary embolism Active Problems:   PE (pulmonary embolism)   DVT (deep venous thrombosis)   Hypotension, unspecified    Time spent: >35 minute s    Kinnie Feil  [facility] Pager [phone]. If 7PM-7AM, please contact night-coverage at [email or web address], password [facility] 01/20/2014, 8:12 AM  LOS: 3 days

## 2014-01-20 NOTE — Progress Notes (Signed)
Clinical Social Work Department BRIEF PSYCHOSOCIAL ASSESSMENT 01/20/2014  Patient:  Crystal [name], Crystal [name]     Account Number:  [id number]     Admit date:  01/17/2014  Clinical Social Worker:  Megan Salon  Date/Time:  01/20/2014 02:43 PM  Referred by:  Physician  Date Referred:  01/20/2014 Referred for  SNF Placement   Other Referral:   Interview type:  Patient Other interview type:    PSYCHOSOCIAL DATA Living Status:  ALONE Admitted from facility:   Level of care:   Primary support name:  Bartolo Darter Primary support relationship to patient:  CHILD, ADULT Degree of support available:   Good    CURRENT CONCERNS Current Concerns  Post-Acute Placement   Other Concerns:    SOCIAL WORK ASSESSMENT / PLAN Clinical Social Worker received referral for SNF placement at d/c. CSW introduced self and explained reason for visit. Physical therapy was working with patient during visit. CSW explained SNF process  to patient. Patient reported she is agreeable for SNF placement in [facility]. CSW will complete FL2 for MD's signature and will update patient when bed offers are received.   Assessment/plan status:  Psychosocial Support/Ongoing Assessment of Needs Other assessment/ plan:   Information/referral to community resources:   SNF information    PATIENT'S/FAMILY'S RESPONSE TO PLAN OF CARE: Patient is agreeable to SNF placement and thinks that is best for her at this time.        Jeanette Caprice, MSW, [place]

## 2014-01-20 NOTE — Progress Notes (Signed)
Physical Therapy Treatment Patient Details Name: Crystal [name] MRN: [id number] DOB: [birth year] Today's Date: 01/20/2014 Time: 4098-1191 PT Time Calculation (min): 24 min  PT Assessment / Plan / Recommendation  History of Present Illness 80 y.o. female admitted on 01/17/2014 with a past medical history of Hypertension; Arthritis; GERD; Polio; Hyperthyroidism; Peripheral neuropathy; Shingles; IBS; Allergic sinusitis; Asthma; Diverticulosis; Benign tumor of pituitary gland (1999); IBS;  and Uterine cancer who presented with a complaint of dyspnea on exertion and extreme fatigue for 1 wk now. She also c/o chest tightness on admission. She was found to have extensive PEs per CT scan   PT Comments   Pt still too unsteady to be able to safely stay by herself.  She also needs oxygen at this point, but has never needed it before.  Some time in rehab will allow her more time to wean off O2.   Follow Up Recommendations  SNF     Does the patient have the potential to tolerate intense rehabilitation     Barriers to Discharge        Equipment Recommendations   (likely will need no equipment)    Recommendations for Other Services    Frequency Min 3X/week   Progress towards PT Goals Progress towards PT goals: Progressing toward goals  Plan Current plan remains appropriate    Precautions / Restrictions Precautions Precautions: Fall   Pertinent Vitals/Pain During gait on 3L [place]  sats were 91 to 92% with EHR up in the 120's and mildly dyspneic    Mobility  Transfers Overall transfer level: Needs assistance Transfers: Sit to/from Stand Sit to Stand: Min assist General transfer comment: stability assist after 3 tries to make it up Ambulation/Gait Ambulation/Gait assistance: Min assist Ambulation Distance (Feet): 150 Feet (with a long standing rest break about half way) Assistive device: 1 person hand held assist Gait Pattern/deviations: Step-through pattern (excessive lateral w/shift mostly  due to bil foot pronation) Gait velocity: slow Gait velocity interpretation: Below normal speed for age/gender General Gait Details: Even with shoes pt mildly unsteady, mostly due to severe pronation bil feet.    Exercises     PT Diagnosis:    PT Problem List:   PT Treatment Interventions:     PT Goals (current goals can now be found in the care plan section) Acute Rehab PT Goals PT Goal Formulation: With patient Time For Goal Achievement: 02/02/14 Potential to Achieve Goals: Good  Visit Information  Last PT Received On: 01/20/14 Assistance Needed: +1 History of Present Illness: 80 y.o. female admitted on 01/17/2014 with a past medical history of Hypertension; Arthritis; GERD; Polio; Hyperthyroidism; Peripheral neuropathy; Shingles; IBS; Allergic sinusitis; Asthma; Diverticulosis; Benign tumor of pituitary gland (1999); IBS;  and Uterine cancer who presented with a complaint of dyspnea on exertion and extreme fatigue for 1 wk now. She also c/o chest tightness on admission. She was found to have extensive PEs per CT scan    Subjective Data  Subjective: I really feel like I need to have some rehab before I get home.   Cognition  Cognition Arousal/Alertness: Awake/alert Behavior During Therapy: WFL for tasks assessed/performed Overall Cognitive Status: Within Functional Limits for tasks assessed    Balance  Balance Overall balance assessment: Needs assistance Sitting-balance support: Feet supported Sitting balance-Leahy Scale: Fair Postural control: Right lateral lean Standing balance support: Single extremity supported Standing balance-Leahy Scale: Fair  End of Session PT - End of Session Equipment Utilized During Treatment: Oxygen Activity Tolerance: Patient tolerated treatment well;Other (comment)  Patient left: in chair;with call bell/phone within reach Nurse Communication: Mobility status   GP     Tikita Mabee, Tessie Fass 01/20/2014, 2:27 PM 01/20/2014  Donnella Sham,  [place] [phone]  (pager)

## 2014-01-20 NOTE — Progress Notes (Signed)
Clinical Social Work Department CLINICAL SOCIAL WORK PLACEMENT NOTE 01/20/2014  Patient:  Crystal [name], Crystal [name]  Account Number:  [id number] [place] date:  01/17/2014  Clinical Social Worker:  Megan Salon  Date/time:  01/20/2014 02:46 PM  Clinical Social Work is seeking post-discharge placement for this patient at the following level of care:   [place]   (*CSW will update this form in Epic as items are completed)   01/20/2014  Patient/family provided with [place] Department of Clinical Social Work's list of facilities offering this level of care within the geographic area requested by the patient (or if unable, by the patient's family).  01/20/2014  Patient/family informed of their freedom to choose among providers that offer the needed level of care, that participate in Medicare, Medicaid or managed care program needed by the patient, have an available bed and are willing to accept the patient.  01/20/2014  Patient/family informed of [facility]' ownership interest in [facility], as well as of the fact that they are under no obligation to receive care at this facility.  PASARR submitted to EDS on 01/19/2014 PASARR number received from EDS on 01/19/2014  FL2 transmitted to all facilities in geographic area requested by pt/family on  01/20/2014 FL2 transmitted to all facilities within larger geographic area on   Patient informed that his/her managed care company has contracts with or will negotiate with  certain facilities, including the following:     Patient/family informed of bed offers received:   Patient chooses bed at  Physician recommends and patient chooses bed at    Patient to be transferred to  on   Patient to be transferred to facility by   The following physician request were entered in Epic:   Additional Comments:  Jeanette Caprice, MSW, [place]

## 2014-01-21 LAB — CBC
HCT: 26.3 % — ABNORMAL LOW (ref 36.0–46.0)
Hemoglobin: 8.3 g/dL — ABNORMAL LOW (ref 12.0–15.0)
MCH: 26.3 pg (ref 26.0–34.0)
MCHC: 31.6 g/dL (ref 30.0–36.0)
MCV: 83.2 fL (ref 78.0–100.0)
PLATELETS: 259 10*3/uL (ref 150–400)
RBC: 3.16 MIL/uL — ABNORMAL LOW (ref 3.87–5.11)
RDW: 15.5 % (ref 11.5–15.5)
WBC: 5.2 10*3/uL (ref 4.0–10.5)

## 2014-01-21 LAB — ABO/RH: ABO/RH(D): O NEG

## 2014-01-21 LAB — HEMOGLOBIN AND HEMATOCRIT, BLOOD
HCT: 26.6 % — ABNORMAL LOW (ref 36.0–46.0)
HCT: 27.1 % — ABNORMAL LOW (ref 36.0–46.0)
HEMOGLOBIN: 8.7 g/dL — AB (ref 12.0–15.0)
Hemoglobin: 8.6 g/dL — ABNORMAL LOW (ref 12.0–15.0)

## 2014-01-21 LAB — APTT: aPTT: 42 seconds — ABNORMAL HIGH (ref 24–37)

## 2014-01-21 LAB — FACTOR 5 LEIDEN

## 2014-01-21 LAB — OCCULT BLOOD X 1 CARD TO LAB, STOOL: Fecal Occult Bld: POSITIVE — AB

## 2014-01-21 LAB — PREPARE RBC (CROSSMATCH)

## 2014-01-21 LAB — HEPARIN LEVEL (UNFRACTIONATED): Heparin Unfractionated: >2.2 IU/mL — ABNORMAL HIGH (ref 0.30–0.70)

## 2014-01-21 MED ORDER — SODIUM CHLORIDE 0.9 % IV SOLN
INTRAVENOUS | Status: DC
  Administered 2014-01-22: 20 mL/h via INTRAVENOUS

## 2014-01-21 MED ORDER — CABERGOLINE 0.5 MG PO TABS: 0.2500 mg | ORAL_TABLET | ORAL | Status: DC

## 2014-01-21 MED ORDER — HEPARIN (PORCINE) IN NACL 100-0.45 UNIT/ML-% IJ SOLN
1100.0000 [IU]/h | INTRAMUSCULAR | Status: DC
  Administered 2014-01-21: 1100 [IU]/h via INTRAVENOUS
  Filled 2014-01-21 (×2): qty 250

## 2014-01-21 NOTE — Progress Notes (Signed)
ANTICOAGULATION CONSULT NOTE - Follow Up Consult  Pharmacy Consult for apixaban Indication: pulmonary embolus and DVT  Allergies  Allergen Reactions  . Clinoril [Sulindac] Rash  . Erythromycin Rash  . Penicillins Rash  . Prozac [Fluoxetine Hcl] Rash  . Sulfa Antibiotics Rash    Patient Measurements: Height: 5\' 4"  (162.6 cm) Weight: 194 lb 0.1 oz (88 kg) IBW/kg (Calculated) : 54.7  Vital Signs: Temp: 97.7 F (36.5 C) (02/19 2150) Temp src: Oral (02/19 2150) BP: 127/47 mmHg (02/19 2150) Pulse Rate: 80 (02/19 2314)  Labs:  Recent Labs  01/18/14 1130 01/18/14 1325 01/20/14 0807 01/21/14 0308  HGB  --   --  9.5* 8.3*  HCT  --   --  30.2* 26.3*  PLT  --   --  255 259  HEPARINUNFRC  --  0.74*  --   --   TROPONINI 0.37*  --   --   --     Estimated Creatinine Clearance: 54.2 ml/min (by C-G formula based on Cr of 0.7).  Assessment: 80 y/o female on heparin for bilateral PE and DVT to transition to apixaban for life long anticoagulation. Pharmacy consulted to dose apixaban. No bleeding noted, Hb down to 8.3 - will watch, platelets are stable.  MD checking stool occult blood.  Goal of Therapy:  Full anticoagulation Monitor platelets by anticoagulation protocol: Yes   Plan:  -Continue Apixaban 10 mg PO bid until 2/25 -  then 5 mg PO bid -CBC q72h -Monitor for s/sx of bleeding  Uvaldo Rising, BCPS  Clinical Pharmacist Pager [phone]  01/21/2014 8:56 AM

## 2014-01-21 NOTE — Consult Note (Signed)
Subjective:   HPI  The patient is an 42 her old female who was admitted to the hospital on February 16 which shortness of breath and found to have bilateral pulmonary emboli. She was initially started on heparin but then transitioned to Eliquis. It was reported to me today that the patient had a melanotic stool. The Eliquis was discontinued. We were asked for our input in regards to the melena. She denies abdominal pain. She denied hematemesis.  Review of Systems Denies chest pain at this time  Past Medical History  Diagnosis Date  . Hypertension   . Arthritis   . GERD (gastroesophageal reflux disease)   . Polio   . Hyperthyroidism   . Peripheral neuropathy   . Shingles   . IBS (irritable bowel syndrome)   . Allergic sinusitis   . Asthma   . Diverticulosis   . Benign tumor of pituitary gland 1999  . IBS (irritable bowel syndrome)   . Cancer   . Uterine cancer    Past Surgical History  Procedure Laterality Date  . Replacement total knee    . Total shoulder replacement    . Tonsillectomy    . Carpal tunnel release Left   . Cataract extraction Right   . Cervical polypectomy     History   Social History  . Marital Status: Widowed    Spouse Name: N/A    Number of Children: N/A  . Years of Education: N/A   Occupational History  . Not on file.   Social History Main Topics  . Smoking status: Former Research scientist (life sciences)  . Smokeless tobacco: Not on file  . Alcohol Use: No  . Drug Use: Not on file  . Sexual Activity: Not on file   Other Topics Concern  . Not on file   Social History Narrative  . No narrative on file   family history is not on file. Current facility-administered medications:albuterol (PROVENTIL) (2.5 MG/3ML) 0.083% nebulizer solution 2.5 mg, 2.5 mg, Inhalation, Q6H PRN, Etta Quill, DO;  [START ON 01/30/2014] cabergoline (DOSTINEX) tablet 0.25 mg, 0.25 mg, Oral, Q14 Days, Kinnie Feil, MD calcium carbonate (OS-CAL - dosed in mg of elemental calcium) tablet  500 mg of elemental calcium, 1 tablet, Oral, BID WC, Jared M Gardner, DO, 500 mg of elemental calcium at 01/21/14 0813;  Chlorhexidine Gluconate Cloth 2 % PADS 6 each, 6 each, Topical, Q0600, Colbert Coyer, MD, 6 each at 01/21/14 [id number];  docusate sodium (COLACE) capsule 100 mg, 100 mg, Oral, BID, Kinnie Feil, MD, 100 mg at 01/21/14 1206 gabapentin (NEURONTIN) capsule 400 mg, 400 mg, Oral, Daily, Jared M Gardner, DO, 400 mg at 01/21/14 1206;  gabapentin (NEURONTIN) capsule 800 mg, 800 mg, Oral, QHS, Jared M Gardner, DO, 800 mg at 01/20/14 2140;  guaiFENesin (MUCINEX) 12 hr tablet 600 mg, 600 mg, Oral, BID PRN, Etta Quill, DO, 600 mg at 01/19/14 0201;  heparin ADULT infusion 100 units/mL (25000 units/250 mL), 1,100 Units/hr, Intravenous, Continuous, Jessica C Carney, RPH levofloxacin (LEVAQUIN) tablet 500 mg, 500 mg, Oral, Daily, Kinnie Feil, MD, 500 mg at 01/21/14 1207;  levothyroxine (SYNTHROID, LEVOTHROID) tablet 100 mcg, 100 mcg, Oral, QAC breakfast, Etta Quill, DO, 100 mcg at 01/21/14 0813;  mirtazapine (REMERON) tablet 60 mg, 60 mg, Oral, QHS, Jared M Gardner, DO, 60 mg at 01/20/14 2138 mometasone-formoterol (DULERA) 100-5 MCG/ACT inhaler 2 puff, 2 puff, Inhalation, BID, Kinnie Feil, MD, 2 puff at 01/21/14 0739;  mupirocin ointment (BACTROBAN) 2 %  1 application, 1 application, Nasal, BID, Colbert Coyer, MD, 1 application at 38/75/64 1207;  pantoprazole (PROTONIX) EC tablet 40 mg, 40 mg, Oral, Daily, Jared M Gardner, DO, 40 mg at 01/21/14 1207 polyethylene glycol (MIRALAX / GLYCOLAX) packet 17 g, 17 g, Oral, Daily, Kinnie Feil, MD, 17 g at 01/20/14 1830;  pramipexole (MIRAPEX) tablet 0.75 mg, 0.75 mg, Oral, QHS, Jared M Gardner, DO, 0.75 mg at 01/20/14 2138;  senna (SENOKOT) tablet 8.6 mg, 1 tablet, Oral, Daily PRN, Kinnie Feil, MD;  traZODone (DESYREL) tablet 50 mg, 50 mg, Oral, QHS PRN, Etta Quill, DO, 50 mg at 01/19/14 0040 Allergies  Allergen  Reactions  . Clinoril [Sulindac] Rash  . Erythromycin Rash  . Penicillins Rash  . Prozac [Fluoxetine Hcl] Rash  . Sulfa Antibiotics Rash     Objective:     BP 153/55  Pulse 115  Temp(Src) 97.8 F (36.6 C) (Oral)  Resp 21  Ht 5\' 4"  (1.626 m)  Wt 88 kg (194 lb 0.1 oz)  BMI 33.28 kg/m2  SpO2 99%  She is in no distress  Heart regular rhythm  Lungs clear  Abdomen: Bowel sounds normal, soft, nontender  Laboratory No components found with this basename: d1      Assessment:      80 year old female with melena in the setting of bilateral pulmonary emboli and on anticoagulation.      Plan:     Case discussed with primary care team. We will schedule her for diagnostic EGD tomorrow to evaluate the upper GI tract. IV heparin has been resumed. She needs to be on this because of her significant bilateral pulmonary emboli. I think that we can safely do a diagnostic EGD with her remaining on this. Although no therapeutic intervention could be done if she is anticoagulated. Lab Results  Component Value Date   HGB 8.6* 01/21/2014   HGB 8.3* 01/21/2014   HGB 9.5* 01/20/2014   HCT 26.6* 01/21/2014   HCT 26.3* 01/21/2014   HCT 30.2* 01/20/2014   ALKPHOS 104 01/17/2014   AST 26 01/17/2014   ALT 17 01/17/2014

## 2014-01-21 NOTE — Progress Notes (Signed)
ANTICOAGULATION CONSULT NOTE - Follow Up Consult  Pharmacy Consult for stop apixiban, start IV heparin Indication: pulmonary embolus and DVT  Allergies  Allergen Reactions  . Clinoril [Sulindac] Rash  . Erythromycin Rash  . Penicillins Rash  . Prozac [Fluoxetine Hcl] Rash  . Sulfa Antibiotics Rash    Patient Measurements: Height: 5\' 4"  (162.6 cm) Weight: 194 lb 0.1 oz (88 kg) IBW/kg (Calculated) : 54.7  Vital Signs: Temp: 97.8 F (36.6 C) (02/20 1324) Temp src: Oral (02/20 1324) BP: 153/55 mmHg (02/20 1324) Pulse Rate: 115 (02/20 1324)  Labs:  Recent Labs  01/20/14 0807 01/21/14 0308  HGB 9.5* 8.3*  HCT 30.2* 26.3*  PLT 255 259    Estimated Creatinine Clearance: 54.2 ml/min (by C-G formula based on Cr of 0.7).  Assessment: 80 y/o female on heparin for bilateral PE with right heart strain and DVT to transition to apixaban for life long anticoagulation. Pharmacy consulted to dose apixaban. No bleeding noted, Hb down to 8.3 - will watch, platelets are stable.  MD checking stool occult blood.  2/20 - Dr. Daleen Bo called, patient now with acute Hgb drop, and + occult blood.  Planning to transfuse 1 unit, and pharmacy asked to stop apixaban and resume IV heparin so that anticoagulation could be more rapidly reversed if needed.  MD hesitant to stop anticoagulation unless absolutely necessary given large PE.  Last apixaban dose given this morning (2/20) at 6:30 AM.  Goal of Therapy:  Anti Xa level 0.3-0.7 (will shoot for lower end of range), but will need to dose with PTT's for now since apixaban can elevate heparin levels, goal 66-102 Monitor platelets by anticoagulation protocol: Yes   Plan:  1. D/C apixaban now. 2. Will check baseline heparin level and PTT now. 3. Start IV heparin at 1100 units/hr 12 hrs after apixaban dose (start at 1800 PM). 4. Check heparin level and PTT 6 hrs after gtt starts. 5. Daily heparin level and CBC. 6. F/u plans for GI bleed.  Uvaldo Rising, BCPS  Clinical Pharmacist Pager [phone]  01/21/2014 4:26 PM

## 2014-01-21 NOTE — Progress Notes (Signed)
CSW provided bed offers to patient. Patient would like to go to [facility]. CSW will work on [employer].  Jeanette Caprice, MSW, [place]

## 2014-01-21 NOTE — Consult Note (Signed)
[facility] [place]  Reason for Consult:  Bilateral PE/DVT-IVC placement Referring Physician:  Daleen Bo MRN #:  [id number]  History of Present Illness: This is a 80 y.o. female who was admitted earlier this week with shortness of breath with activity.  She states that a nurse came to her house a couple of weeks ago and sent her to the hospital for leg swelling.  Pt states she had a doppler at that time and it was negative for DVT and was sent back home.  She also has had a sinus infection and was on ABx and just didn't feel well.  She states that her shortness of breath worsened and she had to call 911.  She was brought to the hospital and found to have bilateral pulmonary emboli and bilateral DVT in the posterior tibial veins.    Pt did have a positive hemooccult sample.  She had been placed on Eliquis with her first dose 2/17 and her last dose being this am.  They are going to discontinue this at this time and restart her heparin.  She states that she has a remote hx of tobacco use, but quit ~ 40 years ago.  She ambulates at home and with a cane when she goes out.  [facility] is consulted for placement of IVC filter.    Past Medical History  Diagnosis Date  . Hypertension   . Arthritis   . GERD (gastroesophageal reflux disease)   . Polio   . Hyperthyroidism   . Peripheral neuropathy   . Shingles   . IBS (irritable bowel syndrome)   . Allergic sinusitis   . Asthma   . Diverticulosis   . Benign tumor of pituitary gland 1999  . IBS (irritable bowel syndrome)   . Cancer   . Uterine cancer    Past Surgical History  Procedure Laterality Date  . Replacement total knee    . Total shoulder replacement    . Tonsillectomy    . Carpal tunnel release Left   . Cataract extraction Right   . Cervical polypectomy      Allergies  Allergen Reactions  . Clinoril [Sulindac] Rash  . Erythromycin Rash  . Penicillins Rash  . Prozac [Fluoxetine Hcl] Rash  . Sulfa  Antibiotics Rash    Prior to Admission medications   Medication Sig Start Date End Date Taking? Authorizing Provider  albuterol (PROVENTIL HFA;VENTOLIN HFA) 108 (90 BASE) MCG/ACT inhaler Inhale 2 puffs into the lungs every 6 (six) hours as needed for wheezing or shortness of breath.   Yes Historical Provider, MD  amLODipine (NORVASC) 5 MG tablet Take 2.5 mg by mouth daily.   Yes Historical Provider, MD  beclomethasone (QVAR) 80 MCG/ACT inhaler Inhale 1 puff into the lungs 2 (two) times daily.   Yes Historical Provider, MD  benazepril (LOTENSIN) 10 MG tablet Take 10 mg by mouth daily.   Yes Historical Provider, MD  cabergoline (DOSTINEX) 0.5 MG tablet Take 0.25 mg by mouth See admin instructions. Takes 1 every other week   Yes Historical Provider, MD  calcium carbonate (OS-CAL - DOSED IN MG OF ELEMENTAL CALCIUM) 1250 MG tablet Take 1 tablet by mouth 2 (two) times daily.   Yes Historical Provider, MD  Cholecalciferol (VITAMIN D PO) Take 1 tablet by mouth daily.   Yes Historical Provider, MD  doxycycline (DORYX) 100 MG EC tablet Take 100 mg by mouth 2 (two) times daily. Dispensed 12/31/13. 10 day supply   Yes Historical Provider, MD  gabapentin (NEURONTIN) 400 MG capsule Take 400-800 mg by mouth 2 (two) times daily. Take 400mg  in the morning and 800mg  in the evening   Yes Historical Provider, MD  guaiFENesin (MUCINEX) 600 MG 12 hr tablet Take 600 mg by mouth 2 (two) times daily as needed for cough or to loosen phlegm.   Yes Historical Provider, MD  levothyroxine (SYNTHROID, LEVOTHROID) 100 MCG tablet Take 100 mcg by mouth daily before breakfast.   Yes Historical Provider, MD  mirtazapine (REMERON) 30 MG tablet Take 60 mg by mouth at bedtime.   Yes Historical Provider, MD  naproxen sodium (ANAPROX) 220 MG tablet Take 220 mg by mouth 2 (two) times daily with a meal.   Yes Historical Provider, MD  Omega-3 Fatty Acids (FISH OIL) 1200 MG CAPS Take 1,200 mg by mouth daily.   Yes Historical Provider, MD   omeprazole (PRILOSEC OTC) 20 MG tablet Take 20 mg by mouth daily.   Yes Historical Provider, MD  pramipexole (MIRAPEX) 0.75 MG tablet Take 0.75 mg by mouth at bedtime.   Yes Historical Provider, MD  Pseudoephedrine-APAP-DM (DAYQUIL PO) Take 15 mLs by mouth daily as needed (cold symptoms).   Yes Historical Provider, MD  ranitidine (ZANTAC) 300 MG tablet Take 300 mg by mouth daily as needed for heartburn.   Yes Historical Provider, MD  traZODone (DESYREL) 50 MG tablet Take 50 mg by mouth at bedtime as needed for sleep.   Yes Historical Provider, MD  vitamin B-12 (CYANOCOBALAMIN) 1000 MCG tablet Take 1,000 mcg by mouth daily.   Yes Historical Provider, MD  vitamin E 100 UNIT capsule Take 100 Units by mouth daily.   Yes Historical Provider, MD    History   Social History  . Marital Status: Widowed    Spouse Name: N/A    Number of Children: N/A  . Years of Education: N/A   Occupational History  . Not on file.   Social History Main Topics  . Smoking status: Former Research scientist (life sciences)  . Smokeless tobacco: Not on file  . Alcohol Use: No  . Drug Use: Not on file  . Sexual Activity: Not on file   Other Topics Concern  . Not on file   Social History Narrative  . No narrative on file     History reviewed. No pertinent family history.  ROS: [x]  Positive   [ ]  Negative   [ ]  All sytems reviewed and are negative  Cardiovascular: []  chest tightness []  palpitations []  SOB [x]  PE-bilateral [x]  DOE []  pain in legs while walking []  pain in legs at rest []  pain in legs at night []  non-healing ulcers [x]  hx of DVT [x]  swelling in legs  Pulmonary: []  productive cough []  asthma/wheezing []  home O2  Neurologic: []  weakness in []  arms []  legs []  numbness in []  arms []  legs []  hx of CVA []  mini stroke [] difficulty speaking or slurred speech []  temporary loss of vision in one eye []  dizziness  Hematologic: [x]  hx of cancer-uterine []  bleeding problems [x]  problems with blood clotting  easily  Endocrine:   []  diabetes [x]  thyroid disease  GI []  vomiting blood [x]  blood in stool [x]  GERD/hiatal hernia  GU: []  CKD/renal failure []  HD--[]  M/W/F or []  T/T/S []  burning with urination []  blood in urine  Psychiatric: []  anxiety []  depression  Musculoskeletal: [x]  arthritis []  joint pain [x]  hx total knee replacement  Integumentary: []  rashes []  ulcers  Constitutional: []  fever []  chills   Physical Examination  Filed Vitals:  01/21/14 1324  BP: 153/55  Pulse: 115  Temp: 97.8 F (36.6 C)  Resp: 21   Body mass index is 33.28 kg/(m^2).  General:  WDWN in NAD Gait: Not observed HENT: WNL, normocephalic Eyes: Pupils equal Pulmonary: normal non-labored breathing, without Rales, rhonchi,  wheezing Cardiac: regular, without  Murmurs, rubs or gallops; without carotid bruits Abdomen: soft, NT/ND, no masses Skin: without rashes, without ulcers  Vascular Exam/Pulses:+ palpable femoral pulses bilaterally; right DP is palpable; pedal pulses are not palpable in the left.  1+ radial pulse on the right; 2 + radial and ulnar pulse on the left. Extremities: without ischemic changes, without Gangrene , with cellulitis; without open wounds; +BLE edema Musculoskeletal: no muscle wasting or atrophy  Neurologic: A&O X 3; Appropriate Affect ; SENSATION: normal; MOTOR FUNCTION:  RUE weakness from polio age 71. Speech is fluent/normal   CBC    Component Value Date/Time   WBC 5.2 01/21/2014 0308   RBC 3.16* 01/21/2014 0308   HGB 8.3* 01/21/2014 0308   HCT 26.3* 01/21/2014 0308   PLT 259 01/21/2014 0308   MCV 83.2 01/21/2014 0308   MCH 26.3 01/21/2014 0308   MCHC 31.6 01/21/2014 0308   RDW 15.5 01/21/2014 0308    BMET    Component Value Date/Time   NA 139 01/18/2014 0535   K 4.0 01/18/2014 0535   CL 104 01/18/2014 0535   CO2 23 01/18/2014 0535   GLUCOSE 111* 01/18/2014 0535   BUN 20 01/18/2014 0535   CREATININE 0.70 01/18/2014 0535   CALCIUM 8.1* 01/18/2014 0535    GFRNONAA 76* 01/18/2014 0535   GFRAA 88* 01/18/2014 0535    COAGS: Lab Results  Component Value Date   INR 1.16 01/17/2014     Non-Invasive Vascular Imaging:   Lower extremity duplex 01/18/14:  Summary:  - Findings consistent with acute deep vein thrombosis involving the right posterior tibial vein. - Findings consistent with acute deep vein thrombosis involving the left posterior tibial vein. Bilateral: Can not rule out DVT in the peroneal vein due to the patient's guarding secondary to the pain. - No evidence of Baker's cyst on the right or left.  CT Scan 01/17/14: IMPRESSION:  1. Bilateral pulmonary thromboemboli emboli involving all of the  lung lobes.  2. CT evidence of right heartstrain (RV/LV Ratio = 1.8) consistent  with at least submassive (intermediate risk) PE. The presence of  right heart strain has been associated with anincreased risk of  morbidity and mortality. Consultation with [place] is recommended.  3. Small focus of pneumonia versus in pulmonary infarction in the  left upper lobe.   Statin:  no Beta Blocker:  no Aspirin:  no ACEI:  yes ARB:  no Other antiplatelets/anticoagulants:  no   ASSESSMENT/PLAN: This is a 80 y.o. female with bilateral pulmonary emboli and bilateral DVT.  She has been on Eliquis since 2/17 with her last dose today.  This is discontinued today due to GIB.  She will be restarted on heparin gtt.  Will schedule for IVC filter Monday morning due to half life of Eliquis as she needs to be off of this for 36 hours.  Leontine Locket, PA-C [facility] [phone]  History and exam details as above.  Recent PE started on Eliquis but then developed slow GI bleed.  Not hemodynamically unstable.  She needs at least 3 half lives to wash out her Eliquis which is non reversible.  This would put her at optimum Sunday  sometime.  If she remains stable would continue heparin for now if unstable or  worsening bleed could place filter on Sunday otherwise will have Dr Early place filter Monday morning.  Continue heparin for now.  Ruta Hinds, MD [facility] [place] Office: [phone] Pager: [phone]

## 2014-01-21 NOTE — Progress Notes (Addendum)
[facility] PROGRESS NOTE  Crystal [name] [id number] DOB: 1925-11-09 DOA: 01/17/2014 PCP: Mathews Argyle, MD  Assessment/Plan: 80 y.o. female admitted on 01/17/2014 with a past medical history of Hypertension; Arthritis; GERD; Polio; Hyperthyroidism; Peripheral neuropathy; Shingles; IBS; Allergic sinusitis; Asthma; Diverticulosis; Benign tumor of pituitary gland (1999); IBS; and Uterine cancer who presented with a complaint of dyspnea on exertion and extreme fatigue for 1 wk now. She also c/o chest tightness on admission. She was found to have extensive PEs per CT scan.   1. Bilateral pulmonary emboli; patient is evaluated by Pulm recommended  lifelong AC tx  -she was on heparin --> transitioned to Eliquis 2/17  -echo: LVEF 65%, reduced RV function; pulmonary HTN; no s/s of fluids overload; monitor off diuretics  -2/20: patient had episode of melena, +occulty blood; d/c apixaban, start IV heparin due to significant PE; if bleeds more will reconsider anticoagulation, pend GI eval;   2. B LE Posterior Tibial V DVT anticoag as noted above  -consult vascular surgery for IVC filter evaluation due to GIB;  3. Hypotension with h/o HTN hold antihypertensives  4. COPD No longer smoking , reports cough, cont bronchodilators, add advair, levofloxacin few days   5. GERD/ Diverticulosis  f/u stool hemoccults  6. Hypothyroid cont Synthroid  7. History of polio with right upper extremity weakness -PT recommended SNF  8. Acute blood loss anemia;  -2/20: Hg dropped; +occult blood; patient is on apixaban;  -d/c apixaban, type screen; TF 1 unit; TF prn; Hg Q8 hours; consult GI need endoscopy   Prognosis guarded   Code Status: full Family Communication: d/w patient, updated [name] [phone]  (indicate person spoken with, relationship, and if by phone, the number) Disposition Plan: SNF when bed avail;abel    Consultants:  Pulmonary   Procedures:  Non  e  Antibiotics:  Levofloxacin 2/19 (indicate start date, and stop date if known)  HPI/Subjective: alert  Objective: Filed Vitals:   01/20/14 2314  BP:   Pulse: 80  Temp:   Resp: 16    Intake/Output Summary (Last 24 hours) at 01/21/14 0959 Last data filed at 01/20/14 1200  Gross per 24 hour  Intake    240 ml  Output      0 ml  Net    240 ml   Filed Weights   01/18/14 0155  Weight: 88 kg (194 lb 0.1 oz)    Exam:   General:  alert  Cardiovascular: s1,s2 rrr  Respiratory: CTA BL  Abdomen: soft, nt, nd   Musculoskeletal: no LE edema   Data Reviewed: Basic Metabolic Panel:  Recent Labs Lab 01/17/14 2006 01/18/14 0535 01/19/14 0236  NA 135* 139  --   K 4.3 4.0  --   CL 97 104  --   CO2 23 23  --   GLUCOSE 126* 111*  --   BUN 25* 20  --   CREATININE 0.89 0.70  --   CALCIUM 9.2 8.1*  --   MG  --   --  1.7   Liver Function Tests:  Recent Labs Lab 01/17/14 2006  AST 26  ALT 17  ALKPHOS 104  BILITOT 0.4  PROT 6.8  ALBUMIN 3.3*   No results found for this basename: LIPASE, AMYLASE,  in the last 168 hours No results found for this basename: AMMONIA,  in the last 168 hours CBC:  Recent Labs Lab 01/17/14 2002 01/18/14 0535 01/20/14 0807 01/21/14 0308  WBC 12.0* 8.7 6.6 5.2  HGB 12.5  10.2* 9.5* 8.3*  HCT 37.4 31.9* 30.2* 26.3*  MCV 81.5 81.8 82.7 83.2  PLT 380 308 255 259   Cardiac Enzymes:  Recent Labs Lab 01/17/14 2002 01/17/14 2045 01/17/14 2345 01/18/14 0545 01/18/14 1130  TROPONINI 0.46* 0.33* 0.45* 0.57* 0.37*   BNP (last 3 results)  Recent Labs  01/17/14 2002  PROBNP 604.6*   CBG: No results found for this basename: GLUCAP,  in the last 168 hours  Recent Results (from the past 240 hour(s))  MRSA PCR SCREENING     Status: Abnormal   Collection Time    01/18/14  1:50 AM      Result Value Ref Range Status   MRSA by PCR POSITIVE (*) NEGATIVE Final   Comment:            The GeneXpert MRSA Assay (FDA     approved  for NASAL specimens     only), is one component of a     comprehensive MRSA colonization     surveillance program. It is not     intended to diagnose MRSA     infection nor to guide or     monitor treatment for     MRSA infections.     RESULT CALLED TO, READ BACK BY AND VERIFIED WITHElmarie Mainland RN [id number] 01/18/14 MITCHELL,L     Studies: No results found.  Scheduled Meds: . apixaban  10 mg Oral Q12H   Followed by  . [START ON 01/26/2014] apixaban  5 mg Oral Q12H  . cabergoline  0.25 mg Oral Q14 Days  . calcium carbonate  1 tablet Oral BID WC  . Chlorhexidine Gluconate Cloth  6 each Topical Q0600  . docusate sodium  100 mg Oral BID  . gabapentin  400 mg Oral Daily  . gabapentin  800 mg Oral QHS  . levofloxacin  500 mg Oral Daily  . levothyroxine  100 mcg Oral QAC breakfast  . mirtazapine  60 mg Oral QHS  . mometasone-formoterol  2 puff Inhalation BID  . mupirocin ointment  1 application Nasal BID  . pantoprazole  40 mg Oral Daily  . polyethylene glycol  17 g Oral Daily  . pramipexole  0.75 mg Oral QHS   Continuous Infusions:   Principal Problem:   Bilateral pulmonary embolism Active Problems:   PE (pulmonary embolism)   DVT (deep venous thrombosis)   Hypotension, unspecified    Time spent: >35 minute s    Kinnie Feil  [facility] Pager [phone]. If 7PM-7AM, please contact night-coverage at [email or web address], password [email or web address] 01/21/2014, 9:59 AM  LOS: 4 days

## 2014-01-22 ENCOUNTER — Encounter (HOSPITAL_COMMUNITY): Payer: Self-pay | Admitting: *Deleted

## 2014-01-22 ENCOUNTER — Encounter (HOSPITAL_COMMUNITY)
Admission: EM | Disposition: A | Discharge status: Skilled Nursing Facility | Payer: Self-pay | Source: Home / Self Care | Attending: Internal Medicine

## 2014-01-22 HISTORY — PX: ESOPHAGOGASTRODUODENOSCOPY: SHX5428

## 2014-01-22 LAB — CBC
HEMATOCRIT: 27.8 % — AB (ref 36.0–46.0)
Hemoglobin: 8.9 g/dL — ABNORMAL LOW (ref 12.0–15.0)
MCH: 26.6 pg (ref 26.0–34.0)
MCHC: 32 g/dL (ref 30.0–36.0)
MCV: 83 fL (ref 78.0–100.0)
Platelets: 230 10*3/uL (ref 150–400)
RBC: 3.35 MIL/uL — AB (ref 3.87–5.11)
RDW: 16 % — ABNORMAL HIGH (ref 11.5–15.5)
WBC: 4.4 10*3/uL (ref 4.0–10.5)

## 2014-01-22 LAB — HEMOGLOBIN AND HEMATOCRIT, BLOOD
HCT: 27.4 % — ABNORMAL LOW (ref 36.0–46.0)
HCT: 27.8 % — ABNORMAL LOW (ref 36.0–46.0)
HEMOGLOBIN: 8.9 g/dL — AB (ref 12.0–15.0)
Hemoglobin: 9 g/dL — ABNORMAL LOW (ref 12.0–15.0)

## 2014-01-22 LAB — APTT
APTT: 153 s — AB (ref 24–37)
aPTT: 86 seconds — ABNORMAL HIGH (ref 24–37)

## 2014-01-22 LAB — TYPE AND SCREEN
ABO/RH(D): O NEG
Antibody Screen: NEGATIVE
UNIT DIVISION: 0

## 2014-01-22 LAB — HEPARIN LEVEL (UNFRACTIONATED): Heparin Unfractionated: >2.2 IU/mL — ABNORMAL HIGH (ref 0.30–0.70)

## 2014-01-22 SURGERY — EGD (ESOPHAGOGASTRODUODENOSCOPY)
Anesthesia: Moderate Sedation

## 2014-01-22 MED ORDER — HEPARIN (PORCINE) IN NACL 100-0.45 UNIT/ML-% IJ SOLN
850.0000 [IU]/h | INTRAMUSCULAR | Status: DC
  Administered 2014-01-22: 950 [IU]/h via INTRAVENOUS
  Administered 2014-01-23 (×2): 850 [IU]/h via INTRAVENOUS
  Filled 2014-01-22 (×2): qty 250

## 2014-01-22 MED ORDER — MIDAZOLAM HCL 10 MG/2ML IJ SOLN
INTRAMUSCULAR | Status: DC | PRN
  Administered 2014-01-22: 1 mg via INTRAVENOUS
  Administered 2014-01-22: 1.5 mg via INTRAVENOUS

## 2014-01-22 MED ORDER — FENTANYL CITRATE 0.05 MG/ML IJ SOLN
INTRAMUSCULAR | Status: DC | PRN
  Administered 2014-01-22: 25 ug via INTRAVENOUS

## 2014-01-22 MED ORDER — FENTANYL CITRATE 0.05 MG/ML IJ SOLN
INTRAMUSCULAR | Status: AC
  Filled 2014-01-22: qty 2

## 2014-01-22 MED ORDER — MIDAZOLAM HCL 5 MG/ML IJ SOLN
INTRAMUSCULAR | Status: AC
  Filled 2014-01-22: qty 1

## 2014-01-22 MED ORDER — BUTAMBEN-TETRACAINE-BENZOCAINE 2-2-14 % EX AERO
INHALATION_SPRAY | CUTANEOUS | Status: DC | PRN
  Administered 2014-01-22: 2 via TOPICAL

## 2014-01-22 NOTE — Progress Notes (Signed)
ANTICOAGULATION CONSULT NOTE - Follow Up Consult  Pharmacy Consult for heparin Indication: pulmonary embolus and DVT  Allergies  Allergen Reactions  . Clinoril [Sulindac] Rash  . Erythromycin Rash  . Penicillins Rash  . Prozac [Fluoxetine Hcl] Rash  . Sulfa Antibiotics Rash    Patient Measurements: Height: 5\' 4"  (162.6 cm) Weight: 194 lb 0.1 oz (88 kg) IBW/kg (Calculated) : 54.7  Vital Signs: Temp: 98.1 F (36.7 C) (02/21 1500) Temp src: Oral (02/21 1300) BP: 147/69 mmHg (02/21 1500) Pulse Rate: 62 (02/21 1445)  Labs:  Recent Labs  01/20/14 0807 01/21/14 0308  01/21/14 1650  01/22/14 0345 01/22/14 0749 01/22/14 1510  HGB 9.5* 8.3*  < >  --   < > 8.9* 9.0* 8.9*  HCT 30.2* 26.3*  < >  --   < > 27.8* 27.4* 27.8*  PLT 255 259  --   --   --  230  --   --   APTT  --   --   --  42*  --  153*  --  86*  HEPARINUNFRC  --   --   --  >2.20*  --  >2.20*  --   --   < > = values in this interval not displayed.  Estimated Creatinine Clearance: 54.2 ml/min (by C-G formula based on Cr of 0.7).  Assessment: 80 y/o female with PE/DVT for heparin.  Heparin level remains falsely elevated this AM due to recent Eliquis.  Using PTT to guide heparin therapy for now.  Current PTT at goal at 86.  S/p EGD, continuing heparin.  Hgb low but stable.  Goal of Therapy:  Anti Xa level 0.3-0.7  aPTT 66-102 Monitor platelets by anticoagulation protocol: Yes   Plan:  1. Continue IV heparin at current rate. 2. Will recheck PTT in 6 hrs. 3. Continue daily heparin level, CBC and PTT for now. 4. F/u plans to resume oral anticoagulation.  Uvaldo Rising, BCPS  Clinical Pharmacist Pager [phone]  01/22/2014 4:11 PM

## 2014-01-22 NOTE — Progress Notes (Addendum)
[facility] PROGRESS NOTE  Crystal [name] IEP:[id number] DOB: 04/20/1925 DOA: 01/17/2014 PCP: Mathews Argyle, MD  Assessment/Plan: 80 y.o. female admitted on 01/17/2014 with a past medical history of Hypertension; Arthritis; GERD; Polio; Hyperthyroidism; Peripheral neuropathy; Shingles; IBS; Allergic sinusitis; Asthma; Diverticulosis; Benign tumor of pituitary gland (1999); IBS; and Uterine cancer who presented with a complaint of dyspnea on exertion and extreme fatigue for 1 wk now. She also c/o chest tightness on admission. She was found to have extensive PEs per CT scan -started on [facility] developed GIB  1. Bilateral pulmonary emboli; patient is evaluated by Pulm recommended  lifelong AC tx  -echo: LVEF 65%, reduced RV function; pulmonary HTN; no s/s of fluids overload; monitor off diuretics  -she was on IV heparin --> transitioned to Eliquis 2/17 - -2/20: patient had episode of melena, +occult blood; -->d/c apixaban, start IV heparin due to significant PE; if bleeds more will reconsider anticoagulation, pend EGD   2. B LE Posterior Tibial V DVT anticoag as noted above  appreciate vascular surgery evaluation for IVC filter   3. Acute blood loss anemia on anticoagulation with GIB; d/c apixaban 2/20;  -Tf sed 1 unit 2/20; monitor Hg, TF prn; appreciate GI evaluation for EGD/pend    3. Hypotension with h/o HTN hold antihypertensives  4. COPD No longer smoking , reports cough, cont bronchodilators, added advair, levofloxacin few days   5. GERD/ Diverticulosis  f/u stool hemoccults  6. Hypothyroid cont Synthroid  7. History of polio with right upper extremity weakness -PT recommended SNF   D/w patient , her son extensively about her medical condition, management, prognosis, VTE, anticoagulation, bleeding, complications;  -  Prognosis guarded   Code Status: full Family Communication: d/w patient, updated [name] [phone]  (indicate person spoken with, relationship,  and if by phone, the number) Disposition Plan: SNF when clinically stable    Consultants:  Pulmonary   Procedures:  Non e  Antibiotics:  Levofloxacin 2/19 (indicate start date, and stop date if known)  HPI/Subjective: alert  Objective: Filed Vitals:   01/22/14 0643  BP: 138/68  Pulse: 74  Temp: 98.4 F (36.9 C)  Resp: 18    Intake/Output Summary (Last 24 hours) at 01/22/14 0804 Last data filed at 01/22/14 0700  Gross per 24 hour  Intake 1333.5 ml  Output      0 ml  Net 1333.5 ml   Filed Weights   01/18/14 0155  Weight: 88 kg (194 lb 0.1 oz)    Exam:   General:  alert  Cardiovascular: s1,s2 rrr  Respiratory: CTA BL  Abdomen: soft, nt, nd   Musculoskeletal: no LE edema   Data Reviewed: Basic Metabolic Panel:  Recent Labs Lab 01/17/14 2006 01/18/14 0535 01/19/14 0236  NA 135* 139  --   K 4.3 4.0  --   CL 97 104  --   CO2 23 23  --   GLUCOSE 126* 111*  --   BUN 25* 20  --   CREATININE 0.89 0.70  --   CALCIUM 9.2 8.1*  --   MG  --   --  1.7   Liver Function Tests:  Recent Labs Lab 01/17/14 2006  AST 26  ALT 17  ALKPHOS 104  BILITOT 0.4  PROT 6.8  ALBUMIN 3.3*   No results found for this basename: LIPASE, AMYLASE,  in the last 168 hours No results found for this basename: AMMONIA,  in the last 168 hours CBC:  Recent Labs Lab 01/17/14 2002  01/18/14 0535 01/20/14 0807 01/21/14 0308 01/21/14 1555 01/21/14 2310 01/22/14 0345  WBC 12.0* 8.7 6.6 5.2  --   --  4.4  HGB 12.5 10.2* 9.5* 8.3* 8.6* 8.7* 8.9*  HCT 37.4 31.9* 30.2* 26.3* 26.6* 27.1* 27.8*  MCV 81.5 81.8 82.7 83.2  --   --  83.0  PLT 380 308 255 259  --   --  230   Cardiac Enzymes:  Recent Labs Lab 01/17/14 2002 01/17/14 2045 01/17/14 2345 01/18/14 0545 01/18/14 1130  TROPONINI 0.46* 0.33* 0.45* 0.57* 0.37*   BNP (last 3 results)  Recent Labs  01/17/14 2002  PROBNP 604.6*   CBG: No results found for this basename: GLUCAP,  in the last 168  hours  Recent Results (from the past 240 hour(s))  MRSA PCR SCREENING     Status: Abnormal   Collection Time    01/18/14  1:50 AM      Result Value Ref Range Status   MRSA by PCR POSITIVE (*) NEGATIVE Final   Comment:            The GeneXpert MRSA Assay (FDA     approved for NASAL specimens     only), is one component of a     comprehensive MRSA colonization     surveillance program. It is not     intended to diagnose MRSA     infection nor to guide or     monitor treatment for     MRSA infections.     RESULT CALLED TO, READ BACK BY AND VERIFIED WITHElmarie Mainland RN [id number] 01/18/14 MITCHELL,L     Studies: No results found.  Scheduled Meds: . [START ON 01/30/2014] cabergoline  0.25 mg Oral Q14 Days  . calcium carbonate  1 tablet Oral BID WC  . Chlorhexidine Gluconate Cloth  6 each Topical Q0600  . docusate sodium  100 mg Oral BID  . gabapentin  400 mg Oral Daily  . gabapentin  800 mg Oral QHS  . levofloxacin  500 mg Oral Daily  . levothyroxine  100 mcg Oral QAC breakfast  . mirtazapine  60 mg Oral QHS  . mometasone-formoterol  2 puff Inhalation BID  . mupirocin ointment  1 application Nasal BID  . pantoprazole  40 mg Oral Daily  . polyethylene glycol  17 g Oral Daily  . pramipexole  0.75 mg Oral QHS   Continuous Infusions: . sodium chloride    . heparin 950 Units/hr (01/22/14 2130)    Principal Problem:   Bilateral pulmonary embolism Active Problems:   PE (pulmonary embolism)   DVT (deep venous thrombosis)   Hypotension, unspecified    Time spent: >35 minute s    Kinnie Feil  [facility] Pager [phone]. If 7PM-7AM, please contact night-coverage at [email or web address], password [email or web address] 01/22/2014, 8:04 AM  LOS: 5 days

## 2014-01-22 NOTE — Progress Notes (Signed)
Crystal [name] N.P. Text and made aware of Hbg and Hct results after one unit PRBC"S transfused

## 2014-01-22 NOTE — Op Note (Signed)
[facility] [place], [address]   ENDOSCOPY PROCEDURE REPORT  PATIENT: Yomira, Flitton  MR#: [id number] BIRTHDATE: 1925-06-05 , 39  yrs. old GENDER: Female ENDOSCOPIST: Acquanetta Sit, MD REFERRED BY: PROCEDURE DATE:  01/22/2014 PROCEDURE:   diagnostic EGD ASA CLASS: 3 INDICATIONS: melena MEDICATIONS: fentanyl 25 mcg IV, Versed 2.5 mg IV TOPICAL ANESTHETIC: Cetacaine spray  DESCRIPTION OF PROCEDURE:   After the risks benefits and alternatives of the procedure were thoroughly explained, informed consent was obtained.  The Pentax Gastroscope [id number]  endoscope was introduced through the mouth and advanced to the second portion of the duodenum      , limited by Without limitations.   The instrument was slowly withdrawn as the mucosa was fully examined.      FINDINGS:  Esophagus: Normal  Stomach: There were a few small fleshy colored benign insignificant-appearing gastric polyps in the lower body of the stomach. These do not appear to have been bleeding. They are of no clinical significance in this patient.  Duodenum: Normal  COMPLICATIONS:none  ENDOSCOPIC IMPRESSION:see above   RECOMMENDATIONS:continue to monitor for any signs of bleeding, watch for drop in H&H. I would not at this time pursue evaluation of the colon unless bleeding continued.      _______________________________ Carrie Mew, MD 01/22/2014 2:17 PM       PATIENT NAME:  Avonell, Lenig MR#: [id number]

## 2014-01-22 NOTE — Progress Notes (Signed)
ANTICOAGULATION CONSULT NOTE - Follow Up Consult  Pharmacy Consult for heparin Indication: pulmonary embolus and DVT  Allergies  Allergen Reactions  . Clinoril [Sulindac] Rash  . Erythromycin Rash  . Penicillins Rash  . Prozac [Fluoxetine Hcl] Rash  . Sulfa Antibiotics Rash    Patient Measurements: Height: 5\' 4"  (162.6 cm) Weight: 194 lb 0.1 oz (88 kg) IBW/kg (Calculated) : 54.7  Vital Signs: Temp: 98.7 F (37.1 C) (02/20 2137) Temp src: Oral (02/20 2137) BP: 113/73 mmHg (02/20 2137) Pulse Rate: 82 (02/20 2137)  Labs:  Recent Labs  01/20/14 0807 01/21/14 0308 01/21/14 1555 01/21/14 1650 01/21/14 2310 01/22/14 0345  HGB 9.5* 8.3* 8.6*  --  8.7* 8.9*  HCT 30.2* 26.3* 26.6*  --  27.1* 27.8*  PLT 255 259  --   --   --  230  APTT  --   --   --  42*  --  153*  HEPARINUNFRC  --   --   --  >2.20*  --   --     Estimated Creatinine Clearance: 54.2 ml/min (by C-G formula based on Cr of 0.7).  Assessment: 80 y/o female with PE/DVT for heparin Goal of Therapy:  Anti Xa level 0.3-0.7  aPTT 66-102 Monitor platelets by anticoagulation protocol: Yes   Plan:  Hold heparin x 1 hr, then decrease heparin 950 units/hr Check heparin level in 8 hours or after EGD  Phillis Knack, PharmD, BCPS    01/22/2014 5:00 AM

## 2014-01-23 LAB — HEMOGLOBIN AND HEMATOCRIT, BLOOD
HEMATOCRIT: 27.6 % — AB (ref 36.0–46.0)
Hemoglobin: 9 g/dL — ABNORMAL LOW (ref 12.0–15.0)

## 2014-01-23 LAB — APTT
APTT: 94 s — AB (ref 24–37)
aPTT: 106 seconds — ABNORMAL HIGH (ref 24–37)
aPTT: 55 seconds — ABNORMAL HIGH (ref 24–37)

## 2014-01-23 LAB — HEPARIN LEVEL (UNFRACTIONATED): HEPARIN UNFRACTIONATED: 0.83 [IU]/mL — AB (ref 0.30–0.70)

## 2014-01-23 MED ORDER — HEPARIN (PORCINE) IN NACL 100-0.45 UNIT/ML-% IJ SOLN
1300.0000 [IU]/h | INTRAMUSCULAR | Status: DC
  Administered 2014-01-24: 900 [IU]/h via INTRAVENOUS
  Administered 2014-01-25: 950 [IU]/h via INTRAVENOUS
  Administered 2014-01-26: 1100 [IU]/h via INTRAVENOUS
  Administered 2014-01-27: 1200 [IU]/h via INTRAVENOUS
  Administered 2014-01-28: 1300 [IU]/h via INTRAVENOUS
  Filled 2014-01-23 (×9): qty 250

## 2014-01-23 MED ORDER — GUAIFENESIN ER 600 MG PO TB12
600.0000 mg | ORAL_TABLET | Freq: Four times a day (QID) | ORAL | Status: DC
  Administered 2014-01-23 – 2014-01-25 (×7): 600 mg via ORAL
  Filled 2014-01-23 (×13): qty 1

## 2014-01-23 MED ORDER — ACETAMINOPHEN 325 MG PO TABS
650.0000 mg | ORAL_TABLET | Freq: Four times a day (QID) | ORAL | Status: DC | PRN
  Administered 2014-01-23 – 2014-01-29 (×3): 650 mg via ORAL
  Filled 2014-01-23 (×3): qty 2

## 2014-01-23 NOTE — Progress Notes (Signed)
ANTICOAGULATION CONSULT NOTE - Follow Up Consult  Pharmacy Consult for Heparin  Indication: pulmonary embolus and DVT  Allergies  Allergen Reactions  . Clinoril [Sulindac] Rash  . Erythromycin Rash  . Penicillins Rash  . Prozac [Fluoxetine Hcl] Rash  . Sulfa Antibiotics Rash    Patient Measurements: Height: 5\' 4"  (162.6 cm) Weight: 194 lb 0.1 oz (88 kg) IBW/kg (Calculated) : 54.7  Vital Signs: Temp: 98.7 F (37.1 C) (02/22 2033) Temp src: Oral (02/22 2033) BP: 139/78 mmHg (02/22 2033) Pulse Rate: 80 (02/22 2119)  Labs:  Recent Labs  01/21/14 0308  01/21/14 1650  01/22/14 0345 01/22/14 0749 01/22/14 1510 01/23/14 0051 01/23/14 1230 01/23/14 2007  HGB 8.3*  < >  --   < > 8.9* 9.0* 8.9* 9.0*  --   --   HCT 26.3*  < >  --   < > 27.8* 27.4* 27.8* 27.6*  --   --   PLT 259  --   --   --  230  --   --   --   --   --   APTT  --   < > 42*  --  153*  --  86* 106* 55* 94*  HEPARINUNFRC  --   --  >2.20*  --  >2.20*  --   --   --  0.83*  --   < > = values in this interval not displayed.  Estimated Creatinine Clearance: 54.2 ml/min (by C-G formula based on Cr of 0.7).   Medications: . heparin 950 Units/hr (01/23/14 1500)    Assessment: 80 y/o F on heparin for PE/DVT. Was on apixaban, so currently using aPTT to guide dosing. APTT was slightly elevated this AM at 106, and gtt rate was reduced.  Now PTT within goal range.  No further bleeding noted.  Heparin level remains elevated, likely from influence of Eliquis.  Will continue dosing heparin based on PTTs for now.   Goal of Therapy:  Heparin level 0.3-0.7 units/ml aPTT 66-102 seconds Monitor platelets by anticoagulation protocol: Yes   Plan:  -Continue IV heparin at current rate of 950 units/hr. -Recheck PTT with AM labs. -Daily CBC/HL/aPTT until HL and aPTT correlate  -Monitor for bleeding  Uvaldo Rising, BCPS  Clinical Pharmacist Pager [phone]  01/23/2014 9:34 PM

## 2014-01-23 NOTE — Progress Notes (Signed)
[facility] PROGRESS NOTE  Crystal [name]:[id number] DOB: 08-23-1925 DOA: 01/17/2014 PCP: Mathews Argyle, MD  Assessment/Plan: 80 y.o. female admitted on 01/17/2014 with a past medical history of Hypertension; Arthritis; GERD; Polio; Hyperthyroidism; Peripheral neuropathy; Shingles; IBS; Allergic sinusitis; Asthma; Diverticulosis; Benign tumor of pituitary gland (1999); IBS; and Uterine cancer who presented with a complaint of dyspnea on exertion and extreme fatigue for 1 wk now. She also c/o chest tightness on admission. She was found to have extensive PEs per CT scan -started on [facility] developed GIB  1. Bilateral pulmonary emboli; patient is evaluated by Pulm recommended  lifelong AC tx  -echo: LVEF 65%, reduced RV function; pulmonary HTN; no s/s of fluids overload; monitor off diuretics  -she was on IV heparin --> transitioned to Eliquis 2/17 - -2/20: patient had episode of melena, +occult blood; -->d/c apixaban, start IV heparin due to significant PE; if bleeds more will reconsider anticoagulation;  -likely need coumadin rather than apixaban due high risk of re bleeding; for now cont IV heparin   2. B LE Posterior Tibial V DVT anticoag as noted above  -appreciate vascular surgery evaluation for IVC filter   3. Acute blood loss anemia on anticoagulation with GIB; d/c apixaban 2/20;  -Tf sed 1 unit 2/20; monitor Hg, TF prn;  -s/p EGD: gastric polys no obvious source for bleeding; appreciate GI evaluation for EGD/pend    3. Hypotension with h/o HTN hold antihypertensives  4. COPD No longer smoking , reports cough, cont bronchodilators, added advair, levofloxacin few days   5. GERD/ Diverticulosis  f/u stool hemoccults  6. Hypothyroid cont Synthroid  7. History of polio with right upper extremity weakness -PT recommended SNF   D/w patient , her son extensively about her medical condition, management, prognosis, VTE, anticoagulation, bleeding, complications;  -  Prognosis  guarded   Code Status: full Family Communication: d/w patient, updated [name] [phone]  (indicate person spoken with, relationship, and if by phone, the number) Disposition Plan: SNF when clinically stable    Consultants:  Pulmonary   Procedures:  Non e  Antibiotics:  Levofloxacin 2/19 (indicate start date, and stop date if known)  HPI/Subjective: alert  Objective: Filed Vitals:   01/23/14 0519  BP: 115/75  Pulse: 78  Temp: 98.3 F (36.8 C)  Resp: 18    Intake/Output Summary (Last 24 hours) at 01/23/14 0828 Last data filed at 01/23/14 0348  Gross per 24 hour  Intake    120 ml  Output    450 ml  Net   -330 ml   Filed Weights   01/18/14 0155  Weight: 88 kg (194 lb 0.1 oz)    Exam:   General:  alert  Cardiovascular: s1,s2 rrr  Respiratory: CTA BL  Abdomen: soft, nt, nd   Musculoskeletal: no LE edema   Data Reviewed: Basic Metabolic Panel:  Recent Labs Lab 01/17/14 2006 01/18/14 0535 01/19/14 0236  NA 135* 139  --   K 4.3 4.0  --   CL 97 104  --   CO2 23 23  --   GLUCOSE 126* 111*  --   BUN 25* 20  --   CREATININE 0.89 0.70  --   CALCIUM 9.2 8.1*  --   MG  --   --  1.7   Liver Function Tests:  Recent Labs Lab 01/17/14 2006  AST 26  ALT 17  ALKPHOS 104  BILITOT 0.4  PROT 6.8  ALBUMIN 3.3*   No results found for this  basename: LIPASE, AMYLASE,  in the last 168 hours No results found for this basename: AMMONIA,  in the last 168 hours CBC:  Recent Labs Lab 01/17/14 2002 01/18/14 0535 01/20/14 0807 01/21/14 0308  01/21/14 2310 01/22/14 0345 01/22/14 0749 01/22/14 1510 01/23/14 0051  WBC 12.0* 8.7 6.6 5.2  --   --  4.4  --   --   --   HGB 12.5 10.2* 9.5* 8.3*  < > 8.7* 8.9* 9.0* 8.9* 9.0*  HCT 37.4 31.9* 30.2* 26.3*  < > 27.1* 27.8* 27.4* 27.8* 27.6*  MCV 81.5 81.8 82.7 83.2  --   --  83.0  --   --   --   PLT 380 308 255 259  --   --  230  --   --   --   < > = values in this interval not displayed. Cardiac  Enzymes:  Recent Labs Lab 01/17/14 2002 01/17/14 2045 01/17/14 2345 01/18/14 0545 01/18/14 1130  TROPONINI 0.46* 0.33* 0.45* 0.57* 0.37*   BNP (last 3 results)  Recent Labs  01/17/14 2002  PROBNP 604.6*   CBG: No results found for this basename: GLUCAP,  in the last 168 hours  Recent Results (from the past 240 hour(s))  MRSA PCR SCREENING     Status: Abnormal   Collection Time    01/18/14  1:50 AM      Result Value Ref Range Status   MRSA by PCR POSITIVE (*) NEGATIVE Final   Comment:            The GeneXpert MRSA Assay (FDA     approved for NASAL specimens     only), is one component of a     comprehensive MRSA colonization     surveillance program. It is not     intended to diagnose MRSA     infection nor to guide or     monitor treatment for     MRSA infections.     RESULT CALLED TO, READ BACK BY AND VERIFIED WITHElmarie Mainland RN [id number] 01/18/14 MITCHELL,L     Studies: No results found.  Scheduled Meds: . [START ON 01/30/2014] cabergoline  0.25 mg Oral Q14 Days  . calcium carbonate  1 tablet Oral BID WC  . docusate sodium  100 mg Oral BID  . gabapentin  400 mg Oral Daily  . gabapentin  800 mg Oral QHS  . levofloxacin  500 mg Oral Daily  . levothyroxine  100 mcg Oral QAC breakfast  . mirtazapine  60 mg Oral QHS  . mometasone-formoterol  2 puff Inhalation BID  . mupirocin ointment  1 application Nasal BID  . pantoprazole  40 mg Oral Daily  . pramipexole  0.75 mg Oral QHS   Continuous Infusions: . heparin 850 Units/hr (01/23/14 0348)    Principal Problem:   Bilateral pulmonary embolism Active Problems:   PE (pulmonary embolism)   DVT (deep venous thrombosis)   Hypotension, unspecified    Time spent: >35 minute s    Kinnie Feil  [facility] Pager [phone]. If 7PM-7AM, please contact night-coverage at [email or web address], password [facility] 01/23/2014, 8:28 AM  LOS: 6 days

## 2014-01-23 NOTE — Progress Notes (Signed)
ANTICOAGULATION CONSULT NOTE - Follow Up Consult  Pharmacy Consult for Heparin  Indication: pulmonary embolus and DVT  Allergies  Allergen Reactions  . Clinoril [Sulindac] Rash  . Erythromycin Rash  . Penicillins Rash  . Prozac [Fluoxetine Hcl] Rash  . Sulfa Antibiotics Rash    Patient Measurements: Height: 5\' 4"  (162.6 cm) Weight: 194 lb 0.1 oz (88 kg) IBW/kg (Calculated) : 54.7  Vital Signs: Temp: 98.3 F (36.8 C) (02/22 0519) Temp src: Oral (02/22 0519) BP: 115/75 mmHg (02/22 0519) Pulse Rate: 78 (02/22 0519)  Labs:  Recent Labs  01/21/14 0308  01/21/14 1650  01/22/14 0345 01/22/14 0749 01/22/14 1510 01/23/14 0051 01/23/14 1230  HGB 8.3*  < >  --   < > 8.9* 9.0* 8.9* 9.0*  --   HCT 26.3*  < >  --   < > 27.8* 27.4* 27.8* 27.6*  --   PLT 259  --   --   --  230  --   --   --   --   APTT  --   < > 42*  --  153*  --  86* 106* 55*  HEPARINUNFRC  --   --  >2.20*  --  >2.20*  --   --   --  0.83*  < > = values in this interval not displayed.  Estimated Creatinine Clearance: 54.2 ml/min (by C-G formula based on Cr of 0.7).   Medications: Heparin 950 units/hr  Assessment: 80 y/o F on heparin for PE/DVT. Was on apixaban, so currently using aPTT to guide dosing. APTT was slightly elevated this AM at 106, and gtt rate was reduced.  Now PTT below goal at 55.  No further bleeding noted.  Heparin level remains elevated, likely from influence of Eliquis.  Will continue dosing heparin based on PTTs for now.   Goal of Therapy:  Heparin level 0.3-0.7 units/ml aPTT 66-102 seconds Monitor platelets by anticoagulation protocol: Yes   Plan:  -Increase heparin drip to 950 units/hr -Recheck PTT in 6 hrs. -Daily CBC/HL/aPTT until HL and aPTT correlate  -Monitor for bleeding  Uvaldo Rising, BCPS  Clinical Pharmacist Pager [phone]  01/23/2014 2:21 PM

## 2014-01-23 NOTE — Progress Notes (Signed)
ANTICOAGULATION CONSULT NOTE - Follow Up Consult  Pharmacy Consult for Heparin  Indication: pulmonary embolus and DVT  Allergies  Allergen Reactions  . Clinoril [Sulindac] Rash  . Erythromycin Rash  . Penicillins Rash  . Prozac [Fluoxetine Hcl] Rash  . Sulfa Antibiotics Rash    Patient Measurements: Height: 5\' 4"  (162.6 cm) Weight: 194 lb 0.1 oz (88 kg) IBW/kg (Calculated) : 54.7  Vital Signs: Temp: 98.6 F (37 C) (02/21 2043) Temp src: Oral (02/21 2043) BP: 106/63 mmHg (02/21 2043) Pulse Rate: 99 (02/21 2043)  Labs:  Recent Labs  01/20/14 0807 01/21/14 0308  01/21/14 1650  01/22/14 0345 01/22/14 0749 01/22/14 1510 01/23/14 0051  HGB 9.5* 8.3*  < >  --   < > 8.9* 9.0* 8.9* 9.0*  HCT 30.2* 26.3*  < >  --   < > 27.8* 27.4* 27.8* 27.6*  PLT 255 259  --   --   --  230  --   --   --   APTT  --   --   < > 42*  --  153*  --  86* 106*  HEPARINUNFRC  --   --   --  >2.20*  --  >2.20*  --   --   --   < > = values in this interval not displayed.  Estimated Creatinine Clearance: 54.2 ml/min (by C-G formula based on Cr of 0.7).   Medications: Heparin 950 units/hr  Assessment: 80 y/o F on heparin for PE/DVT. Was on apixaban, so currently using aPTT to guide dosing. APTT is slightly elevated this AM at 106. Other labs as above. No issues per RN.   Goal of Therapy:  Heparin level 0.3-0.7 units/ml aPTT 66-102 seconds Monitor platelets by anticoagulation protocol: Yes   Plan:  -Decrease heparin drip to 850 units/hr -1200 aPTT/HL -Daily CBC/HL/aPTT until HL and aPTT correlate  -Monitor for bleeding  Narda Bonds, PharmD 01/23/2014,3:31 AM

## 2014-01-23 NOTE — Progress Notes (Signed)
No further reports of any type of bleeding. Patient is on heparin for pulmonary emboli. Prior EGD unrevealing for a source of bleeding. Hemoglobin is stable.  At this time I would only observe her for further bleeding. No plans for further GI intervention at this time.  We will sign off. Call us again if needed.

## 2014-01-24 ENCOUNTER — Encounter (HOSPITAL_COMMUNITY): Payer: Self-pay | Admitting: Anesthesiology

## 2014-01-24 ENCOUNTER — Inpatient Hospital Stay (HOSPITAL_COMMUNITY): Payer: Medicare Other

## 2014-01-24 ENCOUNTER — Encounter (HOSPITAL_COMMUNITY)
Admission: EM | Disposition: A | Discharge status: Skilled Nursing Facility | Payer: Self-pay | Source: Home / Self Care | Attending: Internal Medicine

## 2014-01-24 ENCOUNTER — Encounter (HOSPITAL_COMMUNITY): Payer: Medicare Other | Admitting: Anesthesiology

## 2014-01-24 ENCOUNTER — Inpatient Hospital Stay (HOSPITAL_COMMUNITY): Payer: Medicare Other | Admitting: Anesthesiology

## 2014-01-24 HISTORY — PX: VENA CAVA FILTER PLACEMENT: SHX1085

## 2014-01-24 LAB — CBC
HCT: 25.3 % — ABNORMAL LOW (ref 36.0–46.0)
HCT: 26.2 % — ABNORMAL LOW (ref 36.0–46.0)
HEMOGLOBIN: 8.1 g/dL — AB (ref 12.0–15.0)
HEMOGLOBIN: 8.3 g/dL — AB (ref 12.0–15.0)
MCH: 26.6 pg (ref 26.0–34.0)
MCH: 26.5 pg (ref 26.0–34.0)
MCHC: 32 g/dL (ref 30.0–36.0)
MCHC: 31.7 g/dL (ref 30.0–36.0)
MCV: 83.2 fL (ref 78.0–100.0)
MCV: 83.7 fL (ref 78.0–100.0)
Platelets: 217 10*3/uL (ref 150–400)
Platelets: 239 10*3/uL (ref 150–400)
RBC: 3.04 MIL/uL — AB (ref 3.87–5.11)
RBC: 3.13 MIL/uL — AB (ref 3.87–5.11)
RDW: 16.1 % — ABNORMAL HIGH (ref 11.5–15.5)
RDW: 16 % — ABNORMAL HIGH (ref 11.5–15.5)
WBC: 4.3 10*3/uL (ref 4.0–10.5)
WBC: 4.3 10*3/uL (ref 4.0–10.5)

## 2014-01-24 LAB — BASIC METABOLIC PANEL
BUN: 9 mg/dL (ref 6–23)
CO2: 28 mEq/L (ref 19–32)
Calcium: 8.4 mg/dL (ref 8.4–10.5)
Chloride: 98 mEq/L (ref 96–112)
Creatinine, Ser: 0.65 mg/dL (ref 0.50–1.10)
GFR calc Af Amer: >90 mL/min (ref >90)
GFR calc non Af Amer: 78 mL/min — ABNORMAL LOW (ref >90)
Glucose, Bld: 86 mg/dL (ref 70–99)
Potassium: 3.9 mEq/L (ref 3.7–5.3)
Sodium: 135 mEq/L — ABNORMAL LOW (ref 137–147)

## 2014-01-24 LAB — PROTIME-INR
INR: 1.13 (ref 0.00–1.49)
Prothrombin Time: 14.3 seconds (ref 11.6–15.2)

## 2014-01-24 LAB — APTT
APTT: 70 s — AB (ref 24–37)
aPTT: 109 seconds — ABNORMAL HIGH (ref 24–37)

## 2014-01-24 LAB — HEPARIN LEVEL (UNFRACTIONATED)
HEPARIN UNFRACTIONATED: 0.58 [IU]/mL (ref 0.30–0.70)
Heparin Unfractionated: 0.33 IU/mL (ref 0.30–0.70)

## 2014-01-24 SURGERY — INSERTION, FILTER, VENA CAVA
Anesthesia: General | Site: Groin | Laterality: Right

## 2014-01-24 MED ORDER — FENTANYL CITRATE 0.05 MG/ML IJ SOLN
INTRAMUSCULAR | Status: AC
  Filled 2014-01-24: qty 5

## 2014-01-24 MED ORDER — SUCCINYLCHOLINE CHLORIDE 20 MG/ML IJ SOLN
INTRAMUSCULAR | Status: AC
  Filled 2014-01-24: qty 1

## 2014-01-24 MED ORDER — PHENYLEPHRINE 40 MCG/ML (10ML) SYRINGE FOR IV PUSH (FOR BLOOD PRESSURE SUPPORT)
PREFILLED_SYRINGE | INTRAVENOUS | Status: AC
  Filled 2014-01-24: qty 10

## 2014-01-24 MED ORDER — COUMADIN BOOK
Freq: Once | Status: AC
  Administered 2014-01-24: 15:00:00
  Filled 2014-01-24: qty 1

## 2014-01-24 MED ORDER — LIDOCAINE-EPINEPHRINE 0.5 %-1:200000 IJ SOLN
INTRAMUSCULAR | Status: AC
  Filled 2014-01-24: qty 1

## 2014-01-24 MED ORDER — ETOMIDATE 2 MG/ML IV SOLN
INTRAVENOUS | Status: AC
  Filled 2014-01-24: qty 10

## 2014-01-24 MED ORDER — SODIUM CHLORIDE 0.9 % IR SOLN
Status: DC | PRN
  Administered 2014-01-24: 08:00:00

## 2014-01-24 MED ORDER — CALCIUM CARBONATE ANTACID 500 MG PO CHEW
1.0000 | CHEWABLE_TABLET | ORAL | Status: DC | PRN
  Administered 2014-01-24 – 2014-01-27 (×3): 200 mg via ORAL
  Filled 2014-01-24 (×3): qty 1

## 2014-01-24 MED ORDER — ARTIFICIAL TEARS OP OINT
TOPICAL_OINTMENT | OPHTHALMIC | Status: AC
  Filled 2014-01-24: qty 3.5

## 2014-01-24 MED ORDER — PROPOFOL 10 MG/ML IV BOLUS
INTRAVENOUS | Status: AC
  Filled 2014-01-24: qty 20

## 2014-01-24 MED ORDER — ONDANSETRON HCL 4 MG/2ML IJ SOLN
INTRAMUSCULAR | Status: AC
  Filled 2014-01-24: qty 2

## 2014-01-24 MED ORDER — FENTANYL CITRATE 0.05 MG/ML IJ SOLN: 25.0000 ug | INTRAMUSCULAR | Status: DC | PRN

## 2014-01-24 MED ORDER — EPHEDRINE SULFATE 50 MG/ML IJ SOLN
INTRAMUSCULAR | Status: AC
  Filled 2014-01-24: qty 1

## 2014-01-24 MED ORDER — WARFARIN - PHARMACIST DOSING INPATIENT
Freq: Every day | Status: DC
  Administered 2014-01-29 – 2014-01-30 (×2)

## 2014-01-24 MED ORDER — FENTANYL CITRATE 0.05 MG/ML IJ SOLN
INTRAMUSCULAR | Status: DC | PRN
  Administered 2014-01-24: 50 ug via INTRAVENOUS

## 2014-01-24 MED ORDER — LACTATED RINGERS IV SOLN
INTRAVENOUS | Status: DC | PRN
  Administered 2014-01-24: 07:00:00 via INTRAVENOUS

## 2014-01-24 MED ORDER — PROMETHAZINE HCL 25 MG/ML IJ SOLN: 6.2500 mg | INTRAMUSCULAR | Status: DC | PRN

## 2014-01-24 MED ORDER — IOHEXOL 300 MG/ML  SOLN
INTRAMUSCULAR | Status: DC | PRN
  Administered 2014-01-24: 50 mL via INTRA_ARTERIAL

## 2014-01-24 MED ORDER — WARFARIN SODIUM 2.5 MG PO TABS
2.5000 mg | ORAL_TABLET | Freq: Once | ORAL | Status: AC
  Administered 2014-01-24: 2.5 mg via ORAL
  Filled 2014-01-24 (×2): qty 1

## 2014-01-24 MED ORDER — VANCOMYCIN HCL IN DEXTROSE 1-5 GM/200ML-% IV SOLN
1000.0000 mg | INTRAVENOUS | Status: AC
  Administered 2014-01-24: 1000 mg via INTRAVENOUS
  Filled 2014-01-24 (×2): qty 200

## 2014-01-24 MED ORDER — MIDAZOLAM HCL 2 MG/2ML IJ SOLN
INTRAMUSCULAR | Status: AC
  Filled 2014-01-24: qty 2

## 2014-01-24 MED ORDER — LIDOCAINE-EPINEPHRINE 0.5 %-1:200000 IJ SOLN
INTRAMUSCULAR | Status: DC | PRN
  Administered 2014-01-24: 50 mL

## 2014-01-24 MED ORDER — MIDAZOLAM HCL 5 MG/5ML IJ SOLN
INTRAMUSCULAR | Status: DC | PRN
  Administered 2014-01-24: 1 mg via INTRAVENOUS

## 2014-01-24 MED ORDER — 0.9 % SODIUM CHLORIDE (POUR BTL) OPTIME
TOPICAL | Status: DC | PRN
  Administered 2014-01-24: 1000 mL

## 2014-01-24 MED ORDER — WARFARIN VIDEO
Freq: Once | Status: AC
  Administered 2014-01-25: 18:00:00

## 2014-01-24 MED ORDER — SODIUM CHLORIDE 0.9 % IJ SOLN
INTRAMUSCULAR | Status: AC
  Filled 2014-01-24: qty 10

## 2014-01-24 SURGICAL SUPPLY — 40 items
BAG BANDED W/RUBBER/TAPE 36X54 (MISCELLANEOUS) ×2 IMPLANT
BAG DECANTER FOR FLEXI CONT (MISCELLANEOUS) ×2 IMPLANT
BLADE SURG 11 STRL SS (BLADE) ×2 IMPLANT
BLADE SURG ROTATE 9660 (MISCELLANEOUS) ×2 IMPLANT
COVER DOME SNAP 22 D (MISCELLANEOUS) ×2 IMPLANT
COVER SURGICAL LIGHT HANDLE (MISCELLANEOUS) ×2 IMPLANT
DRAPE C-ARM 42X72 X-RAY (DRAPES) IMPLANT
DRAPE LAPAROTOMY T 102X78X121 (DRAPES) ×2 IMPLANT
FILTER VC CELECT-FEMORAL (Filter) ×2 IMPLANT
GAUZE SPONGE 4X4 16PLY XRAY LF (GAUZE/BANDAGES/DRESSINGS) ×2 IMPLANT
GLOVE BIOGEL PI IND STRL 6.5 (GLOVE) ×1 IMPLANT
GLOVE BIOGEL PI INDICATOR 6.5 (GLOVE) ×1
GLOVE ECLIPSE 7.5 STRL STRAW (GLOVE) ×2 IMPLANT
GLOVE SS BIOGEL STRL SZ 7.5 (GLOVE) ×1 IMPLANT
GLOVE SUPERSENSE BIOGEL SZ 7.5 (GLOVE) ×1
GOWN STRL REUS W/ TWL LRG LVL3 (GOWN DISPOSABLE) ×1 IMPLANT
GOWN STRL REUS W/ TWL XL LVL3 (GOWN DISPOSABLE) ×1 IMPLANT
GOWN STRL REUS W/TWL LRG LVL3 (GOWN DISPOSABLE) ×1
GOWN STRL REUS W/TWL XL LVL3 (GOWN DISPOSABLE) ×1
KIT BASIN OR (CUSTOM PROCEDURE TRAY) ×2 IMPLANT
KIT ROOM TURNOVER OR (KITS) IMPLANT
NEEDLE 22X1 1/2 (OR ONLY) (NEEDLE) ×2 IMPLANT
NEEDLE HYPO 25GX1X1/2 BEV (NEEDLE) ×2 IMPLANT
NEEDLE PERC 18GX7CM (NEEDLE) ×2 IMPLANT
NS IRRIG 1000ML POUR BTL (IV SOLUTION) ×2 IMPLANT
PACK SURGICAL SETUP 50X90 (CUSTOM PROCEDURE TRAY) ×2 IMPLANT
PAD ARMBOARD 7.5X6 YLW CONV (MISCELLANEOUS) ×4 IMPLANT
SHEATH BRITE TIP 7FRX11 (SHEATH) ×2 IMPLANT
SPONGE GAUZE 4X4 12PLY (GAUZE/BANDAGES/DRESSINGS) ×2 IMPLANT
SPONGE LAP 18X18 X RAY DECT (DISPOSABLE) ×2 IMPLANT
SUT ETHILON 3 0 PS 1 (SUTURE) ×2 IMPLANT
SYR 20CC LL (SYRINGE) ×2 IMPLANT
SYR 20ML ECCENTRIC (SYRINGE) ×2 IMPLANT
SYR 5ML LL (SYRINGE) ×2 IMPLANT
SYR CONTROL 10ML LL (SYRINGE) ×2 IMPLANT
TAPE CLOTH SURG 4X10 WHT LF (GAUZE/BANDAGES/DRESSINGS) ×2 IMPLANT
TOWEL OR 17X24 6PK STRL BLUE (TOWEL DISPOSABLE) ×2 IMPLANT
TOWEL OR 17X26 10 PK STRL BLUE (TOWEL DISPOSABLE) ×2 IMPLANT
WATER STERILE IRR 1000ML POUR (IV SOLUTION) IMPLANT
WIRE J 3MM .035X145CM (WIRE) ×2 IMPLANT

## 2014-01-24 NOTE — Op Note (Signed)
    OPERATIVE REPORT  DATE OF SURGERY: 01/24/2014  PATIENT: Crystal [name], 80 y.o. female MRN: [id number]  DOB: Dec 31, 1924  PRE-OPERATIVE DIAGNOSIS: DVT and pulmonary embolus with contraindication to anticoagulation   POST-OPERATIVE DIAGNOSIS:  Same  PROCEDURE: Vena cava filter placement through percutaneous right femoral approach  SURGEON:  Curt Jews, M.D.  PHYSICIAN ASSISTANT: Nurse  ANESTHESIA:  Local with IV sedation  EBL: Minimal ml  Total I/O In: 200 [I.V.:200] Out: -   BLOOD ADMINISTERED: None  DRAINS: None  SPECIMEN: None  COUNTS CORRECT:  YES  PLAN OF CARE: PACU   PATIENT DISPOSITION:  PACU - hemodynamically stable  PROCEDURE DETAILS: The patient was taken and placed supine position where the area of both groins were prepped and draped in usual sterile fashion. Using SonoSite ultrasound the right common femoral vein was accessed with an 18-gauge needle under local anesthesia. A guidewire easily passed up to the level of the vena cava. Fluoroscopy was used to image the wire. A long 7 French sheath was passed over the guidewire and the guidewire was removed. The inferior venacavogram was obtained with a hand injection through the sheath. This did reveal the level of the renal veins. This area was marked on the patient. The dilator was removed and the vena cava filter was positioned through the long sheath. This was positioned at the level that had been marked which was in the inferior vena cava below the level of the renal veins. The filter was released from the delivery system and was of good apposition. The delivery sheath was removed. Pressure was held for hemostasis. The patient was transferred to the recovery room where KUB was obtained   Curt Jews, M.D. 01/24/2014 8:39 AM

## 2014-01-24 NOTE — Progress Notes (Signed)
ANTICOAGULATION CONSULT NOTE - Follow Up Consult  Pharmacy Consult for Heparin  Indication: pulmonary embolus and DVT  Allergies  Allergen Reactions  . Clinoril [Sulindac] Rash  . Erythromycin Rash  . Penicillins Rash  . Prozac [Fluoxetine Hcl] Rash  . Sulfa Antibiotics Rash    Patient Measurements: Height: 5\' 4"  (162.6 cm) Weight: 194 lb 0.1 oz (88 kg) IBW/kg (Calculated) : 54.7  Vital Signs: Temp: 98.1 F (36.7 C) (02/23 0831) Temp src: Oral (02/23 0418) BP: 121/53 mmHg (02/23 0831) Pulse Rate: 70 (02/23 0833)  Labs:  Recent Labs  01/22/14 0345  01/22/14 1510 01/23/14 0051 01/23/14 1230 01/23/14 2007 01/24/14 0400  HGB 8.9*  < > 8.9* 9.0*  --   --  8.1*  HCT 27.8*  < > 27.8* 27.6*  --   --  25.3*  PLT 230  --   --   --   --   --  217  APTT 153*  --  86* 106* 55* 94* 109*  HEPARINUNFRC >2.20*  --   --   --  0.83*  --  0.58  < > = values in this interval not displayed.  Estimated Creatinine Clearance: 54.2 ml/min (by C-G formula based on Cr of 0.7).   Medications: . heparin 950 Units/hr (01/23/14 1500)    Assessment: 80 y/o F on heparin for PE/DVT. Was on apixaban, so currently using aPTT to guide dosing. aPTT is  slightly elevated this am at 109.  Pt underwent placement of IVC this morning. Heparin level is therapeutic, but will continue to dose based on aPTT until the two levels correlate.  No bleeding is noted, pt had slight drop in hemoglobin this morning to 8.1, platelets remain stable.  Initiating warfarin as well, due to patient's age and history will begin dosing conservatively.  Goal of Therapy:  Heparin level 0.3-0.7 units/ml aPTT 66-102 seconds Monitor platelets by anticoagulation protocol: Yes   Plan:  -Pt previously subtherapeutic on heparin 850 units/hr, will make minor decrease to 900 units/hr and check level again in 8 hours.  -Next aPTT/HL at 2200 tonight -Daily CBC/HL/aPTT until HL and aPTT correlate  -Daily INR -Monitor for  bleeding -Warfarin 2.5 mg po x1

## 2014-01-24 NOTE — Progress Notes (Signed)
Physical Therapy Note  Pt off of floor for procedure.  Will check back tomorrow.    [place], [place]

## 2014-01-24 NOTE — Progress Notes (Signed)
CSW continuing to follow patient for when ready for dc.  Jeanette Caprice, MSW, [place]

## 2014-01-24 NOTE — Progress Notes (Signed)
ANTICOAGULATION CONSULT NOTE - Follow Up Consult  Pharmacy Consult for Heparin Indication: PE/DVT  Allergies  Allergen Reactions  . Clinoril [Sulindac] Rash  . Erythromycin Rash  . Penicillins Rash  . Prozac [Fluoxetine Hcl] Rash  . Sulfa Antibiotics Rash    Patient Measurements: Height: 5\' 4"  (162.6 cm) Weight: 194 lb 0.1 oz (88 kg) IBW/kg (Calculated) : 54.7  Vital Signs: Temp: 98.9 F (37.2 C) (02/23 2213) Temp src: Oral (02/23 2213) BP: 123/76 mmHg (02/23 2213) Pulse Rate: 80 (02/23 2213)  Labs:  Recent Labs  01/22/14 0345  01/23/14 0051 01/23/14 1230 01/23/14 2007 01/24/14 0400 01/24/14 1045 01/24/14 1445 01/24/14 2146  HGB 8.9*  < > 9.0*  --   --  8.1* 8.3*  --   --   HCT 27.8*  < > 27.6*  --   --  25.3* 26.2*  --   --   PLT 230  --   --   --   --  217 239  --   --   APTT 153*  < > 106* 55* 94* 109*  --   --  70*  LABPROT  --   --   --   --   --   --   --  14.3  --   INR  --   --   --   --   --   --   --  1.13  --   HEPARINUNFRC >2.20*  --   --  0.83*  --  0.58  --   --  0.33  CREATININE  --   --   --   --   --   --  0.65  --   --   < > = values in this interval not displayed.  Estimated Creatinine Clearance: 54.2 ml/min (by C-G formula based on Cr of 0.65).   Medications:  Heparin 900 units/hr  Assessment: 88yof on heparin for PE/DVT. Heparin rate was adjusted down slightly this AM for elevated PTT. Heparin was then turned off ~0700 for IVC filter and restarted ~1420. PTT (70) and heparin level (0.33) are therapeutic at low end of range and most likely trending up based on restart time - will continue current rate and follow-up AM levels. - H/H and Plts stable - No significant bleeding per RN  Goal of Therapy:  Heparin level 0.3-0.7 units/ml aPTT 66-102 seconds Monitor platelets by anticoagulation protocol: Yes   Plan:  1. Continue heparin drip 900 units/hr (9 ml/hr) 2. Heparin level and PTT are correlating  - will proceed to monitor with  heparin levels only 3. Follow-up AM heparin level, CBC and INR 4. Monitor for s/sx of bleeding  Earleen Newport [phone] 01/24/2014,10:38 PM

## 2014-01-24 NOTE — Interval H&P Note (Signed)
History and Physical Interval Note:  01/24/2014 7:12 AM  Crystal [name]  has presented today for surgery, with the diagnosis of Clotting Disorder  The various methods of treatment have been discussed with the patient and family. After consideration of risks, benefits and other options for treatment, the patient has consented to  Procedure(s): INSERTION VENA-CAVA FILTER (N/A) as a surgical intervention .  The patient's history has been reviewed, patient examined, no change in status, stable for surgery.  I have reviewed the patient's chart and labs.  Questions were answered to the patient's satisfaction.     Aishani Kalis

## 2014-01-24 NOTE — Anesthesia Postprocedure Evaluation (Signed)
  Anesthesia Post-op Note  Patient: Crystal [name]  Procedure(s) Performed: Procedure(s): INSERTION VENA-CAVA FILTER; ULTRASOUND GUIDED (Right)  Patient Location: PACU  Anesthesia Type:MAC  Level of Consciousness: awake  Airway and Oxygen Therapy: Patient Spontanous Breathing  Post-op Pain: 1 /10  Post-op Assessment: Post-op Vital signs reviewed  Post-op Vital Signs: stable  Complications: No apparent anesthesia complications

## 2014-01-24 NOTE — Preoperative (Signed)
Beta Blockers   Reason not to administer Beta Blockers:Not Applicable 

## 2014-01-24 NOTE — Progress Notes (Signed)
[facility] PROGRESS NOTE  Crystal [name] [id number] DOB: [birth year] DOA: 01/17/2014 PCP: Mathews Argyle, MD  Assessment/Plan: 80 y.o. female admitted on 01/17/2014 with a past medical history of Hypertension; Arthritis; GERD; Polio; Hyperthyroidism; Peripheral neuropathy; Shingles; IBS; Allergic sinusitis; Asthma; Diverticulosis; Benign tumor of pituitary gland (1999); IBS; and Uterine cancer who presented with a complaint of dyspnea on exertion and extreme fatigue for 1 wk now. She also c/o chest tightness on admission. She was found to have extensive PEs per CT scan -started on [facility] developed GIB  1. Bilateral pulmonary emboli; patient is evaluated by Pulm recommended  lifelong AC tx  -echo: LVEF 65%, reduced RV function; pulmonary HTN; no s/s of fluids overload; monitor off diuretics  -she was on IV heparin --> transitioned to Eliquis 2/17 -2/20: + melena, +occult blood; -->d/c apixaban, start IV heparin due to significant PE; s/p EGD no acute bleeding  -2/23: s/p IVC; but needs anticoagulation for significant PE; will start coumadin+heparin to be able to reverse if re bleeds; cont monitor   2. B LE Posterior Tibial V DVT anticoag as noted above  -s/p IVC 2/23; appreciate vascular surgery evaluation for IVC filter   3. Acute blood loss anemia on anticoagulation with GIB; d/c apixaban 2/20;  -Tf sed 1 unit 2/20; monitor Hg, TF prn;  -s/p EGD: gastric polys no obvious source for bleeding; appreciate GI evaluation    3. Hypotension with h/o HTN hold antihypertensives  4. COPD No longer smoking , reports cough, cont bronchodilators, added advair, levofloxacin (will d/c on 2/24)   5. GERD/ Diverticulosis  , may need colonoscopy if re bleeds  6. Hypothyroid cont Synthroid   7. History of polio with right upper extremity weakness -PT recommended SNF   D/w patient , her son extensively about her medical condition, management, prognosis, VTE, anticoagulation, bleeding,  complications;  -  Prognosis guarded   Code Status: full Family Communication: d/w patient, updated [name] [phone]  (indicate person spoken with, relationship, and if by phone, the number) Disposition Plan: SNF when clinically stable   Consultants:  Pulmonary   Procedures:  Non e  Antibiotics:  Levofloxacin 2/19 (indicate start date, and stop date if known)  HPI/Subjective: alert  Objective: Filed Vitals:   01/24/14 0833  BP:   Pulse: 70  Temp:   Resp: 17    Intake/Output Summary (Last 24 hours) at 01/24/14 1158 Last data filed at 01/24/14 0752  Gross per 24 hour  Intake  424.5 ml  Output      0 ml  Net  424.5 ml   Filed Weights   01/18/14 0155  Weight: 88 kg (194 lb 0.1 oz)    Exam:   General:  alert  Cardiovascular: s1,s2 rrr  Respiratory: CTA BL  Abdomen: soft, nt, nd   Musculoskeletal: no LE edema   Data Reviewed: Basic Metabolic Panel:  Recent Labs Lab 01/17/14 2006 01/18/14 0535 01/19/14 0236 01/24/14 1045  NA 135* 139  --  135*  K 4.3 4.0  --  3.9  CL 97 104  --  98  CO2 23 23  --  28  GLUCOSE 126* 111*  --  86  BUN 25* 20  --  9  CREATININE 0.89 0.70  --  0.65  CALCIUM 9.2 8.1*  --  8.4  MG  --   --  1.7  --    Liver Function Tests:  Recent Labs Lab 01/17/14 2006  AST 26  ALT 17  ALKPHOS 104  BILITOT 0.4  PROT 6.8  ALBUMIN 3.3*   No results found for this basename: LIPASE, AMYLASE,  in the last 168 hours No results found for this basename: AMMONIA,  in the last 168 hours CBC:  Recent Labs Lab 01/20/14 0807 01/21/14 0308  01/22/14 0345 01/22/14 0749 01/22/14 1510 01/23/14 0051 01/24/14 0400 01/24/14 1045  WBC 6.6 5.2  --  4.4  --   --   --  4.3 4.3  HGB 9.5* 8.3*  < > 8.9* 9.0* 8.9* 9.0* 8.1* 8.3*  HCT 30.2* 26.3*  < > 27.8* 27.4* 27.8* 27.6* 25.3* 26.2*  MCV 82.7 83.2  --  83.0  --   --   --  83.2 83.7  PLT 255 259  --  230  --   --   --  217 239  < > = values in this interval not  displayed. Cardiac Enzymes:  Recent Labs Lab 01/17/14 2002 01/17/14 2045 01/17/14 2345 01/18/14 0545 01/18/14 1130  TROPONINI 0.46* 0.33* 0.45* 0.57* 0.37*   BNP (last 3 results)  Recent Labs  01/17/14 2002  PROBNP 604.6*   CBG: No results found for this basename: GLUCAP,  in the last 168 hours  Recent Results (from the past 240 hour(s))  MRSA PCR SCREENING     Status: Abnormal   Collection Time    01/18/14  1:50 AM      Result Value Ref Range Status   MRSA by PCR POSITIVE (*) NEGATIVE Final   Comment:            The GeneXpert MRSA Assay (FDA     approved for NASAL specimens     only), is one component of a     comprehensive MRSA colonization     surveillance program. It is not     intended to diagnose MRSA     infection nor to guide or     monitor treatment for     MRSA infections.     RESULT CALLED TO, READ BACK BY AND VERIFIED WITHElmarie Mainland RN [id number] 01/18/14 MITCHELL,L     Studies: Dg Abd 1 View - Kub  01/24/2014   CLINICAL DATA:  IVC filter check.  EXAM: ABDOMEN - 1 VIEW  COMPARISON:  None.  FINDINGS: IVC filter is in place extending from L1-2 to the L3-4 level. There is a large volume of stool present in the colon. No evidence of small bowel obstruction is identified. Postoperative change lower lumbar spine is noted.  IMPRESSION: IVC filter is in place.  No acute abnormality.   Electronically Signed   By: Inge Rise M.D.   On: 01/24/2014 10:22    Scheduled Meds: . [START ON 01/30/2014] cabergoline  0.25 mg Oral Q14 Days  . calcium carbonate  1 tablet Oral BID WC  . docusate sodium  100 mg Oral BID  . gabapentin  400 mg Oral Daily  . gabapentin  800 mg Oral QHS  . guaiFENesin  600 mg Oral Q6H  . levofloxacin  500 mg Oral Daily  . levothyroxine  100 mcg Oral QAC breakfast  . mirtazapine  60 mg Oral QHS  . mometasone-formoterol  2 puff Inhalation BID  . pantoprazole  40 mg Oral Daily  . pramipexole  0.75 mg Oral QHS   Continuous Infusions: .  heparin 950 Units/hr (01/23/14 1500)    Principal Problem:   Bilateral pulmonary embolism Active Problems:   PE (pulmonary embolism)   DVT (deep venous thrombosis)  Hypotension, unspecified    Time spent: >35 minute s    Kinnie Feil  [facility] Pager [phone]. If 7PM-7AM, please contact night-coverage at [email or web address], password [facility] 01/24/2014, 11:58 AM  LOS: 7 days

## 2014-01-24 NOTE — H&P (View-Only) (Signed)
[facility] [place]  Reason for Consult:  Bilateral PE/DVT-IVC placement Referring Physician:  Daleen Bo MRN #:  [id number]  History of Present Illness: This is a 80 y.o. female who was admitted earlier this week with shortness of breath with activity.  She states that a nurse came to her house a couple of weeks ago and sent her to the hospital for leg swelling.  Pt states she had a doppler at that time and it was negative for DVT and was sent back home.  She also has had a sinus infection and was on ABx and just didn't feel well.  She states that her shortness of breath worsened and she had to call 911.  She was brought to the hospital and found to have bilateral pulmonary emboli and bilateral DVT in the posterior tibial veins.    Pt did have a positive hemooccult sample.  She had been placed on Eliquis with her first dose 2/17 and her last dose being this am.  They are going to discontinue this at this time and restart her heparin.  She states that she has a remote hx of tobacco use, but quit ~ 40 years ago.  She ambulates at home and with a cane when she goes out.  [facility] is consulted for placement of IVC filter.    Past Medical History  Diagnosis Date  . Hypertension   . Arthritis   . GERD (gastroesophageal reflux disease)   . Polio   . Hyperthyroidism   . Peripheral neuropathy   . Shingles   . IBS (irritable bowel syndrome)   . Allergic sinusitis   . Asthma   . Diverticulosis   . Benign tumor of pituitary gland 1999  . IBS (irritable bowel syndrome)   . Cancer   . Uterine cancer    Past Surgical History  Procedure Laterality Date  . Replacement total knee    . Total shoulder replacement    . Tonsillectomy    . Carpal tunnel release Left   . Cataract extraction Right   . Cervical polypectomy      Allergies  Allergen Reactions  . Clinoril [Sulindac] Rash  . Erythromycin Rash  . Penicillins Rash  . Prozac [Fluoxetine Hcl] Rash  . Sulfa  Antibiotics Rash    Prior to Admission medications   Medication Sig Start Date End Date Taking? Authorizing Provider  albuterol (PROVENTIL HFA;VENTOLIN HFA) 108 (90 BASE) MCG/ACT inhaler Inhale 2 puffs into the lungs every 6 (six) hours as needed for wheezing or shortness of breath.   Yes Historical Provider, MD  amLODipine (NORVASC) 5 MG tablet Take 2.5 mg by mouth daily.   Yes Historical Provider, MD  beclomethasone (QVAR) 80 MCG/ACT inhaler Inhale 1 puff into the lungs 2 (two) times daily.   Yes Historical Provider, MD  benazepril (LOTENSIN) 10 MG tablet Take 10 mg by mouth daily.   Yes Historical Provider, MD  cabergoline (DOSTINEX) 0.5 MG tablet Take 0.25 mg by mouth See admin instructions. Takes 1 every other week   Yes Historical Provider, MD  calcium carbonate (OS-CAL - DOSED IN MG OF ELEMENTAL CALCIUM) 1250 MG tablet Take 1 tablet by mouth 2 (two) times daily.   Yes Historical Provider, MD  Cholecalciferol (VITAMIN D PO) Take 1 tablet by mouth daily.   Yes Historical Provider, MD  doxycycline (DORYX) 100 MG EC tablet Take 100 mg by mouth 2 (two) times daily. Dispensed 12/31/13. 10 day supply   Yes Historical Provider, MD  gabapentin (NEURONTIN) 400 MG capsule Take 400-800 mg by mouth 2 (two) times daily. Take 400mg  in the morning and 800mg  in the evening   Yes Historical Provider, MD  guaiFENesin (MUCINEX) 600 MG 12 hr tablet Take 600 mg by mouth 2 (two) times daily as needed for cough or to loosen phlegm.   Yes Historical Provider, MD  levothyroxine (SYNTHROID, LEVOTHROID) 100 MCG tablet Take 100 mcg by mouth daily before breakfast.   Yes Historical Provider, MD  mirtazapine (REMERON) 30 MG tablet Take 60 mg by mouth at bedtime.   Yes Historical Provider, MD  naproxen sodium (ANAPROX) 220 MG tablet Take 220 mg by mouth 2 (two) times daily with a meal.   Yes Historical Provider, MD  Omega-3 Fatty Acids (FISH OIL) 1200 MG CAPS Take 1,200 mg by mouth daily.   Yes Historical Provider, MD   omeprazole (PRILOSEC OTC) 20 MG tablet Take 20 mg by mouth daily.   Yes Historical Provider, MD  pramipexole (MIRAPEX) 0.75 MG tablet Take 0.75 mg by mouth at bedtime.   Yes Historical Provider, MD  Pseudoephedrine-APAP-DM (DAYQUIL PO) Take 15 mLs by mouth daily as needed (cold symptoms).   Yes Historical Provider, MD  ranitidine (ZANTAC) 300 MG tablet Take 300 mg by mouth daily as needed for heartburn.   Yes Historical Provider, MD  traZODone (DESYREL) 50 MG tablet Take 50 mg by mouth at bedtime as needed for sleep.   Yes Historical Provider, MD  vitamin B-12 (CYANOCOBALAMIN) 1000 MCG tablet Take 1,000 mcg by mouth daily.   Yes Historical Provider, MD  vitamin E 100 UNIT capsule Take 100 Units by mouth daily.   Yes Historical Provider, MD    History   Social History  . Marital Status: Widowed    Spouse Name: N/A    Number of Children: N/A  . Years of Education: N/A   Occupational History  . Not on file.   Social History Main Topics  . Smoking status: Former Research scientist (life sciences)  . Smokeless tobacco: Not on file  . Alcohol Use: No  . Drug Use: Not on file  . Sexual Activity: Not on file   Other Topics Concern  . Not on file   Social History Narrative  . No narrative on file     History reviewed. No pertinent family history.  ROS: [x]  Positive   [ ]  Negative   [ ]  All sytems reviewed and are negative  Cardiovascular: []  chest tightness []  palpitations []  SOB [x]  PE-bilateral [x]  DOE []  pain in legs while walking []  pain in legs at rest []  pain in legs at night []  non-healing ulcers [x]  hx of DVT [x]  swelling in legs  Pulmonary: []  productive cough []  asthma/wheezing []  home O2  Neurologic: []  weakness in []  arms []  legs []  numbness in []  arms []  legs []  hx of CVA []  mini stroke [] difficulty speaking or slurred speech []  temporary loss of vision in one eye []  dizziness  Hematologic: [x]  hx of cancer-uterine []  bleeding problems [x]  problems with blood clotting  easily  Endocrine:   []  diabetes [x]  thyroid disease  GI []  vomiting blood [x]  blood in stool [x]  GERD/hiatal hernia  GU: []  CKD/renal failure []  HD--[]  M/W/F or []  T/T/S []  burning with urination []  blood in urine  Psychiatric: []  anxiety []  depression  Musculoskeletal: [x]  arthritis []  joint pain [x]  hx total knee replacement  Integumentary: []  rashes []  ulcers  Constitutional: []  fever []  chills   Physical Examination  Filed Vitals:  01/21/14 1324  BP: 153/55  Pulse: 115  Temp: 97.8 F (36.6 C)  Resp: 21   Body mass index is 33.28 kg/(m^2).  General:  WDWN in NAD Gait: Not observed HENT: WNL, normocephalic Eyes: Pupils equal Pulmonary: normal non-labored breathing, without Rales, rhonchi,  wheezing Cardiac: regular, without  Murmurs, rubs or gallops; without carotid bruits Abdomen: soft, NT/ND, no masses Skin: without rashes, without ulcers  Vascular Exam/Pulses:+ palpable femoral pulses bilaterally; right DP is palpable; pedal pulses are not palpable in the left.  1+ radial pulse on the right; 2 + radial and ulnar pulse on the left. Extremities: without ischemic changes, without Gangrene , with cellulitis; without open wounds; +BLE edema Musculoskeletal: no muscle wasting or atrophy  Neurologic: A&O X 3; Appropriate Affect ; SENSATION: normal; MOTOR FUNCTION:  RUE weakness from polio age 71. Speech is fluent/normal   CBC    Component Value Date/Time   WBC 5.2 01/21/2014 0308   RBC 3.16* 01/21/2014 0308   HGB 8.3* 01/21/2014 0308   HCT 26.3* 01/21/2014 0308   PLT 259 01/21/2014 0308   MCV 83.2 01/21/2014 0308   MCH 26.3 01/21/2014 0308   MCHC 31.6 01/21/2014 0308   RDW 15.5 01/21/2014 0308    BMET    Component Value Date/Time   NA 139 01/18/2014 0535   K 4.0 01/18/2014 0535   CL 104 01/18/2014 0535   CO2 23 01/18/2014 0535   GLUCOSE 111* 01/18/2014 0535   BUN 20 01/18/2014 0535   CREATININE 0.70 01/18/2014 0535   CALCIUM 8.1* 01/18/2014 0535    GFRNONAA 76* 01/18/2014 0535   GFRAA 88* 01/18/2014 0535    COAGS: Lab Results  Component Value Date   INR 1.16 01/17/2014     Non-Invasive Vascular Imaging:   Lower extremity duplex 01/18/14:  Summary:  - Findings consistent with acute deep vein thrombosis involving the right posterior tibial vein. - Findings consistent with acute deep vein thrombosis involving the left posterior tibial vein. Bilateral: Can not rule out DVT in the peroneal vein due to the patient's guarding secondary to the pain. - No evidence of Baker's cyst on the right or left.  CT Scan 01/17/14: IMPRESSION:  1. Bilateral pulmonary thromboemboli emboli involving all of the  lung lobes.  2. CT evidence of right heartstrain (RV/LV Ratio = 1.8) consistent  with at least submassive (intermediate risk) PE. The presence of  right heart strain has been associated with anincreased risk of  morbidity and mortality. Consultation with [place] is recommended.  3. Small focus of pneumonia versus in pulmonary infarction in the  left upper lobe.   Statin:  no Beta Blocker:  no Aspirin:  no ACEI:  yes ARB:  no Other antiplatelets/anticoagulants:  no   ASSESSMENT/PLAN: This is a 80 y.o. female with bilateral pulmonary emboli and bilateral DVT.  She has been on Eliquis since 2/17 with her last dose today.  This is discontinued today due to GIB.  She will be restarted on heparin gtt.  Will schedule for IVC filter Monday morning due to half life of Eliquis as she needs to be off of this for 36 hours.  Leontine Locket, PA-C [facility] [phone]  History and exam details as above.  Recent PE started on Eliquis but then developed slow GI bleed.  Not hemodynamically unstable.  She needs at least 3 half lives to wash out her Eliquis which is non reversible.  This would put her at optimum Sunday  sometime.  If she remains stable would continue heparin for now if unstable or  worsening bleed could place filter on Sunday otherwise will have Dr Early place filter Monday morning.  Continue heparin for now.  Ruta Hinds, MD [facility] [place] Office: [phone] Pager: [phone]

## 2014-01-24 NOTE — Transfer of Care (Signed)
Immediate Anesthesia Transfer of Care Note  Patient: Crystal [name]  Procedure(s) Performed: Procedure(s): INSERTION VENA-CAVA FILTER; ULTRASOUND GUIDED (Right)  Patient Location: PACU  Anesthesia Type:MAC  Level of Consciousness: awake, alert  and oriented  Airway & Oxygen Therapy: Patient Spontanous Breathing and Patient connected to face mask oxygen  Post-op Assessment: Report given to PACU RN  Post vital signs: Reviewed and stable  Complications: No apparent anesthesia complications

## 2014-01-24 NOTE — Anesthesia Preprocedure Evaluation (Signed)
Anesthesia Evaluation  Patient identified by MRN, date of birth, ID band Patient awake    Reviewed: Allergy & Precautions, NPO status , Patient's Chart, lab work & pertinent test results  Airway Mallampati: II      Dental   Pulmonary asthma , former smoker,  breath sounds clear to auscultation        Cardiovascular hypertension, Rhythm:Regular Rate:Normal     Neuro/Psych  Neuromuscular disease    GI/Hepatic GERD-  ,  Endo/Other  Hyperthyroidism   Renal/GU      Musculoskeletal   Abdominal   Peds  Hematology  (+) Blood dyscrasia, anemia ,   Anesthesia Other Findings   Reproductive/Obstetrics                           Anesthesia Physical Anesthesia Plan  ASA: III  Anesthesia Plan: General   Post-op Pain Management:    Induction: Intravenous  Airway Management Planned: LMA and Oral ETT  Additional Equipment:   Intra-op Plan:   Post-operative Plan: Extubation in OR  Informed Consent: I have reviewed the patients History and Physical, chart, labs and discussed the procedure including the risks, benefits and alternatives for the proposed anesthesia with the patient or authorized representative who has indicated his/her understanding and acceptance.     Plan Discussed with: CRNA and Surgeon  Anesthesia Plan Comments:         Anesthesia Quick Evaluation

## 2014-01-25 ENCOUNTER — Encounter (HOSPITAL_COMMUNITY): Payer: Self-pay | Admitting: Vascular Surgery

## 2014-01-25 LAB — PROTIME-INR
INR: 1.16 (ref 0.00–1.49)
PROTHROMBIN TIME: 14.6 s (ref 11.6–15.2)

## 2014-01-25 LAB — CBC
HCT: 25.5 % — ABNORMAL LOW (ref 36.0–46.0)
HEMOGLOBIN: 8.2 g/dL — AB (ref 12.0–15.0)
MCH: 27 pg (ref 26.0–34.0)
MCHC: 32.2 g/dL (ref 30.0–36.0)
MCV: 83.9 fL (ref 78.0–100.0)
Platelets: 215 10*3/uL (ref 150–400)
RBC: 3.04 MIL/uL — ABNORMAL LOW (ref 3.87–5.11)
RDW: 16 % — ABNORMAL HIGH (ref 11.5–15.5)
WBC: 4.7 10*3/uL (ref 4.0–10.5)

## 2014-01-25 LAB — HEPARIN LEVEL (UNFRACTIONATED)
Heparin Unfractionated: 0.22 IU/mL — ABNORMAL LOW (ref 0.30–0.70)
Heparin Unfractionated: 0.2 IU/mL — ABNORMAL LOW (ref 0.30–0.70)

## 2014-01-25 MED ORDER — WARFARIN SODIUM 2.5 MG PO TABS
2.5000 mg | ORAL_TABLET | Freq: Once | ORAL | Status: AC
  Administered 2014-01-25: 2.5 mg via ORAL
  Filled 2014-01-25: qty 1

## 2014-01-25 MED ORDER — GUAIFENESIN ER 600 MG PO TB12
600.0000 mg | ORAL_TABLET | Freq: Two times a day (BID) | ORAL | Status: DC
  Administered 2014-01-25 – 2014-01-27 (×4): 600 mg via ORAL
  Filled 2014-01-25 (×5): qty 1

## 2014-01-25 NOTE — Progress Notes (Signed)
CSW spoke to patient about going to [facility]. Patient states she still wants to go to [facility] at dc, even though her son wants her to come home.  Jeanette Caprice, MSW, [place]

## 2014-01-25 NOTE — Progress Notes (Signed)
Physical Therapy Treatment Patient Details Name: Crystal [name] MRN: [id number] DOB: [birth year] Today's Date: 01/25/2014 Time: 5465-0354 PT Time Calculation (min): 15 min  PT Assessment / Plan / Recommendation  History of Present Illness 80 y.o. female admitted on 01/17/2014 with a past medical history of Hypertension; Arthritis; GERD; Polio; Hyperthyroidism; Peripheral neuropathy; Shingles; IBS; Allergic sinusitis; Asthma; Diverticulosis; Benign tumor of pituitary gland (1999); IBS;  and Uterine cancer who presented with a complaint of dyspnea on exertion and extreme fatigue for 1 wk now. She also c/o chest tightness on admission. She was found to have extensive PEs per CT scan   PT Comments   Pt able to maintain spO2 90% on room air during gait.  Continues to require min A for balance, needs frequent rest breaks due to decreased activity tolerance.  Follow Up Recommendations  SNF     Does the patient have the potential to tolerate intense rehabilitation     Barriers to Discharge        Equipment Recommendations  None recommended by PT    Recommendations for Other Services    Frequency Min 3X/week   Progress towards PT Goals Progress towards PT goals: Progressing toward goals  Plan Current plan remains appropriate    Precautions / Restrictions Precautions Precautions: Fall Restrictions Weight Bearing Restrictions: No   Pertinent Vitals/Pain No c/o pain    Mobility  Bed Mobility Supine to sit: Supervision General bed mobility comments: uses rail Transfers Equipment used: None Sit to Stand: Min assist General transfer comment: steadying assist, increased time Ambulation/Gait Ambulation/Gait assistance: Min assist Ambulation Distance (Feet): 100 Feet Assistive device: Straight cane Gait velocity interpretation: Below normal speed for age/gender General Gait Details: pt with severe pronation B feet, unsteady gait, wide BOS, requires prolonged standing rest break  halfway through gait training. pt states she is "very tired" after gait, unwilling to perform therex    Exercises     PT Diagnosis:    PT Problem List:   PT Treatment Interventions:     PT Goals (current goals can now be found in the care plan section)    Visit Information  Last PT Received On: 01/25/14 Assistance Needed: +1 History of Present Illness: 80 y.o. female admitted on 01/17/2014 with a past medical history of Hypertension; Arthritis; GERD; Polio; Hyperthyroidism; Peripheral neuropathy; Shingles; IBS; Allergic sinusitis; Asthma; Diverticulosis; Benign tumor of pituitary gland (1999); IBS;  and Uterine cancer who presented with a complaint of dyspnea on exertion and extreme fatigue for 1 wk now. She also c/o chest tightness on admission. She was found to have extensive PEs per CT scan    Subjective Data      Cognition  Cognition Arousal/Alertness: Awake/alert Behavior During Therapy: WFL for tasks assessed/performed Overall Cognitive Status: Within Functional Limits for tasks assessed    Balance     End of Session PT - End of Session Equipment Utilized During Treatment: Gait belt Activity Tolerance: Patient limited by fatigue Patient left: in chair;with call bell/phone within reach Nurse Communication: Mobility status   GP     Crystal [name] 01/25/2014, 10:43 AM

## 2014-01-25 NOTE — Progress Notes (Addendum)
[facility] PROGRESS NOTE  Crystal [name] GOT:[id number] DOB: [birth year] DOA: 01/17/2014 PCP: Mathews Argyle, MD  Assessment/Plan: 80 y.o. female admitted on 01/17/2014 with a past medical history of Hypertension; Arthritis; GERD; Polio; Hyperthyroidism; Peripheral neuropathy; Shingles; IBS; Allergic sinusitis; Asthma; Diverticulosis; Benign tumor of pituitary gland (1999); IBS; and Uterine cancer who presented with a complaint of dyspnea on exertion and extreme fatigue for 1 wk now. She also c/o chest tightness on admission. She was found to have extensive PEs per CT scan -started on [facility] but developed GIB  1. Bilateral pulmonary emboli; patient is evaluated by Pulm recommended  lifelong AC tx  -echo: LVEF 65%, reduced RV function; pulmonary HTN; no s/s of fluids overload; monitor off diuretics  -she was on IV heparin --> transitioned to Eliquis 2/17 -2/20: + melena, +occult blood; -->d/c apixaban, started IV heparin due to significant PE;  -s/p EGD no acute bleeding  -2/23: s/p IVC; but needs anticoagulation for significant PE DOE, R heart strain; started coumadin+heparin to be able to reverse if re bleeds; cont monitor   2. B LE Posterior Tibial V DVT anticoag as noted above  -s/p IVC 2/23; appreciate vascular surgery evaluation for IVC filter   3. Acute blood loss anemia on anticoagulation with GIB; d/c apixaban 2/20;  -Tf sed 1 unit 2/20; monitor Hg, TF prn; no new episodes of bleeding  -s/p EGD: gastric polys no obvious source for bleeding; appreciate GI evaluation    3. Hypotension with h/o HTN hold antihypertensives  4. COPD No longer smoking , reports cough, cont bronchodilators, added advair, was levofloxacin (d/c on 2/24)   5. GERD/ Diverticulosis  , may need colonoscopy if re bleeds  6. Hypothyroid cont Synthroid   7. History of polio with right upper extremity weakness -PT recommended SNF   D/w patient , her son extensively about her medical condition,  management, prognosis, VTE, anticoagulation, bleeding, complications;  -  if rebleeds significantly may need to reconsider anticoagulation, and palliative care  -  Prognosis guarded   Code Status: full Family Communication: d/w patient, updated [name] [phone]  (indicate person spoken with, relationship, and if by phone, the number) Disposition Plan: SNF 2-3 days    Consultants:  Pulmonary   Procedures:  Non e  Antibiotics:  Levofloxacin 2/19 (indicate start date, and stop date if known)  HPI/Subjective: alert  Objective: Filed Vitals:   01/25/14 0515  BP: 142/77  Pulse: 90  Temp: 97.9 F (36.6 C)  Resp: 18    Intake/Output Summary (Last 24 hours) at 01/25/14 3646 Last data filed at 01/25/14 0600  Gross per 24 hour  Intake    459 ml  Output      0 ml  Net    459 ml   Filed Weights   01/18/14 0155  Weight: 88 kg (194 lb 0.1 oz)    Exam:   General:  alert  Cardiovascular: s1,s2 rrr  Respiratory: CTA BL  Abdomen: soft, nt, nd   Musculoskeletal: no LE edema   Data Reviewed: Basic Metabolic Panel:  Recent Labs Lab 01/19/14 0236 01/24/14 1045  NA  --  135*  K  --  3.9  CL  --  98  CO2  --  28  GLUCOSE  --  86  BUN  --  9  CREATININE  --  0.65  CALCIUM  --  8.4  MG 1.7  --    Liver Function Tests: No results found for this basename: AST, ALT, ALKPHOS, BILITOT,  PROT, ALBUMIN,  in the last 168 hours No results found for this basename: LIPASE, AMYLASE,  in the last 168 hours No results found for this basename: AMMONIA,  in the last 168 hours CBC:  Recent Labs Lab 01/21/14 0308  01/22/14 0345  01/22/14 1510 01/23/14 0051 01/24/14 0400 01/24/14 1045 01/25/14 0326  WBC 5.2  --  4.4  --   --   --  4.3 4.3 4.7  HGB 8.3*  < > 8.9*  < > 8.9* 9.0* 8.1* 8.3* 8.2*  HCT 26.3*  < > 27.8*  < > 27.8* 27.6* 25.3* 26.2* 25.5*  MCV 83.2  --  83.0  --   --   --  83.2 83.7 83.9  PLT 259  --  230  --   --   --  217 239 215  < > = values  in this interval not displayed. Cardiac Enzymes:  Recent Labs Lab 01/18/14 1130  TROPONINI 0.37*   BNP (last 3 results)  Recent Labs  01/17/14 2002  PROBNP 604.6*   CBG: No results found for this basename: GLUCAP,  in the last 168 hours  Recent Results (from the past 240 hour(s))  MRSA PCR SCREENING     Status: Abnormal   Collection Time    01/18/14  1:50 AM      Result Value Ref Range Status   MRSA by PCR POSITIVE (*) NEGATIVE Final   Comment:            The GeneXpert MRSA Assay (FDA     approved for NASAL specimens     only), is one component of a     comprehensive MRSA colonization     surveillance program. It is not     intended to diagnose MRSA     infection nor to guide or     monitor treatment for     MRSA infections.     RESULT CALLED TO, READ BACK BY AND VERIFIED WITHElmarie Mainland RN [id number] 01/18/14 MITCHELL,L     Studies: Dg Abd 1 View - Kub  01/24/2014   CLINICAL DATA:  IVC filter check.  EXAM: ABDOMEN - 1 VIEW  COMPARISON:  None.  FINDINGS: IVC filter is in place extending from L1-2 to the L3-4 level. There is a large volume of stool present in the colon. No evidence of small bowel obstruction is identified. Postoperative change lower lumbar spine is noted.  IMPRESSION: IVC filter is in place.  No acute abnormality.   Electronically Signed   By: Inge Rise M.D.   On: 01/24/2014 10:22    Scheduled Meds: . [START ON 01/30/2014] cabergoline  0.25 mg Oral Q14 Days  . calcium carbonate  1 tablet Oral BID WC  . docusate sodium  100 mg Oral BID  . gabapentin  400 mg Oral Daily  . gabapentin  800 mg Oral QHS  . guaiFENesin  600 mg Oral Q6H  . levofloxacin  500 mg Oral Daily  . levothyroxine  100 mcg Oral QAC breakfast  . mirtazapine  60 mg Oral QHS  . mometasone-formoterol  2 puff Inhalation BID  . pantoprazole  40 mg Oral Daily  . pramipexole  0.75 mg Oral QHS  . warfarin   Does not apply Once  . Warfarin - Pharmacist Dosing Inpatient   Does not apply  q1800   Continuous Infusions: . heparin 900 Units/hr (01/24/14 1417)    Principal Problem:   Bilateral pulmonary embolism Active Problems:  PE (pulmonary embolism)   DVT (deep venous thrombosis)   Hypotension, unspecified    Time spent: >35 minute s    Kinnie Feil  [facility] Pager [phone]. If 7PM-7AM, please contact night-coverage at [email or web address], password [facility] 01/25/2014, 8:32 AM  LOS: 8 days

## 2014-01-25 NOTE — Progress Notes (Signed)
ANTICOAGULATION CONSULT NOTE - Follow Up Consult  Pharmacy Consult for Heparin Indication: PE/DVT  Allergies  Allergen Reactions  . Clinoril [Sulindac] Rash  . Erythromycin Rash  . Penicillins Rash  . Prozac [Fluoxetine Hcl] Rash  . Sulfa Antibiotics Rash    Patient Measurements: Height: 5\' 4"  (162.6 cm) Weight: 194 lb 0.1 oz (88 kg) IBW/kg (Calculated) : 54.7  Vital Signs: Temp: 97.9 F (36.6 C) (02/24 0515) Temp src: Oral (02/24 0515) BP: 142/77 mmHg (02/24 0515) Pulse Rate: 90 (02/24 0515)  Labs:  Recent Labs  01/23/14 2007 01/24/14 0400 01/24/14 1045 01/24/14 1445 01/24/14 2146 01/25/14 0326 01/25/14 0900  HGB  --  8.1* 8.3*  --   --  8.2*  --   HCT  --  25.3* 26.2*  --   --  25.5*  --   PLT  --  217 239  --   --  215  --   APTT 94* 109*  --   --  70*  --   --   LABPROT  --   --   --  14.3  --  14.6  --   INR  --   --   --  1.13  --  1.16  --   HEPARINUNFRC  --  0.58  --   --  0.33  --  0.22*  CREATININE  --   --  0.65  --   --   --   --     Estimated Creatinine Clearance: 54.2 ml/min (by C-G formula based on Cr of 0.65).  Medications:  Heparin 900 units/hr  Assessment: 80 y/o F on heparin with warfarin bridge for PE/DVT.  Pt has significant clot burden with right heart strain.  We were initially dosing heparin based on aPTT due to history of apixaban, but as of last night, both the HL and aPTT were therapeutic at the lower end of the range, now we will be dosing solely based on HL. HL this morning is subtherapeutic.  INR remains low at 1.16 as expected.  No bleeding is noted, hemoglobin remains low but stable at 8.2, platelets are wnl.    Goal of Therapy:  Heparin level 0.3-0.7 units/ml aPTT 66-102 seconds Monitor platelets by anticoagulation protocol: Yes   Plan:  -Increase heparin gtt to 950 units/hr -Warfarin 2.5 mg po x1 -Daily HL, CBC, INR -Next HL at [place], PharmD, BCPS Clinical Pharmacist Pager: [phone] 01/25/2014 11:31  AM

## 2014-01-25 NOTE — Progress Notes (Signed)
ANTICOAGULATION CONSULT NOTE - Follow Up Consult  Pharmacy Consult for Heparin Indication: PE/DVT  Allergies  Allergen Reactions  . Clinoril [Sulindac] Rash  . Erythromycin Rash  . Penicillins Rash  . Prozac [Fluoxetine Hcl] Rash  . Sulfa Antibiotics Rash    Patient Measurements: Height: 5\' 4"  (162.6 cm) Weight: 194 lb 0.1 oz (88 kg) IBW/kg (Calculated) : 54.7 Heparin Dosing Weight: 74.3kg  Vital Signs: Temp: 98.3 F (36.8 C) (02/24 1955) Temp src: Oral (02/24 1955) BP: 116/71 mmHg (02/24 1955) Pulse Rate: 73 (02/24 2000)  Labs:  Recent Labs  01/23/14 2007 01/24/14 0400 01/24/14 1045 01/24/14 1445 01/24/14 2146 01/25/14 0326 01/25/14 0900 01/25/14 2034  HGB  --  8.1* 8.3*  --   --  8.2*  --   --   HCT  --  25.3* 26.2*  --   --  25.5*  --   --   PLT  --  217 239  --   --  215  --   --   APTT 94* 109*  --   --  70*  --   --   --   LABPROT  --   --   --  14.3  --  14.6  --   --   INR  --   --   --  1.13  --  1.16  --   --   HEPARINUNFRC  --  0.58  --   --  0.33  --  0.22* 0.20*  CREATININE  --   --  0.65  --   --   --   --   --     Estimated Creatinine Clearance: 54.2 ml/min (by C-G formula based on Cr of 0.65).   Medications:  Heparin 950 units/hr  Assessment: 88yof on heparin bridging to Coumadin for PE/DVT. Heparin level (0.2) is subtherapeutic despite rate increase. Patient is being monitored for possible GI bleed - no additional bleeding reported.  - H/H and Plts stable  Goal of Therapy:  Heparin level 0.3-0.7 units/ml Monitor platelets by anticoagulation protocol: Yes   Plan:  1. Increase heparin drip to 1100 units/hr (11 ml/hr) 2. Check heparin level 8 hours after rate increase 3. Monitor for s/sx of bleeding  Earleen Newport [phone] 01/25/2014,9:25 PM

## 2014-01-26 LAB — CBC
HCT: 25.1 % — ABNORMAL LOW (ref 36.0–46.0)
Hemoglobin: 8 g/dL — ABNORMAL LOW (ref 12.0–15.0)
MCH: 26.5 pg (ref 26.0–34.0)
MCHC: 31.9 g/dL (ref 30.0–36.0)
MCV: 83.1 fL (ref 78.0–100.0)
Platelets: 238 10*3/uL (ref 150–400)
RBC: 3.02 MIL/uL — ABNORMAL LOW (ref 3.87–5.11)
RDW: 16 % — AB (ref 11.5–15.5)
WBC: 5 10*3/uL (ref 4.0–10.5)

## 2014-01-26 LAB — HEPARIN LEVEL (UNFRACTIONATED)
HEPARIN UNFRACTIONATED: 0.35 [IU]/mL (ref 0.30–0.70)
Heparin Unfractionated: 0.59 IU/mL (ref 0.30–0.70)

## 2014-01-26 LAB — PROTIME-INR
INR: 1.23 (ref 0.00–1.49)
Prothrombin Time: 15.2 seconds (ref 11.6–15.2)

## 2014-01-26 MED ORDER — WARFARIN SODIUM 4 MG PO TABS
4.0000 mg | ORAL_TABLET | Freq: Once | ORAL | Status: AC
  Administered 2014-01-26: 4 mg via ORAL
  Filled 2014-01-26: qty 1

## 2014-01-26 NOTE — Progress Notes (Signed)
[facility] PROGRESS NOTE  Crystal [name] NWG:[id number] DOB: 1925-10-14 DOA: 01/17/2014 PCP: Mathews Argyle, MD  Assessment/Plan: 80 y.o. female admitted on 01/17/2014 with a past medical history of Hypertension; Arthritis; GERD; Polio; Hyperthyroidism; Peripheral neuropathy; Shingles; IBS; Allergic sinusitis; Asthma; Diverticulosis; Benign tumor of pituitary gland (1999); IBS; and Uterine cancer who presented with a complaint of dyspnea on exertion and extreme fatigue for 1 wk now. She also c/o chest tightness on admission. She was found to have extensive PEs per CT scan -started on [facility] but developed GIB, s/p EGD, s/p IVC filter, now restarted on anticoagulation  1. Bilateral pulmonary emboli; patient is evaluated by Pulm recommended  lifelong AC tx  -echo: LVEF 65%, reduced RV function; pulmonary HTN; no s/s of fluids overload; monitor off diuretics  -she was on IV heparin --> transitioned to Eliquis 2/17 -2/20: + melena, +occult blood; -->d/c apixaban, started IV heparin due to significant PE;  -s/p EGD no acute bleeding  -2/23: s/p IVC;  - restarted on anticoagulation due to significant PE DOE, R heart strain; started coumadin yesterday, with heparin bridge  2. B LE Posterior Tibial V DVT anticoag as noted above  -s/p IVC 2/23; appreciate vascular surgery evaluation, s/p IVC filter   3. Acute blood loss anemia on anticoagulation with GIB; d/c apixaban 2/20;  -Tf sed 1 unit 2/20; monitor Hg, TF prn; no new episodes of bleeding  -s/p EGD: gastric polyps no obvious source for bleeding; appreciate GI evaluation    3. Hypotension with h/o HTN hold antihypertensives   4. COPD No longer smoking , reports cough, cont bronchodilators, added advair, was levofloxacin (d/c on 2/24)    5. GERD/ Diverticulosis  , may need colonoscopy if re bleeds   6. Hypothyroid cont Synthroid    7. History of polio with right upper extremity weakness -PT recommended SNF   D/w patient ,  -   if rebleeds significantly may need to reconsider anticoagulation, and palliative care  -  Prognosis guarded   Code Status: full Family Communication: d/w patient, updated [name] [phone]  (indicate person spoken with, relationship, and if by phone, the number) Disposition Plan: SNF 2-3 days    Consultants:  Pulmonary   Procedures:  Non e  Antibiotics:  Levofloxacin 2/19 (indicate start date, and stop date if known)  HPI/Subjective: Denies any complaints  Objective: Filed Vitals:   01/26/14 0408  BP: 116/48  Pulse: 70  Temp: 97.2 F (36.2 C)  Resp: 17    Intake/Output Summary (Last 24 hours) at 01/26/14 1405 Last data filed at 01/25/14 1740  Gross per 24 hour  Intake    600 ml  Output      0 ml  Net    600 ml   Filed Weights   01/18/14 0155  Weight: 88 kg (194 lb 0.1 oz)    Exam:   General:  alert  Cardiovascular: s1,s2 rrr  Respiratory: CTA BL  Abdomen: soft, nt, nd   Musculoskeletal: no LE edema   Data Reviewed: Basic Metabolic Panel:  Recent Labs Lab 01/24/14 1045  NA 135*  K 3.9  CL 98  CO2 28  GLUCOSE 86  BUN 9  CREATININE 0.65  CALCIUM 8.4   Liver Function Tests: No results found for this basename: AST, ALT, ALKPHOS, BILITOT, PROT, ALBUMIN,  in the last 168 hours No results found for this basename: LIPASE, AMYLASE,  in the last 168 hours No results found for this basename: AMMONIA,  in the last 168  hours CBC:  Recent Labs Lab 01/22/14 0345  01/23/14 0051 01/24/14 0400 01/24/14 1045 01/25/14 0326 01/26/14 0245  WBC 4.4  --   --  4.3 4.3 4.7 5.0  HGB 8.9*  < > 9.0* 8.1* 8.3* 8.2* 8.0*  HCT 27.8*  < > 27.6* 25.3* 26.2* 25.5* 25.1*  MCV 83.0  --   --  83.2 83.7 83.9 83.1  PLT 230  --   --  217 239 215 238  < > = values in this interval not displayed. Cardiac Enzymes: No results found for this basename: CKTOTAL, CKMB, CKMBINDEX, TROPONINI,  in the last 168 hours BNP (last 3 results)  Recent Labs   01/17/14 2002  PROBNP 604.6*   CBG: No results found for this basename: GLUCAP,  in the last 168 hours  Recent Results (from the past 240 hour(s))  MRSA PCR SCREENING     Status: Abnormal   Collection Time    01/18/14  1:50 AM      Result Value Ref Range Status   MRSA by PCR POSITIVE (*) NEGATIVE Final   Comment:            The GeneXpert MRSA Assay (FDA     approved for NASAL specimens     only), is one component of a     comprehensive MRSA colonization     surveillance program. It is not     intended to diagnose MRSA     infection nor to guide or     monitor treatment for     MRSA infections.     RESULT CALLED TO, READ BACK BY AND VERIFIED WITHElmarie Mainland RN [id number] 01/18/14 MITCHELL,L     Studies: No results found.  Scheduled Meds: . [START ON 01/30/2014] cabergoline  0.25 mg Oral Q14 Days  . calcium carbonate  1 tablet Oral BID WC  . docusate sodium  100 mg Oral BID  . gabapentin  400 mg Oral Daily  . gabapentin  800 mg Oral QHS  . guaiFENesin  600 mg Oral BID  . levothyroxine  100 mcg Oral QAC breakfast  . mirtazapine  60 mg Oral QHS  . mometasone-formoterol  2 puff Inhalation BID  . pantoprazole  40 mg Oral Daily  . pramipexole  0.75 mg Oral QHS  . warfarin  4 mg Oral ONCE-1800  . Warfarin - Pharmacist Dosing Inpatient   Does not apply q1800   Continuous Infusions: . heparin 1,100 Units/hr (01/25/14 2151)    Principal Problem:   Bilateral pulmonary embolism Active Problems:   PE (pulmonary embolism)   DVT (deep venous thrombosis)   Hypotension, unspecified    Time spent: >35 minute s    [facility] Pager [phone]. If 7PM-7AM, please contact night-coverage at [email or web address], password [facility] 01/26/2014, 2:05 PM  LOS: 9 days

## 2014-01-26 NOTE — Progress Notes (Addendum)
ANTICOAGULATION CONSULT NOTE - Follow Up Consult  Pharmacy Consult for Heparin Indication: PE/DVT  Allergies  Allergen Reactions  . Clinoril [Sulindac] Rash  . Erythromycin Rash  . Penicillins Rash  . Prozac [Fluoxetine Hcl] Rash  . Sulfa Antibiotics Rash    Patient Measurements: Height: 5\' 4"  (162.6 cm) Weight: 194 lb 0.1 oz (88 kg) IBW/kg (Calculated) : 54.7  Vital Signs: Temp: 97.2 F (36.2 C) (02/25 0408) Temp src: Oral (02/25 0408) BP: 116/48 mmHg (02/25 0408) Pulse Rate: 70 (02/25 0408)  Labs:  Recent Labs  01/23/14 2007  01/24/14 0400 01/24/14 1045 01/24/14 1445 01/24/14 2146 01/25/14 0326 01/25/14 0900 01/25/14 2034 01/26/14 0245  HGB  --   < > 8.1* 8.3*  --   --  8.2*  --   --  8.0*  HCT  --   < > 25.3* 26.2*  --   --  25.5*  --   --  25.1*  PLT  --   < > 217 239  --   --  215  --   --  238  APTT 94*  --  109*  --   --  70*  --   --   --   --   LABPROT  --   --   --   --  14.3  --  14.6  --   --  15.2  INR  --   --   --   --  1.13  --  1.16  --   --  1.23  HEPARINUNFRC  --   --  0.58  --   --  0.33  --  0.22* 0.20* 0.35  CREATININE  --   --   --  0.65  --   --   --   --   --   --   < > = values in this interval not displayed.  Estimated Creatinine Clearance: 54.2 ml/min (by C-G formula based on Cr of 0.65).  Medications:  Heparin 900 units/hr  Assessment: 80 y/o F on heparin and warfarin for PE/DVT.  Pt has significant clot burden with right heart strain and was being worked up this admission for suspected GI bleed.  HL this morning is therapeutic.  INR remains low at 1.23 as expected after two doses of warfarin.  Hemoglobin remains low but stable at 8, platelets are wnl.    Goal of Therapy:  Heparin level 0.3-0.7 units/ml INR: 2-3 Monitor platelets by anticoagulation protocol: Yes   Plan:  -Continue heparin at 1100 units/hr -Warfarin 4 mg po x1 -Daily HL, CBC, INR -Next HL at 1100 -> checking confirmatory level as pt is only therapeutic x1  and was previously supratherapeutic at this rate  Hughes Better, PharmD, BCPS Clinical Pharmacist Pager: [phone] 01/26/2014 8:18 AM   Update: Pt's most recent HL from 1100 today is therapeutic, will continue with heparin at 1100 units/hr and check HL again tomorrow morning.  Hughes Better

## 2014-01-27 LAB — CBC
HCT: 26.9 % — ABNORMAL LOW (ref 36.0–46.0)
HEMOGLOBIN: 8.4 g/dL — AB (ref 12.0–15.0)
MCH: 26.1 pg (ref 26.0–34.0)
MCHC: 31.2 g/dL (ref 30.0–36.0)
MCV: 83.5 fL (ref 78.0–100.0)
Platelets: 273 10*3/uL (ref 150–400)
RBC: 3.22 MIL/uL — ABNORMAL LOW (ref 3.87–5.11)
RDW: 15.8 % — ABNORMAL HIGH (ref 11.5–15.5)
WBC: 5.3 10*3/uL (ref 4.0–10.5)

## 2014-01-27 LAB — PROTIME-INR
INR: 1.09 (ref 0.00–1.49)
PROTHROMBIN TIME: 13.9 s (ref 11.6–15.2)

## 2014-01-27 LAB — HEPARIN LEVEL (UNFRACTIONATED)
Heparin Unfractionated: 0.28 IU/mL — ABNORMAL LOW (ref 0.30–0.70)
Heparin Unfractionated: 0.41 IU/mL (ref 0.30–0.70)

## 2014-01-27 MED ORDER — GUAIFENESIN ER 600 MG PO TB12
1200.0000 mg | ORAL_TABLET | Freq: Two times a day (BID) | ORAL | Status: DC
  Administered 2014-01-27 – 2014-02-01 (×10): 1200 mg via ORAL
  Filled 2014-01-27 (×11): qty 2

## 2014-01-27 MED ORDER — WARFARIN SODIUM 5 MG PO TABS
5.0000 mg | ORAL_TABLET | Freq: Once | ORAL | Status: AC
  Administered 2014-01-27: 5 mg via ORAL
  Filled 2014-01-27 (×2): qty 1

## 2014-01-27 MED ORDER — AMLODIPINE BESYLATE 5 MG PO TABS
5.0000 mg | ORAL_TABLET | Freq: Every day | ORAL | Status: DC
  Administered 2014-01-28 – 2014-01-31 (×4): 5 mg via ORAL
  Filled 2014-01-27 (×5): qty 1

## 2014-01-27 NOTE — Progress Notes (Signed)
Pt given PO Tums for indigestion per pt request at this time; will cont. To monitor.

## 2014-01-27 NOTE — Progress Notes (Signed)
01/27/14  Pharmacy-  Heparin 2105  Heparin level = 0.41 (goal 0.3-0.7)  A/P:  80yo female with (+)PE.  Heparin level is therapeutic on current rate.  No bleeding issues noted.  1-  Continue current rate 2-  F/U in AM  Gracy Bruins, PharmD [facility]

## 2014-01-27 NOTE — Progress Notes (Signed)
ANTICOAGULATION CONSULT NOTE - Follow Up Consult  Pharmacy Consult for Heparin Indication: PE/DVT  Allergies  Allergen Reactions  . Clinoril [Sulindac] Rash  . Erythromycin Rash  . Penicillins Rash  . Prozac [Fluoxetine Hcl] Rash  . Sulfa Antibiotics Rash    Patient Measurements: Height: 5\' 4"  (162.6 cm) Weight: 194 lb 0.1 oz (88 kg) IBW/kg (Calculated) : 54.7 Heparin Dosing Weight: 74.3kg  Vital Signs: Temp: 97.7 F (36.5 C) (02/26 0304) Temp src: Oral (02/26 0304) BP: 136/80 mmHg (02/26 0304) Pulse Rate: 73 (02/26 0304)  Labs:  Recent Labs  01/24/14 1045  01/24/14 2146 01/25/14 0326  01/26/14 0245 01/26/14 1120 01/27/14 0303  HGB 8.3*  --   --  8.2*  --  8.0*  --  8.4*  HCT 26.2*  --   --  25.5*  --  25.1*  --  26.9*  PLT 239  --   --  215  --  238  --  273  APTT  --   --  70*  --   --   --   --   --   LABPROT  --   < >  --  14.6  --  15.2  --  13.9  INR  --   < >  --  1.16  --  1.23  --  1.09  HEPARINUNFRC  --   --  0.33  --   < > 0.35 0.59 0.28*  CREATININE 0.65  --   --   --   --   --   --   --   < > = values in this interval not displayed.  Estimated Creatinine Clearance: 54.2 ml/min (by C-G formula based on Cr of 0.65).  Medications:  Heparin 900 units/hr  Assessment: 80 y/o F on heparin and warfarin for PE/DVT.  Pt has significant clot burden with right heart strain and was being worked up this admission for suspected GI bleed.  HL this morning is subtherapeutic.  INR remains low at 1.09-will increase warfarin dose tonight.  Hemoglobin low but stable at 8.4, platelets are wnl.    Goal of Therapy:  Heparin level 0.3-0.7 units/ml INR: 2-3 Monitor platelets by anticoagulation protocol: Yes   Plan:  -Increase heparin to 1200 units/hr -Warfarin 5 mg po x1 -Daily HL, CBC, INR -Next HL at [place], PharmD, BCPS Clinical Pharmacist Pager: [phone] 01/27/2014 8:54 AM

## 2014-01-27 NOTE — Progress Notes (Signed)
[facility] PROGRESS NOTE  Crystal [name] QIH:[id number] DOB: Jul 23, 1925 DOA: 01/17/2014 PCP: Mathews Argyle, MD  Assessment/Plan: 80 y.o. female admitted on 01/17/2014 with a past medical history of Hypertension; Arthritis; GERD; Polio; Hyperthyroidism; Peripheral neuropathy; Shingles; IBS; Allergic sinusitis; Asthma; Diverticulosis; Benign tumor of pituitary gland (1999); IBS; and Uterine cancer who presented with a complaint of dyspnea on exertion and extreme fatigue for 1 wk now. She also c/o chest tightness on admission. She was found to have extensive PEs per CT scan -started on [facility] but developed GIB, s/p EGD, s/p IVC filter, now restarted on anticoagulation  1. Bilateral pulmonary emboli; patient is evaluated by Pulm recommended  lifelong AC tx  -echo: LVEF 65%, reduced RV function; pulmonary HTN; no s/s of fluids overload; monitor off diuretics  -she was on IV heparin --> transitioned to Eliquis 2/17 -2/20: + melena, +occult blood; -->d/c apixaban, started IV heparin due to significant PE;  -s/p EGD no acute bleeding  -2/23: s/p IVC;  - restarted on anticoagulation due to significant PE DOE, R heart strain; started coumadin yesterday, with heparin bridge  2. B LE Posterior Tibial V DVT anticoag as noted above  -s/p IVC 2/23; appreciate vascular surgery evaluation, s/p IVC filter   3. Acute blood loss anemia on anticoagulation with GIB; d/c apixaban 2/20;  -Tf sed 1 unit 2/20; monitor Hg, TF prn; no new episodes of bleeding  -s/p EGD: gastric polyps no obvious source for bleeding    3. Hypotension with h/o HTN  -resolved -resume Amlodipine  4. COPD No longer smoking , reports cough, cont bronchodilators, added advair, was levofloxacin (d/c on 2/24)    5. GERD/ Diverticulosis -may need colonoscopy if re bleeds   6. Hypothyroid  -cont Synthroid    7. History of polio with right upper extremity weakness -PT recommended SNF   D/w patient ,  -  if rebleeds  significantly may need to reconsider anticoagulation, and palliative care  -  Prognosis guarded   Code Status: full Family Communication: d/w patient, updated [name] [phone]  (indicate person spoken with, relationship, and if by phone, the number) Disposition Plan: SNF 2-3 days    Consultants:  Pulmonary   Procedures:  Non e  Antibiotics:  Levofloxacin 2/19 (indicate start date, and stop date if known)  HPI/Subjective: Denies any complaints  Objective: Filed Vitals:   01/27/14 1408  BP: 136/52  Pulse: 96  Temp: 98.4 F (36.9 C)  Resp: 18    Intake/Output Summary (Last 24 hours) at 01/27/14 1508 Last data filed at 01/27/14 1230  Gross per 24 hour  Intake    840 ml  Output    850 ml  Net    -10 ml   Filed Weights   01/18/14 0155  Weight: 88 kg (194 lb 0.1 oz)    Exam:   General:  alert  Cardiovascular: s1,s2 rrr  Respiratory: CTA BL  Abdomen: soft, nt, nd   Musculoskeletal: no LE edema   Data Reviewed: Basic Metabolic Panel:  Recent Labs Lab 01/24/14 1045  NA 135*  K 3.9  CL 98  CO2 28  GLUCOSE 86  BUN 9  CREATININE 0.65  CALCIUM 8.4   Liver Function Tests: No results found for this basename: AST, ALT, ALKPHOS, BILITOT, PROT, ALBUMIN,  in the last 168 hours No results found for this basename: LIPASE, AMYLASE,  in the last 168 hours No results found for this basename: AMMONIA,  in the last 168 hours CBC:  Recent Labs  Lab 01/24/14 0400 01/24/14 1045 01/25/14 0326 01/26/14 0245 01/27/14 0303  WBC 4.3 4.3 4.7 5.0 5.3  HGB 8.1* 8.3* 8.2* 8.0* 8.4*  HCT 25.3* 26.2* 25.5* 25.1* 26.9*  MCV 83.2 83.7 83.9 83.1 83.5  PLT 217 239 215 238 273   Cardiac Enzymes: No results found for this basename: CKTOTAL, CKMB, CKMBINDEX, TROPONINI,  in the last 168 hours BNP (last 3 results)  Recent Labs  01/17/14 2002  PROBNP 604.6*   CBG: No results found for this basename: GLUCAP,  in the last 168 hours  Recent Results (from  the past 240 hour(s))  MRSA PCR SCREENING     Status: Abnormal   Collection Time    01/18/14  1:50 AM      Result Value Ref Range Status   MRSA by PCR POSITIVE (*) NEGATIVE Final   Comment:            The GeneXpert MRSA Assay (FDA     approved for NASAL specimens     only), is one component of a     comprehensive MRSA colonization     surveillance program. It is not     intended to diagnose MRSA     infection nor to guide or     monitor treatment for     MRSA infections.     RESULT CALLED TO, READ BACK BY AND VERIFIED WITHElmarie Mainland RN [id number] 01/18/14 MITCHELL,L     Studies: No results found.  Scheduled Meds: . [START ON 01/30/2014] cabergoline  0.25 mg Oral Q14 Days  . calcium carbonate  1 tablet Oral BID WC  . docusate sodium  100 mg Oral BID  . gabapentin  400 mg Oral Daily  . gabapentin  800 mg Oral QHS  . guaiFENesin  1,200 mg Oral BID  . levothyroxine  100 mcg Oral QAC breakfast  . mirtazapine  60 mg Oral QHS  . mometasone-formoterol  2 puff Inhalation BID  . pantoprazole  40 mg Oral Daily  . pramipexole  0.75 mg Oral QHS  . warfarin  5 mg Oral ONCE-1800  . Warfarin - Pharmacist Dosing Inpatient   Does not apply q1800   Continuous Infusions: . heparin 1,200 Units/hr (01/27/14 0858)    Principal Problem:   Bilateral pulmonary embolism Active Problems:   PE (pulmonary embolism)   DVT (deep venous thrombosis)   Hypotension, unspecified    Time spent: >35 minute s    [facility] Pager [phone]. If 7PM-7AM, please contact night-coverage at [email or web address], password [facility] 01/27/2014, 3:08 PM  LOS: 10 days

## 2014-01-28 DIAGNOSIS — I2699 Other pulmonary embolism without acute cor pulmonale: Secondary | ICD-10-CM

## 2014-01-28 DIAGNOSIS — I517 Cardiomegaly: Secondary | ICD-10-CM

## 2014-01-28 DIAGNOSIS — R7989 Other specified abnormal findings of blood chemistry: Secondary | ICD-10-CM

## 2014-01-28 DIAGNOSIS — I959 Hypotension, unspecified: Secondary | ICD-10-CM

## 2014-01-28 DIAGNOSIS — I82409 Acute embolism and thrombosis of unspecified deep veins of unspecified lower extremity: Secondary | ICD-10-CM

## 2014-01-28 LAB — CBC
HCT: 25.1 % — ABNORMAL LOW (ref 36.0–46.0)
Hemoglobin: 8 g/dL — ABNORMAL LOW (ref 12.0–15.0)
MCH: 26.7 pg (ref 26.0–34.0)
MCHC: 31.9 g/dL (ref 30.0–36.0)
MCV: 83.7 fL (ref 78.0–100.0)
PLATELETS: 256 10*3/uL (ref 150–400)
RBC: 3 MIL/uL — ABNORMAL LOW (ref 3.87–5.11)
RDW: 15.8 % — ABNORMAL HIGH (ref 11.5–15.5)
WBC: 5 10*3/uL (ref 4.0–10.5)

## 2014-01-28 LAB — PROTIME-INR
INR: 1.17 (ref 0.00–1.49)
PROTHROMBIN TIME: 14.7 s (ref 11.6–15.2)

## 2014-01-28 LAB — HEPARIN LEVEL (UNFRACTIONATED)
Heparin Unfractionated: 0.26 IU/mL — ABNORMAL LOW (ref 0.30–0.70)
Heparin Unfractionated: 0.45 IU/mL (ref 0.30–0.70)
Heparin Unfractionated: 0.77 IU/mL — ABNORMAL HIGH (ref 0.30–0.70)

## 2014-01-28 MED ORDER — WARFARIN SODIUM 7.5 MG PO TABS
7.5000 mg | ORAL_TABLET | Freq: Once | ORAL | Status: AC
  Administered 2014-01-28: 7.5 mg via ORAL
  Filled 2014-01-28: qty 1

## 2014-01-28 MED ORDER — HEPARIN (PORCINE) IN NACL 100-0.45 UNIT/ML-% IJ SOLN
1100.0000 [IU]/h | INTRAMUSCULAR | Status: DC
  Administered 2014-01-28 – 2014-01-31 (×4): 1200 [IU]/h via INTRAVENOUS
  Filled 2014-01-28 (×6): qty 250

## 2014-01-28 MED ORDER — WARFARIN SODIUM 6 MG PO TABS
6.0000 mg | ORAL_TABLET | Freq: Once | ORAL | Status: DC
  Filled 2014-01-28: qty 1

## 2014-01-28 NOTE — Progress Notes (Signed)
ANTICOAGULATION CONSULT NOTE - Follow Up Consult  Pharmacy Consult for Heparin Indication: PE/DVT  Allergies  Allergen Reactions  . Clinoril [Sulindac] Rash  . Erythromycin Rash  . Penicillins Rash  . Prozac [Fluoxetine Hcl] Rash  . Sulfa Antibiotics Rash    Patient Measurements: Height: 5\' 4"  (162.6 cm) Weight: 194 lb 0.1 oz (88 kg) IBW/kg (Calculated) : 54.7 Heparin Dosing Weight: 74.3kg  Vital Signs: Temp: 98.8 F (37.1 C) (02/27 2005) Temp src: Oral (02/27 2005) BP: 133/81 mmHg (02/27 2005) Pulse Rate: 90 (02/27 2005)  Labs:  Recent Labs  01/26/14 0245  01/27/14 0303  01/28/14 0425 01/28/14 1433 01/28/14 2136  HGB 8.0*  --  8.4*  --  8.0*  --   --   HCT 25.1*  --  26.9*  --  25.1*  --   --   PLT 238  --  273  --  256  --   --   LABPROT 15.2  --  13.9  --  14.7  --   --   INR 1.23  --  1.09  --  1.17  --   --   HEPARINUNFRC 0.35  < > 0.28*  < > 0.26* 0.45 0.77*  < > = values in this interval not displayed.  Estimated Creatinine Clearance: 54.2 ml/min (by C-G formula based on Cr of 0.65).  Medications:  Heparin 900 units/hr  Assessment: 80 y/o F on heparin and warfarin for PE/DVT.  Pt has significant clot burden with right heart strain and was being worked up this admission for suspected GI bleed.  Heparin level was therapeutic earlier today but supratherapeutic tonight. Will decrease the rate  Goal of Therapy:  Heparin level 0.3-0.7 units/ml INR: 2-3 Monitor platelets by anticoagulation protocol: Yes   Plan:   Decrease heparin to 1200 units/hr F/u with level in AM

## 2014-01-28 NOTE — Progress Notes (Signed)
ANTICOAGULATION CONSULT NOTE - Follow Up Consult  Pharmacy Consult for heparin Indication: pulmonary embolus and DVT  Labs:  Recent Labs  01/26/14 0245  01/27/14 0303 01/27/14 1946 01/28/14 0425  HGB 8.0*  --  8.4*  --  8.0*  HCT 25.1*  --  26.9*  --  25.1*  PLT 238  --  273  --  256  LABPROT 15.2  --  13.9  --  14.7  INR 1.23  --  1.09  --  1.17  HEPARINUNFRC 0.35  < > 0.28* 0.41 0.26*  < > = values in this interval not displayed.   Assessment: 80yo female now subtherapeutic on heparin after one level at goal; H/H low but stable with no new episodes of bleeding reported.  Goal of Therapy:  Heparin level 0.3-0.7 units/ml   Plan:  Will increase heparin gtt by 1-2 units/kg/hr to 1300 units/hr and check level in [place], PharmD, BCPS  01/28/2014,6:32 AM

## 2014-01-28 NOTE — Clinical Social Work Note (Signed)
CSW continuing to monitor patient's progress for discharge to a skilled nursing facility for short-term rehab. CSW advised today that patient will be ready for discharge on Monday. CSW will assist with d/c to a SNF once medically stable.  Crystal [name], MSW, LCSW [phone]

## 2014-01-28 NOTE — Progress Notes (Addendum)
ANTICOAGULATION CONSULT NOTE - Follow Up Consult  Pharmacy Consult for Heparin Indication: PE/DVT  Allergies  Allergen Reactions  . Clinoril [Sulindac] Rash  . Erythromycin Rash  . Penicillins Rash  . Prozac [Fluoxetine Hcl] Rash  . Sulfa Antibiotics Rash    Patient Measurements: Height: 5\' 4"  (162.6 cm) Weight: 194 lb 0.1 oz (88 kg) IBW/kg (Calculated) : 54.7 Heparin Dosing Weight: 74.3kg  Vital Signs: Temp: 97.9 F (36.6 C) (02/27 0500) Temp src: Oral (02/27 0500) BP: 132/62 mmHg (02/27 0500) Pulse Rate: 62 (02/27 0500)  Labs:  Recent Labs  01/26/14 0245  01/27/14 0303 01/27/14 1946 01/28/14 0425  HGB 8.0*  --  8.4*  --  8.0*  HCT 25.1*  --  26.9*  --  25.1*  PLT 238  --  273  --  256  LABPROT 15.2  --  13.9  --  14.7  INR 1.23  --  1.09  --  1.17  HEPARINUNFRC 0.35  < > 0.28* 0.41 0.26*  < > = values in this interval not displayed.  Estimated Creatinine Clearance: 54.2 ml/min (by C-G formula based on Cr of 0.65).  Medications:  Heparin 900 units/hr  Assessment: 80 y/o F on heparin and warfarin for PE/DVT.  Pt has significant clot burden with right heart strain and was being worked up this admission for suspected GI bleed.  HL overnight, was subtherapeutic, rate was increased.  INR remains low, 1.17 today.  Her body must be metabolizing heparin and warfarin quickly.  Hemoglobin low but stable at 8, platelets are wnl.    Goal of Therapy:  Heparin level 0.3-0.7 units/ml INR: 2-3 Monitor platelets by anticoagulation protocol: Yes   Plan:  -Continue heparin at 1300 units/hr -Warfarin 7.5 mg po x1 -Daily HL, CBC, INR -Next HL at [place], PharmD, BCPS Clinical Pharmacist Pager: [phone] 01/28/2014 8:41 AM

## 2014-01-28 NOTE — Progress Notes (Signed)
Physical Therapy Treatment Patient Details Name: Crystal [name] MRN: [id number] DOB: [birth year] Today's Date: 01/28/2014 Time: 3875-6433 PT Time Calculation (min): 14 min  PT Assessment / Plan / Recommendation  History of Present Illness 80 y.o. female admitted on 01/17/2014 with a past medical history of Hypertension; Arthritis; GERD; Polio; Hyperthyroidism; Peripheral neuropathy; Shingles; IBS; Allergic sinusitis; Asthma; Diverticulosis; Benign tumor of pituitary gland (1999); IBS;  and Uterine cancer who presented with a complaint of dyspnea on exertion and extreme fatigue for 1 wk now. She also c/o chest tightness on admission. She was found to have extensive PEs per CT scan   PT Comments   Patient making slow gains with mobility and activity tolerance.  Agree with need for SNF at discharge.  Follow Up Recommendations  SNF     Does the patient have the potential to tolerate intense rehabilitation     Barriers to Discharge        Equipment Recommendations  None recommended by PT    Recommendations for Other Services    Frequency Min 3X/week   Progress towards PT Goals Progress towards PT goals: Progressing toward goals  Plan Current plan remains appropriate    Precautions / Restrictions Precautions Precautions: Fall Restrictions Weight Bearing Restrictions: No   Pertinent Vitals/Pain O2 sats at 94% while ambulating on room air    Mobility  Bed Mobility Overal bed mobility: Needs Assistance Bed Mobility: Supine to Sit;Sit to Supine Supine to sit: Supervision Sit to supine: Supervision General bed mobility comments: uses rail Transfers Overall transfer level: Needs assistance Equipment used: 1 person hand held assist Transfers: Sit to/from Stand Sit to Stand: Min assist General transfer comment: steadying assist, increased time Ambulation/Gait Ambulation/Gait assistance: Min assist Ambulation Distance (Feet): 68 Feet Assistive device: 1 person hand held  assist Gait Pattern/deviations: Step-through pattern;Decreased stride length;Wide base of support Gait velocity: slow Gait velocity interpretation: Below normal speed for age/gender General Gait Details: Patient with unsteady gait with wide BOS.  Needed standing rest break due to fatigue, DOE.  O2 sats at 94% with ambulation on room air.      PT Goals (current goals can now be found in the care plan section)    Visit Information  Last PT Received On: 01/28/14 Assistance Needed: +1 History of Present Illness: 80 y.o. female admitted on 01/17/2014 with a past medical history of Hypertension; Arthritis; GERD; Polio; Hyperthyroidism; Peripheral neuropathy; Shingles; IBS; Allergic sinusitis; Asthma; Diverticulosis; Benign tumor of pituitary gland (1999); IBS;  and Uterine cancer who presented with a complaint of dyspnea on exertion and extreme fatigue for 1 wk now. She also c/o chest tightness on admission. She was found to have extensive PEs per CT scan    Subjective Data  Subjective: I haven't been up much.   Cognition  Cognition Arousal/Alertness: Awake/alert Behavior During Therapy: WFL for tasks assessed/performed Overall Cognitive Status: Within Functional Limits for tasks assessed    Balance     End of Session PT - End of Session Equipment Utilized During Treatment: Gait belt Activity Tolerance: Patient limited by fatigue Patient left: in bed;with call bell/phone within reach;with bed alarm set Nurse Communication: Mobility status   GP     Despina Pole 01/28/2014, 7:30 PM Carita Pian. Sanjuana Kava, [place] Pager [phone]

## 2014-01-29 LAB — CBC
HCT: 26.7 % — ABNORMAL LOW (ref 36.0–46.0)
HEMOGLOBIN: 8.3 g/dL — AB (ref 12.0–15.0)
MCH: 26 pg (ref 26.0–34.0)
MCHC: 31.1 g/dL (ref 30.0–36.0)
MCV: 83.7 fL (ref 78.0–100.0)
Platelets: 321 10*3/uL (ref 150–400)
RBC: 3.19 MIL/uL — AB (ref 3.87–5.11)
RDW: 15.7 % — ABNORMAL HIGH (ref 11.5–15.5)
WBC: 6 10*3/uL (ref 4.0–10.5)

## 2014-01-29 LAB — HEPARIN LEVEL (UNFRACTIONATED)
HEPARIN UNFRACTIONATED: 0.66 [IU]/mL (ref 0.30–0.70)
Heparin Unfractionated: 0.58 IU/mL (ref 0.30–0.70)

## 2014-01-29 LAB — PROTIME-INR
INR: 1.27 (ref 0.00–1.49)
Prothrombin Time: 15.6 seconds — ABNORMAL HIGH (ref 11.6–15.2)

## 2014-01-29 MED ORDER — WARFARIN SODIUM 10 MG PO TABS
10.0000 mg | ORAL_TABLET | Freq: Once | ORAL | Status: AC
  Administered 2014-01-29: 10 mg via ORAL
  Filled 2014-01-29 (×2): qty 1

## 2014-01-29 NOTE — Progress Notes (Signed)
ANTICOAGULATION CONSULT NOTE - Follow Up Consult  Pharmacy Consult for heparin Indication: pulmonary embolus and DVT  Labs:  Recent Labs  01/27/14 0303  01/28/14 0425 01/28/14 1433 01/28/14 2136 01/29/14 0305  HGB 8.4*  --  8.0*  --   --  8.3*  HCT 26.9*  --  25.1*  --   --  26.7*  PLT 273  --  256  --   --  321  LABPROT 13.9  --  14.7  --   --  15.6*  INR 1.09  --  1.17  --   --  1.27  HEPARINUNFRC 0.28*  < > 0.26* 0.45 0.77* 0.58  < > = values in this interval not displayed.   Assessment/Plan:  80yo female now therapeutic on heparin after rate decrease.  Will continue gtt at current rate and confirm stable with additonal level.  Wynona Neat, PharmD, BCPS  01/29/2014,4:51 AM

## 2014-01-29 NOTE — Progress Notes (Signed)
DC tele per MD verbal order.  Carollee Sires, RN

## 2014-01-29 NOTE — Progress Notes (Signed)
[facility] PROGRESS NOTE  Crystal [name] NID:[id number] DOB: 07-May-1925 DOA: 01/17/2014 PCP: Mathews Argyle, MD  Assessment/Plan: 80 y.o. female admitted on 01/17/2014 with a past medical history of Hypertension; Arthritis; GERD; Polio; Hyperthyroidism; Peripheral neuropathy; Shingles; IBS; Allergic sinusitis; Asthma; Diverticulosis; Benign tumor of pituitary gland (1999); IBS; and Uterine cancer who presented with a complaint of dyspnea on exertion and extreme fatigue for 1 wk now. She also c/o chest tightness on admission. She was found to have extensive PEs per CT scan -started on [facility] but developed GIB, s/p EGD, s/p IVC filter, now restarted on anticoagulation  1. Bilateral pulmonary emboli; patient is evaluated by Pulm recommended  lifelong AC tx  -echo: LVEF 65%, reduced RV function; pulmonary HTN; no s/s of fluids overload; monitor off diuretics  -she was on IV heparin --> transitioned to Eliquis 2/17 -2/20: + melena, +occult blood; -->d/c apixaban, started IV heparin due to significant PE;  -s/p EGD no acute bleeding  -2/23: s/p IVC;  - restarted on anticoagulation due to significant PE DOE, R heart strain; started coumadin 2/25, with heparin bridge  2. B LE Posterior Tibial V DVT anticoag as noted above  -s/p IVC 2/23; appreciate vascular surgery evaluation, s/p IVC filter   3. Acute blood loss anemia on anticoagulation with GIB; d/c apixaban 2/20;  -Tf sed 1 unit 2/20; monitor Hg, TF prn; no new episodes of bleeding  -s/p EGD: gastric polyps no obvious source for bleeding    3. Hypotension with h/o HTN  -resolved -resume Amlodipine  4. COPD No longer smoking , reports cough, cont bronchodilators, added advair, was levofloxacin (d/c on 2/24)    5. GERD/ Diverticulosis -may need colonoscopy if re bleeds   6. Hypothyroid  -cont Synthroid    7. History of polio with right upper extremity weakness -PT recommended SNF   D/w pt and son -  if rebleeds  significantly may need to reconsider anticoagulation, and palliative care  -  Prognosis guarded   DC Tele  Code Status: full Family Communication: d/w patient, updated [name] [phone]   Disposition Plan: SNF 2-3 days    Consultants:  Pulmonary   Procedures:  Non e  Antibiotics:  Levofloxacin 2/19 (indicate start date, and stop date if known)  HPI/Subjective: Denies any complaints  Objective: Filed Vitals:   01/29/14 2100  BP:   Pulse: 80  Temp:   Resp: 18    Intake/Output Summary (Last 24 hours) at 01/29/14 2111 Last data filed at 01/29/14 2020  Gross per 24 hour  Intake   1000 ml  Output    400 ml  Net    600 ml   Filed Weights   01/18/14 0155  Weight: 88 kg (194 lb 0.1 oz)    Exam:   General:  alert  Cardiovascular: s1,s2 rrr  Respiratory: CTA BL  Abdomen: soft, nt, nd   Musculoskeletal: no LE edema   Data Reviewed: Basic Metabolic Panel:  Recent Labs Lab 01/24/14 1045  NA 135*  K 3.9  CL 98  CO2 28  GLUCOSE 86  BUN 9  CREATININE 0.65  CALCIUM 8.4   Liver Function Tests: No results found for this basename: AST, ALT, ALKPHOS, BILITOT, PROT, ALBUMIN,  in the last 168 hours No results found for this basename: LIPASE, AMYLASE,  in the last 168 hours No results found for this basename: AMMONIA,  in the last 168 hours CBC:  Recent Labs Lab 01/25/14 0326 01/26/14 0245 01/27/14 0303 01/28/14 0425 01/29/14 0305  WBC 4.7 5.0 5.3 5.0 6.0  HGB 8.2* 8.0* 8.4* 8.0* 8.3*  HCT 25.5* 25.1* 26.9* 25.1* 26.7*  MCV 83.9 83.1 83.5 83.7 83.7  PLT 215 238 273 256 321   Cardiac Enzymes: No results found for this basename: CKTOTAL, CKMB, CKMBINDEX, TROPONINI,  in the last 168 hours BNP (last 3 results)  Recent Labs  01/17/14 2002  PROBNP 604.6*   CBG: No results found for this basename: GLUCAP,  in the last 168 hours  No results found for this or any previous visit (from the past 240 hour(s)).   Studies: No results  found.  Scheduled Meds: . amLODipine  5 mg Oral Daily  . [START ON 01/30/2014] cabergoline  0.25 mg Oral Q14 Days  . calcium carbonate  1 tablet Oral BID WC  . docusate sodium  100 mg Oral BID  . gabapentin  400 mg Oral Daily  . gabapentin  800 mg Oral QHS  . guaiFENesin  1,200 mg Oral BID  . levothyroxine  100 mcg Oral QAC breakfast  . mirtazapine  60 mg Oral QHS  . mometasone-formoterol  2 puff Inhalation BID  . pantoprazole  40 mg Oral Daily  . pramipexole  0.75 mg Oral QHS  . Warfarin - Pharmacist Dosing Inpatient   Does not apply q1800   Continuous Infusions: . heparin 1,200 Units/hr (01/29/14 1800)    Principal Problem:   Bilateral pulmonary embolism Active Problems:   PE (pulmonary embolism)   DVT (deep venous thrombosis)   Hypotension, unspecified    Time spent: >35 minute s    [facility] Pager [phone]. If 7PM-7AM, please contact night-coverage at [email or web address], password [facility] 01/29/2014, 9:11 PM  LOS: 12 days

## 2014-01-29 NOTE — Progress Notes (Signed)
[facility] PROGRESS NOTE  KELBI RENSTROM EHM:[id number] DOB: 08-Jul-1925 DOA: 01/17/2014 PCP: Mathews Argyle, MD  Assessment/Plan: 80 y.o. female admitted on 01/17/2014 with a past medical history of Hypertension; Arthritis; GERD; Polio; Hyperthyroidism; Peripheral neuropathy; Shingles; IBS; Allergic sinusitis; Asthma; Diverticulosis; Benign tumor of pituitary gland (1999); IBS; and Uterine cancer who presented with a complaint of dyspnea on exertion and extreme fatigue for 1 wk now. She also c/o chest tightness on admission. She was found to have extensive PEs per CT scan -started on [facility] but developed GIB, s/p EGD, s/p IVC filter, now restarted on anticoagulation  1. Bilateral pulmonary emboli; patient is evaluated by Pulm recommended  lifelong AC tx  -echo: LVEF 65%, reduced RV function; pulmonary HTN; no s/s of fluids overload; monitor off diuretics  -she was on IV heparin --> transitioned to Eliquis 2/17 -2/20: + melena, +occult blood; -->d/c apixaban, started IV heparin due to significant PE;  -s/p EGD no acute bleeding  -2/23: s/p IVC;  - restarted on anticoagulation due to significant PE DOE, R heart strain; started coumadin 2/25, with heparin bridge  2. B LE Posterior Tibial V DVT anticoag as noted above  -s/p IVC 2/23; appreciate vascular surgery evaluation, s/p IVC filter   3. Acute blood loss anemia on anticoagulation with GIB; d/c apixaban 2/20;  -Tf sed 1 unit 2/20; monitor Hg, TF prn; no new episodes of bleeding  -s/p EGD: gastric polyps no obvious source for bleeding  -hb stable   3. Hypotension with h/o HTN  -resolved -resumed Amlodipine  4. COPD No longer smoking , reports cough, cont bronchodilators, added advair, was levofloxacin (d/c on 2/24)    5. GERD/ Diverticulosis -may need colonoscopy if re bleeds   6. Hypothyroid  -cont Synthroid    7. History of polio with right upper extremity weakness -PT recommended SNF   D/w patient ,  -  if  rebleeds significantly may need to reconsider anticoagulation, and palliative care  -  Prognosis guarded   Code Status: full Family Communication: d/w patient, updated [name] [phone]  (indicate person spoken with, relationship, and if by phone, the number) Disposition Plan: SNF 2-3 days    Consultants:  Pulmonary   Procedures:  Non e  Antibiotics:  Levofloxacin 2/19 (indicate start date, and stop date if known)  HPI/Subjective: Denies any complaints  Objective: Filed Vitals:   01/29/14 2100  BP:   Pulse: 80  Temp:   Resp: 18    Intake/Output Summary (Last 24 hours) at 01/29/14 2115 Last data filed at 01/29/14 2020  Gross per 24 hour  Intake   1000 ml  Output    400 ml  Net    600 ml   Filed Weights   01/18/14 0155  Weight: 88 kg (194 lb 0.1 oz)    Exam:   General:  alert  Cardiovascular: s1,s2 rrr  Respiratory: CTA BL  Abdomen: soft, nt, nd   Musculoskeletal: no LE edema   Data Reviewed: Basic Metabolic Panel:  Recent Labs Lab 01/24/14 1045  NA 135*  K 3.9  CL 98  CO2 28  GLUCOSE 86  BUN 9  CREATININE 0.65  CALCIUM 8.4   Liver Function Tests: No results found for this basename: AST, ALT, ALKPHOS, BILITOT, PROT, ALBUMIN,  in the last 168 hours No results found for this basename: LIPASE, AMYLASE,  in the last 168 hours No results found for this basename: AMMONIA,  in the last 168 hours CBC:  Recent Labs Lab 01/25/14  4431 01/26/14 0245 01/27/14 0303 01/28/14 0425 01/29/14 0305  WBC 4.7 5.0 5.3 5.0 6.0  HGB 8.2* 8.0* 8.4* 8.0* 8.3*  HCT 25.5* 25.1* 26.9* 25.1* 26.7*  MCV 83.9 83.1 83.5 83.7 83.7  PLT 215 238 273 256 321   Cardiac Enzymes: No results found for this basename: CKTOTAL, CKMB, CKMBINDEX, TROPONINI,  in the last 168 hours BNP (last 3 results)  Recent Labs  01/17/14 2002  PROBNP 604.6*   CBG: No results found for this basename: GLUCAP,  in the last 168 hours  No results found for this or any  previous visit (from the past 240 hour(s)).   Studies: No results found.  Scheduled Meds: . amLODipine  5 mg Oral Daily  . [START ON 01/30/2014] cabergoline  0.25 mg Oral Q14 Days  . calcium carbonate  1 tablet Oral BID WC  . docusate sodium  100 mg Oral BID  . gabapentin  400 mg Oral Daily  . gabapentin  800 mg Oral QHS  . guaiFENesin  1,200 mg Oral BID  . levothyroxine  100 mcg Oral QAC breakfast  . mirtazapine  60 mg Oral QHS  . mometasone-formoterol  2 puff Inhalation BID  . pantoprazole  40 mg Oral Daily  . pramipexole  0.75 mg Oral QHS  . Warfarin - Pharmacist Dosing Inpatient   Does not apply q1800   Continuous Infusions: . heparin 1,200 Units/hr (01/29/14 1800)    Principal Problem:   Bilateral pulmonary embolism Active Problems:   PE (pulmonary embolism)   DVT (deep venous thrombosis)   Hypotension, unspecified    Time spent: >35 minute s    [facility] Pager [phone]. If 7PM-7AM, please contact night-coverage at [email or web address], password [facility] 01/29/2014, 9:15 PM  LOS: 12 days

## 2014-01-29 NOTE — Progress Notes (Signed)
ANTICOAGULATION CONSULT NOTE - Follow Up Consult  Pharmacy Consult for Heparin and Coumadin Indication: bilateral PE/DVT  Allergies  Allergen Reactions  . Clinoril [Sulindac] Rash  . Erythromycin Rash  . Penicillins Rash  . Prozac [Fluoxetine Hcl] Rash  . Sulfa Antibiotics Rash    Patient Measurements: Height: 5\' 4"  (162.6 cm) Weight: 194 lb 0.1 oz (88 kg) IBW/kg (Calculated) : 54.7 Heparin Dosing Weight: 74kg  Vital Signs: Temp: 98.4 F (36.9 C) (02/28 0508) Temp src: Oral (02/28 0508) BP: 130/76 mmHg (02/28 0508) Pulse Rate: 87 (02/28 0508)  Labs:  Recent Labs  01/27/14 0303  01/28/14 0425  01/28/14 2136 01/29/14 0305 01/29/14 1026  HGB 8.4*  --  8.0*  --   --  8.3*  --   HCT 26.9*  --  25.1*  --   --  26.7*  --   PLT 273  --  256  --   --  321  --   LABPROT 13.9  --  14.7  --   --  15.6*  --   INR 1.09  --  1.17  --   --  1.27  --   HEPARINUNFRC 0.28*  < > 0.26*  < > 0.77* 0.58 0.66  < > = values in this interval not displayed.  Estimated Creatinine Clearance: 54.2 ml/min (by C-G formula based on Cr of 0.65).   Medications:  Heparin @ 1200 units/hr  Assessment: 2/16: CT showed bilateral PE, IV heparin started 2/17: Dopplers showed bilateral DVT, transitioned to apixaban 2/20: Developed acute drop in Hgb, ?GIB, apixaban stopped, heparin restarted 2/21: EGD negative for bleeding 2/23: IVC placed and coumadin initiated  Patient continues on heparin and coumadin. Heparin level is at goal. INR remains below goal but starting to trend up. Hgb is low but stable, platelets stable.   Goal of Therapy:  INR 2-3 Heparin level 0.3-0.7 Monitor platelets by anticoagulation protocol: Yes   Plan:  1) Increase coumadin to 10mg  x 1 2) Continue heparin at 1200 units/hr 3) Heparin level, INR, CBC in AM  Deboraha Sprang 01/29/2014,11:44 AM

## 2014-01-30 LAB — CBC
HCT: 25.2 % — ABNORMAL LOW (ref 36.0–46.0)
HEMOGLOBIN: 8 g/dL — AB (ref 12.0–15.0)
MCH: 26.1 pg (ref 26.0–34.0)
MCHC: 31.7 g/dL (ref 30.0–36.0)
MCV: 82.1 fL (ref 78.0–100.0)
Platelets: 338 10*3/uL (ref 150–400)
RBC: 3.07 MIL/uL — ABNORMAL LOW (ref 3.87–5.11)
RDW: 15.8 % — AB (ref 11.5–15.5)
WBC: 5.3 10*3/uL (ref 4.0–10.5)

## 2014-01-30 LAB — HEPARIN LEVEL (UNFRACTIONATED): HEPARIN UNFRACTIONATED: 0.64 [IU]/mL (ref 0.30–0.70)

## 2014-01-30 LAB — PROTIME-INR
INR: 1.48 (ref 0.00–1.49)
Prothrombin Time: 17.5 seconds — ABNORMAL HIGH (ref 11.6–15.2)

## 2014-01-30 MED ORDER — FUROSEMIDE 10 MG/ML IJ SOLN
20.0000 mg | Freq: Every day | INTRAMUSCULAR | Status: DC
  Administered 2014-01-30 – 2014-01-31 (×2): 20 mg via INTRAVENOUS
  Filled 2014-01-30 (×2): qty 2

## 2014-01-30 MED ORDER — WARFARIN SODIUM 10 MG PO TABS
10.0000 mg | ORAL_TABLET | Freq: Once | ORAL | Status: AC
  Administered 2014-01-30: 10 mg via ORAL
  Filled 2014-01-30: qty 1

## 2014-01-30 NOTE — Progress Notes (Signed)
[facility] PROGRESS NOTE  Crystal [name] JJK:[id number] DOB: May 26, 1925 DOA: 01/17/2014 PCP: Mathews Argyle, MD  Assessment/Plan: 80 y.o. female admitted on 01/17/2014 with a past medical history of Hypertension; Arthritis; GERD; Polio; Hyperthyroidism; Peripheral neuropathy; Shingles; IBS; Allergic sinusitis; Asthma; Diverticulosis; Benign tumor of pituitary gland (1999); IBS; and Uterine cancer who presented with a complaint of dyspnea on exertion and extreme fatigue for 1 wk now. She also c/o chest tightness on admission. She was found to have extensive PEs per CT scan -started on [facility] but developed GIB, s/p EGD, s/p IVC filter, now restarted on anticoagulation  1. Bilateral pulmonary emboli; patient is evaluated by Pulm recommended  lifelong AC tx  -echo: LVEF 65%, reduced RV function; pulmonary HTN; no s/s of fluids overload; monitor off diuretics  -she was on IV heparin --> transitioned to Eliquis 2/17 -2/20: + melena, +occult blood; -->d/c apixaban, started IV heparin due to significant PE;  -s/p EGD no acute bleeding  -2/23: s/p IVC;  - restarted on anticoagulation due to significant PE DOE, R heart strain; started coumadin 2/25, with heparin bridge, pharmacy dosing -slightly volume overloaded, iatrogenic, will give 20mg  IV lasix  2. B LE Posterior Tibial V DVT anticoag as noted above  -s/p IVC 2/23; appreciate vascular surgery evaluation, s/p IVC filter   3. Acute blood loss anemia on anticoagulation with GIB; d/c apixaban 2/20;  -Tf sed 1 unit 2/20; monitor Hg, TF prn; no new episodes of bleeding  -s/p EGD: gastric polyps no obvious source for bleeding    3. Hypotension with h/o HTN  -resolved -resume Amlodipine  4. COPD No longer smoking , reports cough, cont bronchodilators, added advair, was levofloxacin (d/c on 2/24)    5. GERD/ Diverticulosis -may need colonoscopy if re bleeds   6. Hypothyroid  -cont Synthroid    7. History of polio with right upper  extremity weakness -PT recommended SNF   D/w pt and son -  if rebleeds significantly may need to reconsider anticoagulation, and palliative care  -  Prognosis guarded   DC Tele  Code Status: full Family Communication: d/w patient, updated [name] [phone]   Disposition Plan: SNF 1-2days    Consultants:  Pulmonary   Procedures:  None  Antibiotics:  Levofloxacin 2/19 (indicate start date, and stop date if known)-stopped   HPI/Subjective: Denies any complaints, some cough with activity  Objective: Filed Vitals:   01/30/14 0900  BP: 128/52  Pulse: 69  Temp:   Resp:     Intake/Output Summary (Last 24 hours) at 01/30/14 1133 Last data filed at 01/30/14 1016  Gross per 24 hour  Intake  939.8 ml  Output    200 ml  Net  739.8 ml   Filed Weights   01/18/14 0155  Weight: 88 kg (194 lb 0.1 oz)    Exam:   General:  alert  Cardiovascular: s1,s2 rrr  Respiratory: fine basilar crcakles  Abdomen: soft, nt, nd   Musculoskeletal: 1 plus LE edema   Data Reviewed: Basic Metabolic Panel:  Recent Labs Lab 01/24/14 1045  NA 135*  K 3.9  CL 98  CO2 28  GLUCOSE 86  BUN 9  CREATININE 0.65  CALCIUM 8.4   Liver Function Tests: No results found for this basename: AST, ALT, ALKPHOS, BILITOT, PROT, ALBUMIN,  in the last 168 hours No results found for this basename: LIPASE, AMYLASE,  in the last 168 hours No results found for this basename: AMMONIA,  in the last 168 hours CBC:  Recent Labs Lab 01/26/14 0245 01/27/14 0303 01/28/14 0425 01/29/14 0305 01/30/14 0405  WBC 5.0 5.3 5.0 6.0 5.3  HGB 8.0* 8.4* 8.0* 8.3* 8.0*  HCT 25.1* 26.9* 25.1* 26.7* 25.2*  MCV 83.1 83.5 83.7 83.7 82.1  PLT 238 273 256 321 338   Cardiac Enzymes: No results found for this basename: CKTOTAL, CKMB, CKMBINDEX, TROPONINI,  in the last 168 hours BNP (last 3 results)  Recent Labs  01/17/14 2002  PROBNP 604.6*   CBG: No results found for this basename: GLUCAP,   in the last 168 hours  No results found for this or any previous visit (from the past 240 hour(s)).   Studies: No results found.  Scheduled Meds: . amLODipine  5 mg Oral Daily  . cabergoline  0.25 mg Oral Q14 Days  . calcium carbonate  1 tablet Oral BID WC  . docusate sodium  100 mg Oral BID  . furosemide  20 mg Intravenous Daily  . gabapentin  400 mg Oral Daily  . gabapentin  800 mg Oral QHS  . guaiFENesin  1,200 mg Oral BID  . levothyroxine  100 mcg Oral QAC breakfast  . mirtazapine  60 mg Oral QHS  . mometasone-formoterol  2 puff Inhalation BID  . pantoprazole  40 mg Oral Daily  . pramipexole  0.75 mg Oral QHS  . warfarin  10 mg Oral ONCE-1800  . Warfarin - Pharmacist Dosing Inpatient   Does not apply q1800   Continuous Infusions: . heparin 1,200 Units/hr (01/29/14 1800)    Principal Problem:   Bilateral pulmonary embolism Active Problems:   PE (pulmonary embolism)   DVT (deep venous thrombosis)   Hypotension, unspecified    Time spent: >25 minute s    [facility] Pager [phone]. If 7PM-7AM, please contact night-coverage at [email or web address], password [facility] 01/30/2014, 11:33 AM  LOS: 13 days

## 2014-01-30 NOTE — Progress Notes (Signed)
ANTICOAGULATION CONSULT NOTE - Follow Up Consult  Pharmacy Consult for Heparin and Coumadin Indication: bilateral PE/DVT  Allergies  Allergen Reactions  . Clinoril [Sulindac] Rash  . Erythromycin Rash  . Penicillins Rash  . Prozac [Fluoxetine Hcl] Rash  . Sulfa Antibiotics Rash    Patient Measurements: Height: 5\' 4"  (162.6 cm) Weight: 194 lb 0.1 oz (88 kg) IBW/kg (Calculated) : 54.7 Heparin Dosing Weight: 74kg  Vital Signs: Temp: 98.2 F (36.8 C) (03/01 0515) Temp src: Oral (03/01 0515) BP: 128/52 mmHg (03/01 0900) Pulse Rate: 69 (03/01 0900)  Labs:  Recent Labs  01/28/14 0425  01/29/14 0305 01/29/14 1026 01/30/14 0405  HGB 8.0*  --  8.3*  --  8.0*  HCT 25.1*  --  26.7*  --  25.2*  PLT 256  --  321  --  338  LABPROT 14.7  --  15.6*  --  17.5*  INR 1.17  --  1.27  --  1.48  HEPARINUNFRC 0.26*  < > 0.58 0.66 0.64  < > = values in this interval not displayed.  Estimated Creatinine Clearance: 54.2 ml/min (by C-G formula based on Cr of 0.65).   Medications:  Heparin @ 1200 units/hr  Assessment: 2/16: CT showed bilateral PE, IV heparin started 2/17: Dopplers showed bilateral DVT, transitioned to apixaban 2/20: Developed acute drop in Hgb, ?GIB, apixaban stopped, heparin restarted 2/21: EGD negative for bleeding 2/23: IVC placed and coumadin initiated  Patient continues on heparin and coumadin. Heparin level is at goal. INR remains below goal but trending up. Hgb is low but stable, platelets stable.   Goal of Therapy:  INR 2-3 Heparin level 0.3-0.7 Monitor platelets by anticoagulation protocol: Yes   Plan:  1) Repeat coumadin to 10mg  x 1 2) Continue heparin at 1200 units/hr 3) Heparin level, INR, CBC in AM  Deboraha Sprang 01/30/2014,10:39 AM

## 2014-01-31 LAB — BASIC METABOLIC PANEL
BUN: 13 mg/dL (ref 6–23)
CALCIUM: 8.6 mg/dL (ref 8.4–10.5)
CO2: 24 mEq/L (ref 19–32)
Chloride: 96 mEq/L (ref 96–112)
Creatinine, Ser: 0.78 mg/dL (ref 0.50–1.10)
GFR calc Af Amer: 85 mL/min — ABNORMAL LOW (ref >90)
GFR, EST NON AFRICAN AMERICAN: 74 mL/min — AB (ref >90)
Glucose, Bld: 91 mg/dL (ref 70–99)
POTASSIUM: 4 meq/L (ref 3.7–5.3)
Sodium: 131 mEq/L — ABNORMAL LOW (ref 137–147)

## 2014-01-31 LAB — PROTIME-INR
INR: 1.87 — AB (ref 0.00–1.49)
PROTHROMBIN TIME: 21 s — AB (ref 11.6–15.2)

## 2014-01-31 LAB — CBC
HCT: 25.8 % — ABNORMAL LOW (ref 36.0–46.0)
Hemoglobin: 8.2 g/dL — ABNORMAL LOW (ref 12.0–15.0)
MCH: 26 pg (ref 26.0–34.0)
MCHC: 31.8 g/dL (ref 30.0–36.0)
MCV: 81.9 fL (ref 78.0–100.0)
PLATELETS: 345 10*3/uL (ref 150–400)
RBC: 3.15 MIL/uL — AB (ref 3.87–5.11)
RDW: 15.9 % — ABNORMAL HIGH (ref 11.5–15.5)
WBC: 6.4 10*3/uL (ref 4.0–10.5)

## 2014-01-31 LAB — HEPARIN LEVEL (UNFRACTIONATED): Heparin Unfractionated: 0.52 IU/mL (ref 0.30–0.70)

## 2014-01-31 MED ORDER — WARFARIN SODIUM 7.5 MG PO TABS
7.5000 mg | ORAL_TABLET | Freq: Once | ORAL | Status: DC
  Filled 2014-01-31: qty 1

## 2014-01-31 MED ORDER — WARFARIN SODIUM 10 MG PO TABS
10.0000 mg | ORAL_TABLET | Freq: Once | ORAL | Status: AC
  Administered 2014-01-31: 10 mg via ORAL
  Filled 2014-01-31 (×2): qty 1

## 2014-01-31 MED ORDER — ONDANSETRON HCL 4 MG/2ML IJ SOLN
4.0000 mg | Freq: Four times a day (QID) | INTRAMUSCULAR | Status: DC | PRN
  Administered 2014-01-31: 4 mg via INTRAVENOUS
  Filled 2014-01-31: qty 2

## 2014-01-31 MED ORDER — POLYETHYLENE GLYCOL 3350 17 G PO PACK
17.0000 g | PACK | Freq: Every day | ORAL | Status: DC
  Administered 2014-01-31 – 2014-02-01 (×2): 17 g via ORAL
  Filled 2014-01-31 (×2): qty 1

## 2014-01-31 NOTE — Progress Notes (Addendum)
ANTICOAGULATION CONSULT NOTE - Follow Up Consult  Pharmacy Consult for Heparin and Coumadin Indication: bilateral PE/DVT  Allergies  Allergen Reactions  . Clinoril [Sulindac] Rash  . Erythromycin Rash  . Penicillins Rash  . Prozac [Fluoxetine Hcl] Rash  . Sulfa Antibiotics Rash    Patient Measurements: Height: 5\' 4"  (162.6 cm) Weight: 194 lb 0.1 oz (88 kg) IBW/kg (Calculated) : 54.7 Heparin Dosing Weight: 74kg  Vital Signs: Temp: 98.1 F (36.7 C) (03/02 0602) Temp src: Oral (03/02 0602) BP: 123/43 mmHg (03/02 0602) Pulse Rate: 59 (03/02 0602)  Labs:  Recent Labs  01/29/14 0305 01/29/14 1026 01/30/14 0405 01/31/14 0355  HGB 8.3*  --  8.0* 8.2*  HCT 26.7*  --  25.2* 25.8*  PLT 321  --  338 345  LABPROT 15.6*  --  17.5* 21.0*  INR 1.27  --  1.48 1.87*  HEPARINUNFRC 0.58 0.66 0.64 0.52  CREATININE  --   --   --  0.78    Estimated Creatinine Clearance: 54.2 ml/min (by C-G formula based on Cr of 0.78).   Medications:  Heparin @ 1200 units/hr  Assessment: 80 yo who is on heparin and coumadin for PE/DVT. INR has been trending up and heparin is therapeutic. INR is 1.87 today  Goal of Therapy:  INR 2-3 Heparin level 0.3-0.7 Monitor platelets by anticoagulation protocol: Yes   Plan:   Cont heparin at 1200 units/hr Coumadin 10mg   PO x1 Daily INR, HL

## 2014-01-31 NOTE — Progress Notes (Signed)
CSW continuing to monitor patient's progress for discharge to a skilled nursing facility for short-term rehab once medically stable  Crystal [name], MSW, [place]

## 2014-01-31 NOTE — Clinical Documentation Improvement (Signed)
Possible Clinical Conditions? Acute Systolic Congestive Heart Failure Acute Diastolic Congestive Heart Failure Acute Systolic and Diastolic Heart Failure Other Condition  Supporting Information: Risk Factors: HTN, pulmonary HTN, Rt heart strain from Bilateral PE's Signs & Symptoms:fine basilar crackles progress note:3/1  Pro BNP: 604.6 01/17/2014; 1+ LE edema; DOE, extreme fatigue Diagnostics: ECHO: Right ventricle: The cavity size was mildly dilated. Wall thickness was normal. Systolic function was moderately to severely reduced. Treatment: Lasix 20 mgs daily injection 3/1  Thank You, Joya Salm ,RN Clinical Documentation Specialist:  [phone]  [place] Information Management

## 2014-01-31 NOTE — Progress Notes (Signed)
Physical Therapy Treatment Patient Details Name: Crystal [name] MRN: [id number] DOB: Jul 04, 1925 Today's Date: February 28, 2014    History of Present Illness 80 y.o. female admitted on 01/17/2014 with a past medical history of Hypertension; Arthritis; GERD; Polio; Hyperthyroidism; Peripheral neuropathy; Shingles; IBS; Allergic sinusitis; Asthma; Diverticulosis; Benign tumor of pituitary gland (1999); IBS;  and Uterine cancer who presented with a complaint of dyspnea on exertion and extreme fatigue for 1 wk now. She also c/o chest tightness on admission. She was found to have extensive PEs per CT scan    PT Comments    Pt feeling nauseous and required min encouragement to participate. Felt slightly worse after walking and did not want to perform further exercises. Reports she used cane PTA "when going out" and furniture walked when indoors. Will attempt use of cane next visit (pt unable to use a RW due to RUE weakness s/p polio).  Follow Up Recommendations  SNF     Equipment Recommendations  None recommended by PT    Recommendations for Other Services    Precautions / Restrictions Precautions Precautions: Fall Restrictions Weight Bearing Restrictions: No    Home Living                       Prior Function              Pertinent Vitals/Pain        SaO2 95% throughout session on RA  Mobility  Bed Mobility Overal bed mobility: Modified Independent Bed Mobility: Supine to Sit     Supine to sit: Modified independent (Device/Increase time);HOB elevated     General bed mobility comments: uses rail  Transfers Overall transfer level: Needs assistance Equipment used: None Transfers: Sit to/from Stand Sit to Stand: Supervision         General transfer comment: supervision for safety due to lack of recent mobility  Ambulation/Gait Ambulation/Gait assistance: Min guard Ambulation Distance (Feet): 150 Feet Assistive device:  (pushing IV pole) Gait  Pattern/deviations: Step-through pattern;Decreased stride length Gait velocity: slow   General Gait Details: pt walks with toes turned out to widen her base of support; no overt loss of balance while holding IV pole (pt usually uses a cane); required 3 standing rest breaks due to dyspnea                 Balance                                    Exercises        Cognition Arousal/Alertness: Awake/alert Behavior During Therapy: WFL for tasks assessed/performed Overall Cognitive Status: Within Functional Limits for tasks assessed                      General Comments     PT Goals (current goals can now be found in the care plan section)    Frequency  Min 2X/week    PT Plan Current plan remains appropriate    End of Session Equipment Utilized During Treatment: Gait belt Activity Tolerance: Patient limited by fatigue Patient left: with call bell/phone within reach;in chair     Time: 9562-1308 PT Time Calculation (min): 18 min  Charges:  $Gait Training: 8-22 mins                    G Codes:      Jolon Degante 02-28-14, 4:22 PM  Pager [phone]

## 2014-02-01 LAB — CBC
HCT: 27.8 % — ABNORMAL LOW (ref 36.0–46.0)
Hemoglobin: 9 g/dL — ABNORMAL LOW (ref 12.0–15.0)
MCH: 26.5 pg (ref 26.0–34.0)
MCHC: 32.4 g/dL (ref 30.0–36.0)
MCV: 81.8 fL (ref 78.0–100.0)
PLATELETS: 401 10*3/uL — AB (ref 150–400)
RBC: 3.4 MIL/uL — ABNORMAL LOW (ref 3.87–5.11)
RDW: 15.7 % — ABNORMAL HIGH (ref 11.5–15.5)
WBC: 7.4 10*3/uL (ref 4.0–10.5)

## 2014-02-01 LAB — PROTIME-INR
INR: 2.28 — AB (ref 0.00–1.49)
PROTHROMBIN TIME: 24.4 s — AB (ref 11.6–15.2)

## 2014-02-01 LAB — HEPARIN LEVEL (UNFRACTIONATED): Heparin Unfractionated: 0.74 IU/mL — ABNORMAL HIGH (ref 0.30–0.70)

## 2014-02-01 MED ORDER — POLYETHYLENE GLYCOL 3350 17 G PO PACK: 17.0000 g | PACK | Freq: Every day | ORAL | Status: DC | PRN

## 2014-02-01 MED ORDER — WARFARIN SODIUM 4 MG PO TABS: 4.0000 mg | ORAL_TABLET | Freq: Every day | ORAL | Status: AC

## 2014-02-01 MED ORDER — PANTOPRAZOLE SODIUM 40 MG PO TBEC: 40.0000 mg | DELAYED_RELEASE_TABLET | Freq: Two times a day (BID) | ORAL | Status: DC

## 2014-02-01 MED ORDER — DSS 100 MG PO CAPS: 100.0000 mg | ORAL_CAPSULE | Freq: Two times a day (BID) | ORAL | Status: AC

## 2014-02-01 NOTE — Progress Notes (Signed)
Clinical Social Work Department CLINICAL SOCIAL WORK PLACEMENT NOTE 02/01/2014  Patient:  Crystal [name], Crystal [name]  Account Number:  [id number] Admit date:  01/17/2014  Clinical Social Worker:  Megan Salon  Date/time:  01/20/2014 02:46 PM  Clinical Social Work is seeking post-discharge placement for this patient at the following level of care:   [place]   (*CSW will update this form in Epic as items are completed)   01/20/2014  Patient/family provided with [place] Department of Clinical Social Work's list of facilities offering this level of care within the geographic area requested by the patient (or if unable, by the patient's family).  01/20/2014  Patient/family informed of their freedom to choose among providers that offer the needed level of care, that participate in Medicare, Medicaid or managed care program needed by the patient, have an available bed and are willing to accept the patient.  01/20/2014  Patient/family informed of [facility]' ownership interest in [facility], as well as of the fact that they are under no obligation to receive care at this facility.  PASARR submitted to EDS on 01/19/2014 PASARR number received from EDS on 01/19/2014  FL2 transmitted to all facilities in geographic area requested by pt/family on  01/20/2014 FL2 transmitted to all facilities within larger geographic area on   Patient informed that his/her managed care company has contracts with or will negotiate with  certain facilities, including the following:     Patient/family informed of bed offers received:  02/01/2014 Patient chooses bed at [facility] Physician recommends and patient chooses bed at    Patient to be transferred to [facility] on  02/01/2014 Patient to be transferred to facility by EMS  The following physician request were entered in Epic:   Additional Comments:  Jeanette Caprice, MSW,  [place]

## 2014-02-01 NOTE — Progress Notes (Signed)
Report given to RN at [facility]. Discharge instructions sent with PT[facility]. [facility] to transport pt to [facility]. Pt states her family made aware.

## 2014-02-01 NOTE — Progress Notes (Addendum)
Clinical Social Worker facilitated patient discharge by contacting the patient and facility, [facility]. Patient states that she already told her son, so no need for social worker to call patient's son. Patient agreeable to this plan and arranging transport via EMS at 1230pm . CSW will sign off, as social work intervention is no longer needed.  Jeanette Caprice, MSW, [place]

## 2014-02-01 NOTE — Progress Notes (Signed)
ANTICOAGULATION CONSULT NOTE - Follow Up Consult  Pharmacy Consult for heparin Indication: pulmonary embolus and DVT  Labs:  Recent Labs  01/30/14 0405 01/31/14 0355 02/01/14 0342  HGB 8.0* 8.2* 9.0*  HCT 25.2* 25.8* 27.8*  PLT 338 345 401*  LABPROT 17.5* 21.0* 24.4*  INR 1.48 1.87* 2.28*  HEPARINUNFRC 0.64 0.52 0.74*  CREATININE  --  0.78  --     Assessment: 80yo female now slightly supratherapeutic on heparin after several levels at goal.  Goal of Therapy:  Heparin level 0.3-0.7 units/ml   Plan:  Will decrease heparin gtt slightly to 1100 units/hr and check level in [place], PharmD, BCPS  02/01/2014,5:31 AM

## 2014-02-01 NOTE — Discharge Summary (Signed)
Physician Discharge Summary  Crystal [name] XVQ:[id number] DOB: 04/19/1925 DOA: 01/17/2014  PCP: Mathews Argyle, MD  Admit date: 01/17/2014 Discharge date: 02/01/2014  Time spent:45 minutes  Recommendations for Outpatient Follow-up:  1. INR check in 2-3days, conservative INR goal of 2-2.5 2. CBC in 1 week  Discharge Diagnoses:  Principal Problem:   Bilateral pulmonary embolism Active Problems:   PE (pulmonary embolism)   DVT (deep venous thrombosis)   Hypotension, unspecified   Upper Gi Bleed   Acute Blood loss anemia   COPD   HTN   H/o Polio with RUE weakness  GERD  Discharge Condition: stable  Diet recommendation: low sodium  Filed Weights   01/18/14 0155  Weight: 88 kg (194 lb 0.1 oz)    History of present illness:  : Crystal [name] is a 80 y.o. female who presents to the ED with SOB. Symptoms have been going on for the past week per patient. She states her insurance sent an RN out to the house who noted increased swelling in her RLE. Was sent in for for [place] but this showed no DVT per patient. She returns to the ED today with ongoing SOB and chest tightness.  In the ED the patient is found to have submassive PE on work up! PCCM consulted, does not feel she should receive lytics at this point, patient put on heparin gtt and hospitalst asked to admit to SDU.   Hospital Course:  80 y.o. female admitted on 01/17/2014 with a past medical history of Hypertension; Arthritis; GERD; Polio; Hyperthyroidism; Peripheral neuropathy; Shingles; IBS; Asthma; Diverticulosis; IBS; and Uterine cancer who presented with a complaint of dyspnea on exertion and extreme fatigue for 1 wk now. She also c/o chest tightness on admission. She was found to have extensive PEs per CT scan  -started on [facility] but developed GIB, s/p EGD, s/p IVC filter, subsequently restarted on anticoagulation   1. Bilateral pulmonary emboli; patient is evaluated by Pulm recommended lifelong AC tx  -echo: LVEF 65%,  reduced RV function; pulmonary HTN; no s/s of fluids overload; monitor off diuretics  -she was on IV heparin --> transitioned to Eliquis 2/17  -2/20: + melena, +occult blood; -->d/c apixaban, started IV heparin due to significant PE;  -s/p EGD no acute bleeding, some friable gastric polyps  -2/23: s/p IVC;  - restarted on anticoagulation with IV heparin due to significant PE DOE, R heart strain; started coumadin 2/25,  -INR finally therapeutic and heparin stopped - if rebleeds significantly may need to reconsider anticoagulation, and palliative care   2. B LE Posterior Tibial V DVT anticoag as noted above  -s/p IVC 2/23; appreciate vascular surgery evaluation, s/p IVC filter   3. Acute blood loss anemia on anticoagulation with GIB; d/c apixaban 2/20;  -Tf sed 1 unit 2/20; monitor Hg, TF prn; no new episodes of bleeding  -s/p EGD: gastric polyps no obvious source for bleeding  -Hb stable since  3. Hypotension with h/o HTN  -resolved  -stable, restart low dose amlodipine if BP trends up  4. COPD No longer smoking , reports cough, cont bronchodilators, added advair, was levofloxacin (d/c on 2/24)   5. GERD/ Diverticulosis  -may need colonoscopy if re bleeds   6. Hypothyroid  -cont Synthroid   7. History of polio with right upper extremity weakness  -PT recommended SNF   Procedures:  IVC filter  EGD  Consultations: Pulm  Discharge Exam: Filed Vitals:   02/01/14 0547  BP: 109/45  Pulse: 60  Temp: 97.8 F (36.6 C)  Resp: 18    General: AAOx3 Cardiovascular: S1S2/RRR Respiratory: CTAB  Discharge Instructions  Discharge Orders   Future Orders Complete By Expires   Diet - low sodium heart healthy  As directed    Increase activity slowly  As directed        Medication List    STOP taking these medications       amLODipine 5 MG tablet  Commonly known as:  NORVASC     benazepril 10 MG tablet  Commonly known as:  LOTENSIN     DAYQUIL PO     doxycycline  100 MG EC tablet  Commonly known as:  DORYX     naproxen sodium 220 MG tablet  Commonly known as:  ANAPROX     omeprazole 20 MG tablet  Commonly known as:  PRILOSEC OTC  Replaced by:  pantoprazole 40 MG tablet      TAKE these medications       albuterol 108 (90 BASE) MCG/ACT inhaler  Commonly known as:  PROVENTIL HFA;VENTOLIN HFA  Inhale 2 puffs into the lungs every 6 (six) hours as needed for wheezing or shortness of breath.     beclomethasone 80 MCG/ACT inhaler  Commonly known as:  QVAR  Inhale 1 puff into the lungs 2 (two) times daily.     cabergoline 0.5 MG tablet  Commonly known as:  DOSTINEX  Take 0.25 mg by mouth See admin instructions. Takes 1 every other week     calcium carbonate 1250 MG tablet  Commonly known as:  OS-CAL - dosed in mg of elemental calcium  Take 1 tablet by mouth 2 (two) times daily.     DSS 100 MG Caps  Take 100 mg by mouth 2 (two) times daily.     Fish Oil 1200 MG Caps  Take 1,200 mg by mouth daily.     gabapentin 400 MG capsule  Commonly known as:  NEURONTIN  Take 400-800 mg by mouth 2 (two) times daily. Take 400mg  in the morning and 800mg  in the evening     guaiFENesin 600 MG 12 hr tablet  Commonly known as:  MUCINEX  Take 600 mg by mouth 2 (two) times daily as needed for cough or to loosen phlegm.     levothyroxine 100 MCG tablet  Commonly known as:  SYNTHROID, LEVOTHROID  Take 100 mcg by mouth daily before breakfast.     mirtazapine 30 MG tablet  Commonly known as:  REMERON  Take 60 mg by mouth at bedtime.     pantoprazole 40 MG tablet  Commonly known as:  PROTONIX  Take 1 tablet (40 mg total) by mouth 2 (two) times daily.     polyethylene glycol packet  Commonly known as:  MIRALAX / GLYCOLAX  Take 17 g by mouth daily as needed.     pramipexole 0.75 MG tablet  Commonly known as:  MIRAPEX  Take 0.75 mg by mouth at bedtime.     ranitidine 300 MG tablet  Commonly known as:  ZANTAC  Take 300 mg by mouth daily as needed for  heartburn.     traZODone 50 MG tablet  Commonly known as:  DESYREL  Take 50 mg by mouth at bedtime as needed for sleep.     vitamin B-12 1000 MCG tablet  Commonly known as:  CYANOCOBALAMIN  Take 1,000 mcg by mouth daily.     VITAMIN D PO  Take 1 tablet by mouth daily.  vitamin E 100 UNIT capsule  Take 100 Units by mouth daily.     warfarin 4 MG tablet  Commonly known as:  COUMADIN  Take 1 tablet (4 mg total) by mouth daily. For INR 2-2.5       Allergies  Allergen Reactions  . Clinoril [Sulindac] Rash  . Erythromycin Rash  . Penicillins Rash  . Prozac [Fluoxetine Hcl] Rash  . Sulfa Antibiotics Rash       Follow-up Information   Follow up with Mathews Argyle, MD. Schedule an appointment as soon as possible for a visit in 1 week.   Specialty:  Internal Medicine   Contact information:   [address] [place] [address] [phone]        The results of significant diagnostics from this hospitalization (including imaging, microbiology, ancillary and laboratory) are listed below for reference.    Significant Diagnostic Studies: Dg Chest 2 View  01/17/2014   CLINICAL DATA:  Shortness of breath  EXAM: CHEST  2 VIEW  COMPARISON:  05/16/2005  FINDINGS: The cardiac shadow is within normal limits. A small hiatal hernia is noted. The lungs are well aerated demonstrate no focal infiltrate or sizable effusion. Degenerative change of thoracic spine is seen.  IMPRESSION: Hiatal hernia.  No other focal abnormality is noted.   Electronically Signed   By: Inez Catalina M.D.   On: 01/17/2014 20:54   Dg Abd 1 View - Kub  01/24/2014   CLINICAL DATA:  IVC filter check.  EXAM: ABDOMEN - 1 VIEW  COMPARISON:  None.  FINDINGS: IVC filter is in place extending from L1-2 to the L3-4 level. There is a large volume of stool present in the colon. No evidence of small bowel obstruction is identified. Postoperative change lower lumbar spine is noted.  IMPRESSION: IVC filter  is in place.  No acute abnormality.   Electronically Signed   By: Inge Rise M.D.   On: 01/24/2014 10:22   Ct Angio Chest Pe W/cm &/or Wo Cm  01/17/2014   CLINICAL DATA:  Dyspnea on exertion, chest pain  EXAM: CT ANGIOGRAPHY CHEST WITH CONTRAST  TECHNIQUE: Multidetector CT imaging of the chest was performed using the standard protocol during bolus administration of intravenous contrast. Multiplanar CT image reconstructions and MIPs were obtained to evaluate the vascular anatomy.  CONTRAST:  56mL OMNIPAQUE IOHEXOL 350 MG/ML SOLN  COMPARISON:  DG CHEST 2 VIEW dated 01/17/2014; DG CHEST 2 VIEW dated 12/25/2001  FINDINGS: There are tubular defects within left and right pulmonary arteries consistent acute pulmonary emboli. Defects extend to the right upper lobe, right lower lobe, left upper lobe, lingula, and left lower lobe. Overall clot burden is moderate. The right ventricular left ventricular volume ratio equals 1.8.  View there is a small focus of airspace disease in the left upper lobe. This could represent in pneumonia or infarction. No pneumothorax.  No axillary supraclavicular lymphadenopathy. No mediastinal hilar lymphadenopathy.  Large hiatal hernia is present. Limited view of the upper abdomen is unremarkable.  Review of the skeleton is unremarkable.  Review of the MIP images confirms the above findings.  IMPRESSION: 1. Bilateral pulmonary thromboemboli emboli involving all of the lung lobes. 2. CT evidence of right heartstrain (RV/LV Ratio = 1.8) consistent with at least submassive (intermediate risk) PE. The presence of right heart strain has been associated with anincreased risk of morbidity and mortality. Consultation with [place] is recommended. 3. Small focus of pneumonia versus in pulmonary infarction in  the left upper lobe. Critical Value/emergent results were called by telephone at the time of interpretation on 01/17/2014 at 9:55 PM to Dr. Elnora Morrison , who  verbally acknowledged these results.   Electronically Signed   By: Suzy Bouchard M.D.   On: 01/17/2014 21:56    Microbiology: No results found for this or any previous visit (from the past 240 hour(s)).   Labs: Basic Metabolic Panel:  Recent Labs Lab 01/31/14 0355  NA 131*  K 4.0  CL 96  CO2 24  GLUCOSE 91  BUN 13  CREATININE 0.78  CALCIUM 8.6   Liver Function Tests: No results found for this basename: AST, ALT, ALKPHOS, BILITOT, PROT, ALBUMIN,  in the last 168 hours No results found for this basename: LIPASE, AMYLASE,  in the last 168 hours No results found for this basename: AMMONIA,  in the last 168 hours CBC:  Recent Labs Lab 01/28/14 0425 01/29/14 0305 01/30/14 0405 01/31/14 0355 02/01/14 0342  WBC 5.0 6.0 5.3 6.4 7.4  HGB 8.0* 8.3* 8.0* 8.2* 9.0*  HCT 25.1* 26.7* 25.2* 25.8* 27.8*  MCV 83.7 83.7 82.1 81.9 81.8  PLT 256 321 338 345 401*   Cardiac Enzymes: No results found for this basename: CKTOTAL, CKMB, CKMBINDEX, TROPONINI,  in the last 168 hours BNP: BNP (last 3 results)  Recent Labs  01/17/14 2002  PROBNP 604.6*   CBG: No results found for this basename: GLUCAP,  in the last 168 hours     Signed:  Matvey Llanas  [facility] 02/01/2014, 11:53 AM

## 2014-08-07 ENCOUNTER — Encounter: Payer: Self-pay | Admitting: *Deleted

## 2014-10-14 ENCOUNTER — Other Ambulatory Visit: Payer: Self-pay | Admitting: Geriatric Medicine

## 2014-10-14 ENCOUNTER — Ambulatory Visit
Admission: RE | Admit: 2014-10-14 | Discharge: 2014-10-14 | Disposition: A | Discharge status: Home / Self Care | Payer: Medicare Other | Source: Ambulatory Visit | Attending: Geriatric Medicine | Admitting: Geriatric Medicine

## 2014-10-14 DIAGNOSIS — M25511 Pain in right shoulder: Secondary | ICD-10-CM

## 2017-04-30 ENCOUNTER — Ambulatory Visit
Admission: RE | Admit: 2017-04-30 | Discharge: 2017-04-30 | Disposition: A | Discharge status: Home / Self Care | Payer: Medicare Other | Source: Ambulatory Visit | Attending: Geriatric Medicine | Admitting: Geriatric Medicine

## 2017-04-30 ENCOUNTER — Other Ambulatory Visit: Payer: Self-pay | Admitting: Geriatric Medicine

## 2017-04-30 DIAGNOSIS — R0600 Dyspnea, unspecified: Secondary | ICD-10-CM

## 2017-04-30 DIAGNOSIS — R0609 Other forms of dyspnea: Principal | ICD-10-CM

## 2017-06-11 ENCOUNTER — Ambulatory Visit
Admission: RE | Admit: 2017-06-11 | Discharge: 2017-06-11 | Disposition: A | Discharge status: Home / Self Care | Payer: Medicare Other | Source: Ambulatory Visit | Attending: Geriatric Medicine | Admitting: Geriatric Medicine

## 2017-06-11 ENCOUNTER — Other Ambulatory Visit: Payer: Self-pay | Admitting: Geriatric Medicine

## 2017-06-11 DIAGNOSIS — R9389 Abnormal findings on diagnostic imaging of other specified body structures: Secondary | ICD-10-CM

## 2017-06-26 ENCOUNTER — Emergency Department (HOSPITAL_COMMUNITY): Payer: Medicare Other

## 2017-06-26 ENCOUNTER — Encounter (HOSPITAL_COMMUNITY): Payer: Self-pay | Admitting: Emergency Medicine

## 2017-06-26 ENCOUNTER — Emergency Department (HOSPITAL_COMMUNITY)
Admission: EM | Admit: 2017-06-26 | Discharge: 2017-06-27 | Disposition: A | Discharge status: Home / Self Care | Payer: Medicare Other | Attending: Emergency Medicine | Admitting: Emergency Medicine

## 2017-06-26 DIAGNOSIS — Z79899 Other long term (current) drug therapy: Secondary | ICD-10-CM | POA: Diagnosis not present

## 2017-06-26 DIAGNOSIS — J45909 Unspecified asthma, uncomplicated: Secondary | ICD-10-CM | POA: Diagnosis not present

## 2017-06-26 DIAGNOSIS — R0602 Shortness of breath: Secondary | ICD-10-CM

## 2017-06-26 DIAGNOSIS — Z96659 Presence of unspecified artificial knee joint: Secondary | ICD-10-CM | POA: Insufficient documentation

## 2017-06-26 DIAGNOSIS — Z87891 Personal history of nicotine dependence: Secondary | ICD-10-CM | POA: Diagnosis not present

## 2017-06-26 DIAGNOSIS — I10 Essential (primary) hypertension: Secondary | ICD-10-CM | POA: Diagnosis not present

## 2017-06-26 DIAGNOSIS — I441 Atrioventricular block, second degree: Secondary | ICD-10-CM | POA: Diagnosis not present

## 2017-06-26 DIAGNOSIS — Z7901 Long term (current) use of anticoagulants: Secondary | ICD-10-CM | POA: Diagnosis not present

## 2017-06-26 LAB — HEPATIC FUNCTION PANEL
ALBUMIN: 3.8 g/dL (ref 3.5–5.0)
ALT: 15 U/L (ref 14–54)
AST: 21 U/L (ref 15–41)
Alkaline Phosphatase: 77 U/L (ref 38–126)
Bilirubin, Direct: 0.1 mg/dL (ref 0.1–0.5)
Indirect Bilirubin: 0.3 mg/dL (ref 0.3–0.9)
Total Bilirubin: 0.4 mg/dL (ref 0.3–1.2)
Total Protein: 6.5 g/dL (ref 6.5–8.1)

## 2017-06-26 LAB — PROTIME-INR
INR: 1.97
Prothrombin Time: 22.8 seconds — ABNORMAL HIGH (ref 11.4–15.2)

## 2017-06-26 LAB — BASIC METABOLIC PANEL
ANION GAP: 7 (ref 5–15)
BUN: 15 mg/dL (ref 6–20)
CALCIUM: 9 mg/dL (ref 8.9–10.3)
CO2: 29 mmol/L (ref 22–32)
Chloride: 93 mmol/L — ABNORMAL LOW (ref 101–111)
Creatinine, Ser: 0.98 mg/dL (ref 0.44–1.00)
GFR calc Af Amer: 58 mL/min — ABNORMAL LOW (ref >60)
GFR calc non Af Amer: 50 mL/min — ABNORMAL LOW (ref >60)
Glucose, Bld: 112 mg/dL — ABNORMAL HIGH (ref 65–99)
Potassium: 4 mmol/L (ref 3.5–5.1)
Sodium: 129 mmol/L — ABNORMAL LOW (ref 135–145)

## 2017-06-26 LAB — CBC
HCT: 38.3 % (ref 36.0–46.0)
Hemoglobin: 12.7 g/dL (ref 12.0–15.0)
MCH: 30.9 pg (ref 26.0–34.0)
MCHC: 33.2 g/dL (ref 30.0–36.0)
MCV: 93.2 fL (ref 78.0–100.0)
PLATELETS: 274 10*3/uL (ref 150–400)
RBC: 4.11 MIL/uL (ref 3.87–5.11)
RDW: 13.5 % (ref 11.5–15.5)
WBC: 5.9 10*3/uL (ref 4.0–10.5)

## 2017-06-26 LAB — I-STAT TROPONIN, ED
Troponin i, poc: 0 ng/mL (ref 0.00–0.08)
Troponin i, poc: 0.01 ng/mL (ref 0.00–0.08)

## 2017-06-26 LAB — BRAIN NATRIURETIC PEPTIDE: B Natriuretic Peptide: 46.9 pg/mL (ref 0.0–100.0)

## 2017-06-26 LAB — LIPASE, BLOOD: Lipase: 45 U/L (ref 11–51)

## 2017-06-26 MED ORDER — ALBUTEROL SULFATE (2.5 MG/3ML) 0.083% IN NEBU
5.0000 mg | INHALATION_SOLUTION | Freq: Once | RESPIRATORY_TRACT | Status: AC
  Administered 2017-06-26: 5 mg via RESPIRATORY_TRACT
  Filled 2017-06-26: qty 6

## 2017-06-26 MED ORDER — IOPAMIDOL (ISOVUE-370) INJECTION 76%
100.0000 mL | Freq: Once | INTRAVENOUS | Status: AC | PRN
  Administered 2017-06-26: 100 mL via INTRAVENOUS

## 2017-06-26 NOTE — ED Provider Notes (Signed)
[facility] DEPT Provider Note   CSN: [id number] Arrival date & time: 06/26/17  1942     History   Chief Complaint Chief Complaint  Patient presents with  . Shortness of Breath    HPI Crystal [name] is a 83 y.o. female.  83 year old female with past medical history including PE on anticoagulation and with IVC filter, hypertension who presents with shortness of breath. This afternoon, She began feeling short of breath when she walked back from the bathroom at home. She sat down and the SOB got worse. No associated chest pain. She currently has mild shortness of breath at rest. She reports associated diaphoresis, nausea and vomiting. No lightheadedness. She reports recent b/l LE edema. She has chronic pain in b/l legs. No fevers, cough/cold symptoms. She has had urinary symptoms recently and finished a course of cipro yesterday. For the past 2 weeks she has had home physical therapy but has had difficulty tolerating the exercise due to fatigue.     Shortness of Breath     Past Medical History:  Diagnosis Date  . Allergic sinusitis   . Arthritis   . Asthma   . Benign tumor of pituitary gland ([place]) 1999  . Cancer ([place])   . Diverticulosis   . GERD (gastroesophageal reflux disease)   . Hypertension   . Hyperthyroidism   . IBS (irritable bowel syndrome)   . IBS (irritable bowel syndrome)   . Peripheral neuropathy   . Polio   . Shingles   . Uterine cancer [facility])     Patient Active Problem List   Diagnosis Date Noted  . DVT (deep venous thrombosis) ([place]) 01/18/2014  . Hypotension, unspecified 01/18/2014  . Bilateral pulmonary embolism ([place]) 01/17/2014  . PE (pulmonary embolism) 01/17/2014    Past Surgical History:  Procedure Laterality Date  . CARPAL TUNNEL RELEASE Left   . CATARACT EXTRACTION Right   . CERVICAL POLYPECTOMY    . ESOPHAGOGASTRODUODENOSCOPY N/A 01/22/2014   Procedure: ESOPHAGOGASTRODUODENOSCOPY (EGD);  Surgeon: Wonda Horner, MD;  Location: [facility]  ENDOSCOPY;  Service: Endoscopy;  Laterality: N/A;  . REPLACEMENT TOTAL KNEE    . TONSILLECTOMY    . TOTAL SHOULDER REPLACEMENT    . VENA CAVA FILTER PLACEMENT Right 01/24/2014   Procedure: INSERTION VENA-CAVA FILTER; ULTRASOUND GUIDED;  Surgeon: Rosetta Posner, MD;  Location: [facility] OR;  Service: Vascular;  Laterality: Right;    OB History    No data available       Home Medications    Prior to Admission medications   Medication Sig Start Date End Date Taking? Authorizing Provider  beclomethasone (QVAR) 80 MCG/ACT inhaler Inhale 1 puff into the lungs 2 (two) times daily.   Yes [provider]  benazepril (LOTENSIN) 10 MG tablet Take 10 mg by mouth daily. 05/04/17  Yes [provider]  cabergoline (DOSTINEX) 0.5 MG tablet Take 0.25 mg by mouth See admin instructions. Takes 1 every month   Yes [provider]  Cholecalciferol (VITAMIN D) 2000 units tablet Take 2,000 Units by mouth daily.    Yes [provider]  ferrous sulfate 325 (65 FE) MG tablet Take 325 mg by mouth daily with breakfast.   Yes [provider]  FLOVENT HFA 110 MCG/ACT inhaler Inhale 2 puffs into the lungs 2 (two) times daily as needed for wheezing. 05/24/17  Yes [provider]  furosemide (LASIX) 20 MG tablet Take 20 mg by mouth daily. 06/05/17  Yes [provider]  gabapentin (NEURONTIN) 400 MG  capsule Take 400 mg by mouth 3 (three) times daily. 06/05/17  Yes [provider]  latanoprost (XALATAN) 0.005 % ophthalmic solution Place 1 drop into both eyes at bedtime. 06/17/17  Yes [provider]  levothyroxine (SYNTHROID, LEVOTHROID) 75 MCG tablet Take 75 mcg by mouth daily before breakfast.    Yes [provider]  LORazepam (ATIVAN) 0.5 MG tablet Take 0.5 mg by mouth at bedtime as needed for sleep. 06/09/17  Yes [provider]  mirtazapine (REMERON) 30 MG tablet Take 30 mg by mouth at bedtime.    Yes [provider]  Omega-3  Fatty Acids (FISH OIL) 1000 MG CAPS Take 1,000 mg by mouth daily.    Yes [provider]  omeprazole (PRILOSEC) 20 MG capsule Take 20 mg by mouth daily. 05/19/17  Yes [provider]  pramipexole (MIRAPEX) 0.75 MG tablet Take 0.75 mg by mouth at bedtime.   Yes [provider]  ranitidine (ZANTAC) 300 MG tablet Take 300 mg by mouth daily as needed for heartburn.   Yes [provider]  traZODone (DESYREL) 50 MG tablet Take 50 mg by mouth at bedtime as needed for sleep.   Yes [provider]  Vitamin D, Ergocalciferol, (DRISDOL) 50000 units CAPS capsule Take 50,000 Units by mouth once a week. 04/28/17  Yes [provider]  warfarin (COUMADIN) 3 MG tablet Take 3 mg by mouth daily.   Yes [provider]  warfarin (COUMADIN) 4 MG tablet Take 1 tablet (4 mg total) by mouth daily. For INR 2-2.5 02/01/14  Yes Domenic Polite, MD  zolpidem (AMBIEN) 10 MG tablet Take 10 mg by mouth at bedtime. 05/27/17  Yes [provider]  docusate sodium 100 MG CAPS Take 100 mg by mouth 2 (two) times daily. Patient not taking: Reported on 06/26/2017 02/01/14   Domenic Polite, MD  pantoprazole (PROTONIX) 40 MG tablet Take 1 tablet (40 mg total) by mouth 2 (two) times daily. Patient not taking: Reported on 06/26/2017 02/01/14   Domenic Polite, MD    Family History History reviewed. No pertinent family history.  Social History Social History  Substance Use Topics  . Smoking status: Former Research scientist (life sciences)  . Smokeless tobacco: Not on file  . Alcohol use No     Allergies   Clinoril [sulindac]; Erythromycin; Penicillins; Prozac [fluoxetine hcl]; and Sulfa antibiotics   Review of Systems Review of Systems  Respiratory: Positive for shortness of breath.   All other systems reviewed and are negative except that which was mentioned in HPI    Physical Exam Updated Vital Signs BP 102/64   Pulse 79   Temp 98.2 F (36.8 C) (Oral)   Resp (!) 21   Ht 5\' 5"   (1.651 m)   Wt 90.7 kg (200 lb)   SpO2 92%   BMI 33.28 kg/m   Physical Exam  Constitutional: She is oriented to person, place, and time. She appears well-developed and well-nourished. No distress.  HENT:  Head: Normocephalic and atraumatic.  Moist mucous membranes  Eyes: Pupils are equal, round, and reactive to light. Conjunctivae are normal.  Neck: Neck supple.  Cardiovascular: Normal rate and normal heart sounds.   No murmur heard. Occasional irregular beat  Pulmonary/Chest: Effort normal and breath sounds normal.  Abdominal: Soft. Bowel sounds are normal. She exhibits no distension. There is no tenderness.  Musculoskeletal: She exhibits edema (1+ pitting edema b/l ankles R>L).  Neurological: She is alert and oriented to person, place, and time.  Fluent speech  Skin: Skin is warm and dry.  Psychiatric: She has a normal mood and affect. Judgment normal.  Nursing note and vitals reviewed.    ED Treatments / Results  Labs (all labs ordered are listed, but only abnormal results are displayed) Labs Reviewed  BASIC METABOLIC PANEL - Abnormal; Notable for the following:       Result Value   Sodium 129 (*)    Chloride 93 (*)    Glucose, Bld 112 (*)    GFR calc non Af Amer 50 (*)    GFR calc Af Amer 58 (*)    All other components within normal limits  PROTIME-INR - Abnormal; Notable for the following:    Prothrombin Time 22.8 (*)    All other components within normal limits  URINALYSIS, ROUTINE W REFLEX MICROSCOPIC - Abnormal; Notable for the following:    Specific Gravity, Urine >1.046 (*)    All other components within normal limits  BRAIN NATRIURETIC PEPTIDE  CBC  HEPATIC FUNCTION PANEL  LIPASE, BLOOD  I-STAT TROPONIN, ED  I-STAT TROPONIN, ED    EKG  EKG Interpretation  Date/Time:  Thursday June 26 2017 19:53:58 EDT Ventricular Rate:  77 PR Interval:    QRS Duration: 82 QT Interval:  356 QTC Calculation: 402 R Axis:   -14 Text Interpretation:  ** Critical  Test Result: AV Block Sinus rhythm with 2nd degree A-V block (Mobitz I) with Premature supraventricular complexes Anterolateral infarct , age undetermined Abnormal ECG new second degree Mobitz I block compared to previous ekg Confirmed by Theotis Burrow [phone]) on 06/26/2017 7:59:10 PM       Radiology Dg Chest 2 View  Result Date: 06/26/2017 CLINICAL DATA:  Shortness of breath and nausea EXAM: CHEST  2 VIEW COMPARISON:  06/11/2017 FINDINGS: Mildly low lung volumes. Mild cardiomegaly. No focal infiltrate or effusion. Moderate hiatal hernia. Atherosclerosis of the aorta. No pneumothorax. IMPRESSION: 1. Low lung volumes.  No edema or infiltrate 2. Mild cardiomegaly 3. Mild to moderate hiatal hernia Electronically Signed   By: Donavan Foil M.D.   On: 06/26/2017 20:43   Ct Angio Chest Pe W/cm &/or Wo Cm  Result Date: 06/26/2017 CLINICAL DATA:  Shortness of breath EXAM: CT ANGIOGRAPHY CHEST WITH CONTRAST TECHNIQUE: Multidetector CT imaging of the chest was performed using the standard protocol during bolus administration of intravenous contrast. Multiplanar CT image reconstructions and MIPs were obtained to evaluate the vascular anatomy. CONTRAST:  100 mL Isovue 370. COMPARISON:  01/17/2014 FINDINGS: Cardiovascular: Thoracic aorta shows atherosclerotic calcifications without aneurysmal dilatation. No definitive dissection is seen. No cardiac enlargement is noted. Scattered coronary calcifications are seen. Pulmonary artery is within normal limits without evidence of filling defect to suggest pulmonary embolism. Mediastinum/Nodes: The thoracic inlet is within normal limits. No hilar or mediastinal adenopathy is identified. Proximal esophagus is within normal limits. Moderate hiatal hernia is seen. Lungs/Pleura: Lungs are well aerated bilaterally. Previously seen density in the left upper lobe has improved somewhat although some residual ground-glass density is noted. Stable apical nodule is noted in the left  upper lobe. No other focal infiltrate or sizable effusion is seen. Upper Abdomen: Visualized upper abdomen is within normal limits. Musculoskeletal: Degenerative changes of the thoracic spine are noted. No acute bony abnormality is seen. Review of the MIP images confirms the above findings. IMPRESSION: Stable moderate-sized hiatal hernia. No evidence of pulmonary emboli. Persistent density in the left upper lobe with a less nodular component likely representing some chronic postinflammatory change. The lack of significant interval  growth suggests a more benign etiology. Aortic Atherosclerosis (ICD10-I70.0). Electronically Signed   By: Inez Catalina M.D.   On: 06/26/2017 22:23    Procedures Procedures (including critical care time)  Medications Ordered in ED Medications  albuterol (PROVENTIL) (2.5 MG/3ML) 0.083% nebulizer solution 5 mg (5 mg Nebulization Given 06/26/17 2011)  iopamidol (ISOVUE-370) 76 % injection 100 mL (100 mLs Intravenous Contrast Given 06/26/17 2201)     Initial Impression / Assessment and Plan / ED Course  I have reviewed the triage vital signs and the nursing notes.  Pertinent labs & imaging results that were available during my care of the patient were reviewed by me and considered in my medical decision making (see chart for details).    Pt w/ SOB That began this afternoon after she had walked to the bathroom and got worse even after resting. On my exam, she was breathing comfortably on room air, vital signs reassuring. No wheezing or abnormal lung sounds. Her EKG showed a second-degree heart block, Mobitz 1. Chest x-ray negative acute. Lab work including serial troponins was unremarkable, INR 1.97 and near therapeutic. No evidence of infection by UA or CBC. Because of her history of PE, I did recommend a CTA of chest given the relatively sudden onset of her symptoms. CTA was negative for PE. Because of patient's well appearance and reassuring workup, I feel she is safe for  discharge home particularly because she has had no chest or abdominal pain to suggest cardiac etiology. She does give some symptoms that suggest deconditioning, as she describes difficulty with fatigue during physical therapy. I have recommended a PCP follow-up for this. I also recommended cardiology follow-up for her second-degree heart block. I do not see any evidence of Mobitz II on EKG. I have extensively reviewed return precautions with the patient and she has voiced understanding. Patient discharged in satisfactory condition.  Final Clinical Impressions(s) / ED Diagnoses   Final diagnoses:  SOB (shortness of breath)  Second degree atrioventricular block, Mobitz (type) I    New Prescriptions New Prescriptions   No medications on file     Little, Wenda Over[place], MD 06/27/17 [phone]

## 2017-06-26 NOTE — ED Triage Notes (Signed)
Per EMS: Pt c/o N/V and non exertional SHOB x 1-2 hours.  EMS states pt has a hx of PE's and is currently prescribed warfarin. Pt was given 4 mg of Zofran odt prior to arrival.  PTA vitals: 144/98 BP, O2 93% on RA.

## 2017-06-26 NOTE — ED Notes (Signed)
Patient transported to CT 

## 2017-06-27 LAB — URINALYSIS, ROUTINE W REFLEX MICROSCOPIC
BILIRUBIN URINE: NEGATIVE
GLUCOSE, UA: NEGATIVE mg/dL
HGB URINE DIPSTICK: NEGATIVE
KETONES UR: NEGATIVE mg/dL
Leukocytes, UA: NEGATIVE
Nitrite: NEGATIVE
Protein, ur: NEGATIVE mg/dL
Specific Gravity, Urine: >1.046 — ABNORMAL HIGH (ref 1.005–1.030)
pH: 5 (ref 5.0–8.0)

## 2017-06-27 NOTE — ED Notes (Signed)
Dr. Little at bedside.  

## 2017-06-27 NOTE — Discharge Instructions (Signed)
Return to ER immediately if you have any chest pain, worsening shortness of breath, fever, or leg swelling/pain.

## 2017-06-27 NOTE — ED Notes (Signed)
Pt stable, family at bedside, states understanding of discharge instructions 

## 2017-07-25 ENCOUNTER — Encounter: Payer: Self-pay | Admitting: Interventional Cardiology

## 2017-08-13 DIAGNOSIS — R0600 Dyspnea, unspecified: Secondary | ICD-10-CM | POA: Insufficient documentation

## 2017-08-13 NOTE — Progress Notes (Deleted)
Cardiology Office Note    Date:  08/13/2017   ID:  BRECKIN ZAFAR, DOB 30-Sep-1925, MRN [id number]  PCP:  Lajean Manes, MD  Cardiologist: Sinclair Grooms, MD   No chief complaint on file.   History of Present Illness:  Crystal [name] is a 83 y.o. female  patient referred for evaluation of dyspnea on exertion and has prior h/o pulmonary emboli.    Past Medical History:  Diagnosis Date  . Allergic sinusitis   . Arthritis   . Asthma   . Benign tumor of pituitary gland ([place]) 1999  . Cancer ([place])   . Diverticulosis   . GERD (gastroesophageal reflux disease)   . Hypertension   . Hyperthyroidism   . IBS (irritable bowel syndrome)   . IBS (irritable bowel syndrome)   . Peripheral neuropathy   . Polio   . Shingles   . Uterine cancer [facility])     Past Surgical History:  Procedure Laterality Date  . CARPAL TUNNEL RELEASE Left   . CATARACT EXTRACTION Right   . CERVICAL POLYPECTOMY    . ESOPHAGOGASTRODUODENOSCOPY N/A 01/22/2014   Procedure: ESOPHAGOGASTRODUODENOSCOPY (EGD);  Surgeon: Wonda Horner, MD;  Location: [facility] ENDOSCOPY;  Service: Endoscopy;  Laterality: N/A;  . REPLACEMENT TOTAL KNEE    . TONSILLECTOMY    . TOTAL SHOULDER REPLACEMENT    . VENA CAVA FILTER PLACEMENT Right 01/24/2014   Procedure: INSERTION VENA-CAVA FILTER; ULTRASOUND GUIDED;  Surgeon: Rosetta Posner, MD;  Location: [facility] OR;  Service: Vascular;  Laterality: Right;    Current Medications: Outpatient Medications Prior to Visit  Medication Sig Dispense Refill  . beclomethasone (QVAR) 80 MCG/ACT inhaler Inhale 1 puff into the lungs 2 (two) times daily.    . benazepril (LOTENSIN) 10 MG tablet Take 10 mg by mouth daily.  11  . cabergoline (DOSTINEX) 0.5 MG tablet Take 0.25 mg by mouth See admin instructions. Takes 1 every month    . Cholecalciferol (VITAMIN D) 2000 units tablet Take 2,000 Units by mouth daily.     Marland Kitchen docusate sodium 100 MG CAPS Take 100 mg by mouth 2 (two) times daily. (Patient not  taking: Reported on 06/26/2017) 10 capsule 0  . ferrous sulfate 325 (65 FE) MG tablet Take 325 mg by mouth daily with breakfast.    . FLOVENT HFA 110 MCG/ACT inhaler Inhale 2 puffs into the lungs 2 (two) times daily as needed for wheezing.  2  . furosemide (LASIX) 20 MG tablet Take 20 mg by mouth daily.  9  . gabapentin (NEURONTIN) 400 MG capsule Take 400 mg by mouth 3 (three) times daily.  5  . latanoprost (XALATAN) 0.005 % ophthalmic solution Place 1 drop into both eyes at bedtime.  99  . levothyroxine (SYNTHROID, LEVOTHROID) 75 MCG tablet Take 75 mcg by mouth daily before breakfast.     . LORazepam (ATIVAN) 0.5 MG tablet Take 0.5 mg by mouth at bedtime as needed for sleep.  1  . mirtazapine (REMERON) 30 MG tablet Take 30 mg by mouth at bedtime.     . Omega-3 Fatty Acids (FISH OIL) 1000 MG CAPS Take 1,000 mg by mouth daily.     Marland Kitchen omeprazole (PRILOSEC) 20 MG capsule Take 20 mg by mouth daily.  3  . pantoprazole (PROTONIX) 40 MG tablet Take 1 tablet (40 mg total) by mouth 2 (two) times daily. (Patient not taking: Reported on 06/26/2017)    . pramipexole (MIRAPEX) 0.75 MG tablet Take 0.75 mg by mouth  at bedtime.    . ranitidine (ZANTAC) 300 MG tablet Take 300 mg by mouth daily as needed for heartburn.    . traZODone (DESYREL) 50 MG tablet Take 50 mg by mouth at bedtime as needed for sleep.    . Vitamin D, Ergocalciferol, (DRISDOL) 50000 units CAPS capsule Take 50,000 Units by mouth once a week.  4  . warfarin (COUMADIN) 3 MG tablet Take 3 mg by mouth daily.    Marland Kitchen warfarin (COUMADIN) 4 MG tablet Take 1 tablet (4 mg total) by mouth daily. For INR 2-2.5    . zolpidem (AMBIEN) 10 MG tablet Take 10 mg by mouth at bedtime.  1   No facility-administered medications prior to visit.      Allergies:   Clinoril [sulindac]; Erythromycin; Penicillins; Prozac [fluoxetine hcl]; and Sulfa antibiotics   Social History   Social History  . Marital status: Widowed    Spouse name: N/A  . Number of children: N/A   . Years of education: N/A   Social History Main Topics  . Smoking status: Former Research scientist (life sciences)  . Smokeless tobacco: Never Used  . Alcohol use No  . Drug use: No  . Sexual activity: Not on file   Other Topics Concern  . Not on file   Social History Narrative  . No narrative on file     Family History:  The patient's ***family history is not on file.   ROS:   Please see the history of present illness.    ***  All other systems reviewed and are negative.   PHYSICAL EXAM:   VS:  There were no vitals taken for this visit.   GEN: Well nourished, well developed, in no acute distress  HEENT: normal  Neck: no JVD, carotid bruits, or masses Cardiac: ***RRR; no murmurs, rubs, or gallops,no edema  Respiratory:  clear to auscultation bilaterally, normal work of breathing GI: soft, nontender, nondistended, + BS MS: no deformity or atrophy  Skin: warm and dry, no rash Neuro:  Alert and Oriented x 3, Strength and sensation are intact Psych: euthymic mood, full affect  Wt Readings from Last 3 Encounters:  06/26/17 200 lb (90.7 kg)  01/18/14 194 lb 0.1 oz (88 kg)      Studies/Labs Reviewed:   EKG:  EKG  ***  Recent Labs: 06/26/2017: ALT 15; B Natriuretic Peptide 46.9; BUN 15; Creatinine, Ser 0.98; Hemoglobin 12.7; Platelets 274; Potassium 4.0; Sodium 129   Lipid Panel No results found for: CHOL, TRIG, HDL, CHOLHDL, VLDL, LDLCALC, LDLDIRECT  Additional studies/ records that were reviewed today include:  ***    ASSESSMENT:    1. Bilateral pulmonary embolism (HCC)      PLAN:  In order of problems listed above:  1. ***    Medication Adjustments/Labs and Tests Ordered: Current medicines are reviewed at length with the patient today.  Concerns regarding medicines are outlined above.  Medication changes, Labs and Tests ordered today are listed in the Patient Instructions below. There are no Patient Instructions on file for this visit.   Signed, Sinclair Grooms, MD    08/13/2017 7:24 PM    [address], [place], [place]  [address] Phone: [phone]; Fax: [phone]

## 2017-08-14 ENCOUNTER — Ambulatory Visit: Payer: Medicare Other | Admitting: Interventional Cardiology

## 2017-09-25 ENCOUNTER — Encounter (INDEPENDENT_AMBULATORY_CARE_PROVIDER_SITE_OTHER): Payer: Self-pay

## 2017-09-25 ENCOUNTER — Encounter: Payer: Self-pay | Admitting: Interventional Cardiology

## 2017-09-25 ENCOUNTER — Ambulatory Visit (INDEPENDENT_AMBULATORY_CARE_PROVIDER_SITE_OTHER): Payer: Medicare Other | Admitting: Interventional Cardiology

## 2017-09-25 VITALS — BP 136/68 | HR 87 | Ht 65.0 in | Wt 157.8 lb

## 2017-09-25 DIAGNOSIS — I2510 Atherosclerotic heart disease of native coronary artery without angina pectoris: Secondary | ICD-10-CM | POA: Diagnosis not present

## 2017-09-25 DIAGNOSIS — I82409 Acute embolism and thrombosis of unspecified deep veins of unspecified lower extremity: Secondary | ICD-10-CM

## 2017-09-25 DIAGNOSIS — R0600 Dyspnea, unspecified: Secondary | ICD-10-CM

## 2017-09-25 DIAGNOSIS — I2699 Other pulmonary embolism without acute cor pulmonale: Secondary | ICD-10-CM | POA: Diagnosis not present

## 2017-09-25 DIAGNOSIS — I441 Atrioventricular block, second degree: Secondary | ICD-10-CM | POA: Insufficient documentation

## 2017-09-25 NOTE — Patient Instructions (Signed)
Medication Instructions:  Your physician recommends that you continue on your current medications as directed. Please refer to the Current Medication list given to you today.  Labwork: None  Testing/Procedures: Your physician has recommended that you wear a 48 hour holter monitor. Holter monitors are medical devices that record the heart's electrical activity. Doctors most often use these monitors to diagnose arrhythmias. Arrhythmias are problems with the speed or rhythm of the heartbeat. The monitor is a small, portable device. You can wear one while you do your normal daily activities. This is usually used to diagnose what is causing palpitations/syncope (passing out).    Follow-Up: Your physician recommends that you schedule a follow-up appointment as needed with Dr. Smith.   Any Other Special Instructions Will Be Listed Below (If Applicable).     If you need a refill on your cardiac medications before your next appointment, please call your pharmacy.   

## 2017-09-25 NOTE — Progress Notes (Signed)
Cardiology Office Note    Date:  09/25/2017   ID:  SLOAN GALENTINE, DOB [birth year], MRN [id number]  PCP:  Lajean Manes, MD  Cardiologist: Sinclair Grooms, MD   No chief complaint on file.   History of Present Illness:  Crystal [name] is a 83 y.o. female being seen at the request of Dr. Lajean Manes for evaluation of shortness of breath.  Recent emergency room visit because of shortness of breath. She has a prior history of pulmonary emboli. Per the ER visit there was apparently some evidence of Mobitz 1 second-degree heart block.  The emergency room visit did not demonstrate evidence of PE. The patient's major concern was that she had recurrent PE. She has prior history and also has an inferior vena caval umbrella. She is on chronic Coumadin therapy. The shortness of breath was not associated with chest discomfort. She has variable threshold shortness of breath. Certain activities around her house can precipitate the shortness of breath on one occasion but on other occasions can be tolerated without difficulty. She has not had lightheadedness, syncope, or near-syncope.  Past Medical History:  Diagnosis Date  . Allergic sinusitis   . Arthritis   . Asthma   . Benign tumor of pituitary gland ([place]) 1999  . Cancer ([place])   . Diverticulosis   . GERD (gastroesophageal reflux disease)   . Hypertension   . Hyperthyroidism   . IBS (irritable bowel syndrome)   . IBS (irritable bowel syndrome)   . Peripheral neuropathy   . Polio   . Shingles   . Uterine cancer [facility])     Past Surgical History:  Procedure Laterality Date  . CARPAL TUNNEL RELEASE Left   . CATARACT EXTRACTION Right   . CERVICAL POLYPECTOMY    . ESOPHAGOGASTRODUODENOSCOPY N/A 01/22/2014   Procedure: ESOPHAGOGASTRODUODENOSCOPY (EGD);  Surgeon: Wonda Horner, MD;  Location: [facility] ENDOSCOPY;  Service: Endoscopy;  Laterality: N/A;  . REPLACEMENT TOTAL KNEE    . TONSILLECTOMY    . TOTAL SHOULDER REPLACEMENT    .  VENA CAVA FILTER PLACEMENT Right 01/24/2014   Procedure: INSERTION VENA-CAVA FILTER; ULTRASOUND GUIDED;  Surgeon: Rosetta Posner, MD;  Location: [facility] OR;  Service: Vascular;  Laterality: Right;    Current Medications: Outpatient Medications Prior to Visit  Medication Sig Dispense Refill  . beclomethasone (QVAR) 80 MCG/ACT inhaler Inhale 1 puff into the lungs 2 (two) times daily.    . benazepril (LOTENSIN) 10 MG tablet Take 10 mg by mouth daily.  11  . cabergoline (DOSTINEX) 0.5 MG tablet Take 0.25 mg by mouth See admin instructions. Takes 1 every month    . Cholecalciferol (VITAMIN D) 2000 units tablet Take 2,000 Units by mouth daily.     Marland Kitchen docusate sodium 100 MG CAPS Take 100 mg by mouth 2 (two) times daily. 10 capsule 0  . ferrous sulfate 325 (65 FE) MG tablet Take 325 mg by mouth daily with breakfast.    . FLOVENT HFA 110 MCG/ACT inhaler Inhale 2 puffs into the lungs 2 (two) times daily as needed for wheezing.  2  . furosemide (LASIX) 20 MG tablet Take 20 mg by mouth daily.  9  . gabapentin (NEURONTIN) 400 MG capsule Take 400 mg by mouth 3 (three) times daily.  5  . latanoprost (XALATAN) 0.005 % ophthalmic solution Place 1 drop into both eyes at bedtime.  99  . levothyroxine (SYNTHROID, LEVOTHROID) 75 MCG tablet Take 75 mcg by mouth daily before breakfast.     .  LORazepam (ATIVAN) 0.5 MG tablet Take 0.5 mg by mouth at bedtime as needed for sleep.  1  . mirtazapine (REMERON) 30 MG tablet Take 30 mg by mouth at bedtime.     . Omega-3 Fatty Acids (FISH OIL) 1000 MG CAPS Take 1,000 mg by mouth daily.     Marland Kitchen omeprazole (PRILOSEC) 20 MG capsule Take 20 mg by mouth daily.  3  . pramipexole (MIRAPEX) 0.75 MG tablet Take 0.75 mg by mouth at bedtime.    . ranitidine (ZANTAC) 300 MG tablet Take 300 mg by mouth daily as needed for heartburn.    . traZODone (DESYREL) 50 MG tablet Take 50 mg by mouth at bedtime as needed for sleep.    . Vitamin D, Ergocalciferol, (DRISDOL) 50000 units CAPS capsule Take  50,000 Units by mouth once a week.  4  . warfarin (COUMADIN) 3 MG tablet Take 3 mg by mouth daily.    Marland Kitchen warfarin (COUMADIN) 4 MG tablet Take 1 tablet (4 mg total) by mouth daily. For INR 2-2.5    . zolpidem (AMBIEN) 10 MG tablet Take 10 mg by mouth at bedtime.  1  . pantoprazole (PROTONIX) 40 MG tablet Take 1 tablet (40 mg total) by mouth 2 (two) times daily. (Patient not taking: Reported on 06/26/2017)     No facility-administered medications prior to visit.      Allergies:   Clinoril [sulindac]; Erythromycin; Penicillins; Prozac [fluoxetine hcl]; and Sulfa antibiotics   Social History   Social History  . Marital status: Widowed    Spouse name: N/A  . Number of children: N/A  . Years of education: N/A   Social History Main Topics  . Smoking status: Former Research scientist (life sciences)  . Smokeless tobacco: Never Used  . Alcohol use No  . Drug use: No  . Sexual activity: Not Asked   Other Topics Concern  . None   Social History Narrative  . None     Family History:  The patient's family history includes Breast cancer in her mother; Cancer in her mother; Heart disease in her father.   ROS:   Please see the history of present illness.    She denies angina. Shortness of breath has been a limiting issue for her. Chronic lower extremity swelling. Chronic lower extremity pain. Difficulty with constipation and balance.  All other systems reviewed and are negative.   PHYSICAL EXAM:   VS:  BP 136/68 (BP Location: Left Arm)   Pulse 87   Ht 5\' 5"  (1.651 m)   Wt 157 lb 12.8 oz (71.6 kg)   BMI 26.26 kg/m    GEN: Well nourished, well developed, in no acute distress  HEENT: normal  Neck: no JVD, carotid bruits, or masses Cardiac: RRR; no murmurs, rubs, or gallops,no edema  Respiratory:  clear to auscultation bilaterally, normal work of breathing GI: soft, nontender, nondistended, + BS MS: no deformity or atrophy  Skin: warm and dry, no rash Neuro:  Alert and Oriented x 3, Strength and sensation are  intact Psych: euthymic mood, full affect  Wt Readings from Last 3 Encounters:  09/25/17 157 lb 12.8 oz (71.6 kg)  06/26/17 200 lb (90.7 kg)  01/18/14 194 lb 0.1 oz (88 kg)      Studies/Labs Reviewed:   EKG:  EKG  Performed on 06/26/2017 reveals sinus rhythm, PACs, and suspicion of Mobitz 1 second-degree heart block. No acute ischemic changes were noted.  Recent Labs: 06/26/2017: ALT 15; B Natriuretic Peptide 46.9; BUN 15; Creatinine, Ser  0.98; Hemoglobin 12.7; Platelets 274; Potassium 4.0; Sodium 129   Lipid Panel No results found for: CHOL, TRIG, HDL, CHOLHDL, VLDL, LDLCALC, LDLDIRECT  Additional studies/ records that were reviewed today include:  CT scan performed July 2018: IMPRESSION: Stable moderate-sized hiatal hernia.   No evidence of pulmonary emboli.   Persistent density in the left upper lobe with a less nodular component likely representing some chronic postinflammatory change. The lack of significant interval growth suggests a more benign etiology.   Aortic Atherosclerosis (ICD10-I70.0).    ASSESSMENT:    1. Mobitz I   2. Coronary artery calcification seen on CAT scan   3. Bilateral pulmonary embolism ([place])   4. Deep vein thrombosis (DVT) of lower extremity, unspecified chronicity, unspecified laterality, unspecified vein (HCC)   5. Dyspnea, unspecified type      PLAN:  In order of problems listed above:  1. I reviewed the electrocardiogram from the ER visit. I'm not sure that Mobitz 1 second-degree block was really present. Appears to be more blocked PACs to me. We will do a 48 hour Holter to exclude chronotropic incompetence and or AV block which could potentially explain some of the patient's variable dyspnea on exertion. 2. Plan no work up. LDL target less than 100 3. Continue anticoagulation therapy 4. Continue chronic anticoagulation therapy 5. Possibly an ischemic equivalent versus rhythm disturbance versus deconditioning and age.  The monitor  may not prove fruitful. If there is chronotropic incompetence or higher grade AV block, pacemaker therapy may be a consideration. Actions will be dependent upon findings. Follow-up if clinically indicated post monitoring.    Medication Adjustments/Labs and Tests Ordered: Current medicines are reviewed at length with the patient today.  Concerns regarding medicines are outlined above.  Medication changes, Labs and Tests ordered today are listed in the Patient Instructions below. Patient Instructions  Medication Instructions:  Your physician recommends that you continue on your current medications as directed. Please refer to the Current Medication list given to you today.  Labwork: None  Testing/Procedures: Your physician has recommended that you wear a 48 hour holter monitor. Holter monitors are medical devices that record the heart's electrical activity. Doctors most often use these monitors to diagnose arrhythmias. Arrhythmias are problems with the speed or rhythm of the heartbeat. The monitor is a small, portable device. You can wear one while you do your normal daily activities. This is usually used to diagnose what is causing palpitations/syncope (passing out).   Follow-Up: Your physician recommends that you schedule a follow-up appointment as needed with Dr. Tamala Julian.    Any Other Special Instructions Will Be Listed Below (If Applicable).     If you need a refill on your cardiac medications before your next appointment, please call your pharmacy.      Signed, Sinclair Grooms, MD  09/25/2017 11:46 AM    [facility] [address], [place], [place]  [address] Phone: [phone]; Fax: [phone]

## 2017-10-08 ENCOUNTER — Ambulatory Visit (INDEPENDENT_AMBULATORY_CARE_PROVIDER_SITE_OTHER): Payer: Medicare Other

## 2017-10-08 ENCOUNTER — Other Ambulatory Visit: Payer: Self-pay | Admitting: Interventional Cardiology

## 2017-10-08 DIAGNOSIS — R0602 Shortness of breath: Secondary | ICD-10-CM

## 2017-10-08 DIAGNOSIS — I441 Atrioventricular block, second degree: Secondary | ICD-10-CM

## 2017-10-27 ENCOUNTER — Telehealth: Payer: Self-pay | Admitting: Interventional Cardiology

## 2017-10-27 NOTE — Telephone Encounter (Signed)
Patient calling, would like to know monitor results

## 2017-10-27 NOTE — Telephone Encounter (Signed)
Spoke with Crystal [name] and made her aware that Dr. Tamala Julian received monitor today and I am just waiting on him to review.  Advised I will call with results as soon as he gets those over to me.  Crystal [name] appreciative for update.

## 2018-03-08 ENCOUNTER — Emergency Department (HOSPITAL_COMMUNITY): Payer: Medicare Other

## 2018-03-08 ENCOUNTER — Emergency Department (HOSPITAL_COMMUNITY)
Admission: EM | Admit: 2018-03-08 | Discharge: 2018-03-09 | Disposition: A | Discharge status: Home / Self Care | Payer: Medicare Other | Attending: Emergency Medicine | Admitting: Emergency Medicine

## 2018-03-08 ENCOUNTER — Encounter (HOSPITAL_COMMUNITY): Payer: Self-pay

## 2018-03-08 DIAGNOSIS — Z7901 Long term (current) use of anticoagulants: Secondary | ICD-10-CM | POA: Diagnosis not present

## 2018-03-08 DIAGNOSIS — R079 Chest pain, unspecified: Secondary | ICD-10-CM

## 2018-03-08 DIAGNOSIS — Z8542 Personal history of malignant neoplasm of other parts of uterus: Secondary | ICD-10-CM | POA: Diagnosis not present

## 2018-03-08 DIAGNOSIS — Z87891 Personal history of nicotine dependence: Secondary | ICD-10-CM | POA: Insufficient documentation

## 2018-03-08 DIAGNOSIS — Z79899 Other long term (current) drug therapy: Secondary | ICD-10-CM | POA: Diagnosis not present

## 2018-03-08 DIAGNOSIS — J45909 Unspecified asthma, uncomplicated: Secondary | ICD-10-CM | POA: Insufficient documentation

## 2018-03-08 DIAGNOSIS — Z96659 Presence of unspecified artificial knee joint: Secondary | ICD-10-CM | POA: Insufficient documentation

## 2018-03-08 DIAGNOSIS — K449 Diaphragmatic hernia without obstruction or gangrene: Secondary | ICD-10-CM | POA: Diagnosis not present

## 2018-03-08 DIAGNOSIS — I10 Essential (primary) hypertension: Secondary | ICD-10-CM | POA: Diagnosis not present

## 2018-03-08 LAB — CBC WITH DIFFERENTIAL/PLATELET
BASOS PCT: 0 %
Basophils Absolute: 0 10*3/uL (ref 0.0–0.1)
EOS ABS: 0.2 10*3/uL (ref 0.0–0.7)
Eosinophils Relative: 3 %
HCT: 36.4 % (ref 36.0–46.0)
HEMOGLOBIN: 11.9 g/dL — AB (ref 12.0–15.0)
LYMPHS ABS: 1.5 10*3/uL (ref 0.7–4.0)
Lymphocytes Relative: 23 %
MCH: 31.4 pg (ref 26.0–34.0)
MCHC: 32.7 g/dL (ref 30.0–36.0)
MCV: 96 fL (ref 78.0–100.0)
Monocytes Absolute: 0.4 10*3/uL (ref 0.1–1.0)
Monocytes Relative: 7 %
NEUTROS PCT: 67 %
Neutro Abs: 4.3 10*3/uL (ref 1.7–7.7)
Platelets: 244 10*3/uL (ref 150–400)
RBC: 3.79 MIL/uL — AB (ref 3.87–5.11)
RDW: 13.9 % (ref 11.5–15.5)
WBC: 6.4 10*3/uL (ref 4.0–10.5)

## 2018-03-08 LAB — COMPREHENSIVE METABOLIC PANEL
ALT: 12 U/L — AB (ref 14–54)
ANION GAP: 10 (ref 5–15)
AST: 15 U/L (ref 15–41)
Albumin: 3.3 g/dL — ABNORMAL LOW (ref 3.5–5.0)
Alkaline Phosphatase: 71 U/L (ref 38–126)
BUN: 14 mg/dL (ref 6–20)
CO2: 25 mmol/L (ref 22–32)
CREATININE: 0.84 mg/dL (ref 0.44–1.00)
Calcium: 8.7 mg/dL — ABNORMAL LOW (ref 8.9–10.3)
Chloride: 95 mmol/L — ABNORMAL LOW (ref 101–111)
GFR, EST NON AFRICAN AMERICAN: 59 mL/min — AB (ref >60)
Glucose, Bld: 102 mg/dL — ABNORMAL HIGH (ref 65–99)
Potassium: 4.1 mmol/L (ref 3.5–5.1)
Sodium: 130 mmol/L — ABNORMAL LOW (ref 135–145)
Total Bilirubin: 0.8 mg/dL (ref 0.3–1.2)
Total Protein: 5.9 g/dL — ABNORMAL LOW (ref 6.5–8.1)

## 2018-03-08 LAB — PROTIME-INR
INR: 1.97
PROTHROMBIN TIME: 22.3 s — AB (ref 11.4–15.2)

## 2018-03-08 LAB — TROPONIN I: Troponin I: <0.03 ng/mL (ref <0.03)

## 2018-03-08 MED ORDER — IOPAMIDOL (ISOVUE-300) INJECTION 61%
100.0000 mL | Freq: Once | INTRAVENOUS | Status: AC | PRN
  Administered 2018-03-08: 100 mL via INTRAVENOUS

## 2018-03-08 MED ORDER — IOPAMIDOL (ISOVUE-370) INJECTION 76%
INTRAVENOUS | Status: AC
  Filled 2018-03-08: qty 100

## 2018-03-08 NOTE — ED Notes (Signed)
Patient transported to CT 

## 2018-03-08 NOTE — ED Triage Notes (Signed)
Pt arrived via [facility] from home c/o central non-radiating chest pain.  No pain on arrival with EMS who gave 324mg  ASA.  PTA pt took 1 SL Nitro

## 2018-03-08 NOTE — ED Notes (Signed)
ED Provider at bedside. 

## 2018-03-08 NOTE — ED Provider Notes (Signed)
[facility] EMERGENCY DEPARTMENT Provider Note   CSN: [id number] Arrival date & time: 03/08/18  2044     History   Chief Complaint Chief Complaint  Patient presents with  . Chest Pain    HPI Crystal [name] is a 84 y.o. female.  The history is provided by the patient and a relative. No language interpreter was used.  Chest Pain      Crystal [name] is a 84 y.o. female who presents to the Emergency Department complaining of chest pain. She presents to the emergency department for evaluation of sharp and intermittent left-sided chest pain that began about one hour prior to ED arrival. She denies any associated shortness of breath. She did have some nausea and took some antacids with no significant change in her symptoms. She did take for baby aspirin prior to ED arrival as well as one nitroglycerin. There was no significant change in her symptoms following nitroglycerin. Currently she is pain free. 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 She reports increased swelling in her right leg for the last few days. No diaphoresis, fever, abdominal pain. She is concerned for recurrent blood clot. She was started on antibiotics for UTI three days ago.  Past Medical History:  Diagnosis Date  . Allergic sinusitis   . Arthritis   . Asthma   . Benign tumor of pituitary gland ([place]) 1999  . Cancer ([place])   . Diverticulosis   . GERD (gastroesophageal reflux disease)   . Hypertension   . Hyperthyroidism   . IBS (irritable bowel syndrome)   . IBS (irritable bowel syndrome)   . Peripheral neuropathy   . Polio   . Shingles   . Uterine cancer [facility])     Patient Active Problem List   Diagnosis Date Noted  . Second degree heart block 09/25/2017  . Dyspnea 08/13/2017  . DVT (deep venous thrombosis) ([place]) 01/18/2014  . Hypotension, unspecified 01/18/2014  . PE (pulmonary embolism) 01/17/2014    Past Surgical History:  Procedure Laterality Date    . CARPAL TUNNEL RELEASE Left   . CATARACT EXTRACTION Right   . CERVICAL POLYPECTOMY    . ESOPHAGOGASTRODUODENOSCOPY N/A 01/22/2014   Procedure: ESOPHAGOGASTRODUODENOSCOPY (EGD);  Surgeon: Wonda Horner, MD;  Location[facility] ENDOSCOPY;  Service: Endoscopy;  Laterality: N/A;  . REPLACEMENT TOTAL KNEE    . TONSILLECTOMY    . TOTAL SHOULDER REPLACEMENT    . VENA CAVA FILTER PLACEMENT Right 01/24/2014   Procedure: INSERTION VENA-CAVA FILTER; ULTRASOUND GUIDED;  Surgeon: Rosetta Posner, MD;  Location: [facility] OR;  Service: Vascular;  Laterality: Right;     OB History   None      Home Medications    Prior to Admission medications   Medication Sig Start Date End Date Taking? Authorizing Provider  beclomethasone (QVAR) 80 MCG/ACT inhaler Inhale 1 puff into the lungs 2 (two) times daily.    [provider]  benazepril (LOTENSIN) 10 MG tablet Take 10 mg by mouth daily. 05/04/17   [provider]  cabergoline (DOSTINEX) 0.5 MG tablet Take 0.25 mg by mouth See admin instructions. Takes 1 every month    [provider]  Cholecalciferol (VITAMIN D) 2000 units tablet Take 2,000 Units by mouth daily.     [provider]  docusate sodium 100 MG CAPS Take 100 mg by mouth 2 (two) times daily. 02/01/14   Domenic Polite, MD  ferrous sulfate 325 (65 FE) MG  tablet Take 325 mg by mouth daily with breakfast.    [provider]  FLOVENT HFA 110 MCG/ACT inhaler Inhale 2 puffs into the lungs 2 (two) times daily as needed for wheezing. 05/24/17   [provider]  furosemide (LASIX) 20 MG tablet Take 20 mg by mouth daily. 06/05/17   [provider]  gabapentin (NEURONTIN) 400 MG capsule Take 400 mg by mouth 3 (three) times daily. 06/05/17   [provider]  latanoprost (XALATAN) 0.005 % ophthalmic solution Place 1 drop into both eyes at bedtime. 06/17/17   [provider]  levothyroxine (SYNTHROID, LEVOTHROID) 75 MCG tablet Take 75 mcg by mouth daily  before breakfast.     [provider]  LORazepam (ATIVAN) 0.5 MG tablet Take 0.5 mg by mouth at bedtime as needed for sleep. 06/09/17   [provider]  mirtazapine (REMERON) 30 MG tablet Take 30 mg by mouth at bedtime.     [provider]  Omega-3 Fatty Acids (FISH OIL) 1000 MG CAPS Take 1,000 mg by mouth daily.     [provider]  omeprazole (PRILOSEC) 20 MG capsule Take 20 mg by mouth daily. 05/19/17   [provider]  pramipexole (MIRAPEX) 0.75 MG tablet Take 0.75 mg by mouth at bedtime.    [provider]  ranitidine (ZANTAC) 300 MG tablet Take 300 mg by mouth daily as needed for heartburn.    [provider]  traZODone (DESYREL) 50 MG tablet Take 50 mg by mouth at bedtime as needed for sleep.    [provider]  Vitamin D, Ergocalciferol, (DRISDOL) 50000 units CAPS capsule Take 50,000 Units by mouth once a week. 04/28/17   [provider]  warfarin (COUMADIN) 3 MG tablet Take 3 mg by mouth daily.    [provider]  warfarin (COUMADIN) 4 MG tablet Take 1 tablet (4 mg total) by mouth daily. For INR 2-2.5 02/01/14   Domenic Polite, MD  zolpidem (AMBIEN) 10 MG tablet Take 10 mg by mouth at bedtime. 05/27/17   [provider]    Family History Family History  Problem Relation Age of Onset  . Cancer Mother   . Breast cancer Mother   . Heart disease Father     Social History Social History   Tobacco Use  . Smoking status: Former Research scientist (life sciences)  . Smokeless tobacco: Never Used  Substance Use Topics  . Alcohol use: No  . Drug use: No     Allergies   Clinoril [sulindac]; Erythromycin; Penicillins; Prozac [fluoxetine hcl]; and Sulfa antibiotics   Review of Systems Review of Systems  Cardiovascular: Positive for chest pain.  All other systems reviewed and are negative.    Physical Exam Updated Vital Signs BP 125/60   Pulse 65   Temp 98.5 F (36.9 C) (Oral)   Resp 17   Ht 5\' 5"  (1.651  m)   Wt 90.7 kg (200 lb)   SpO2 95%   BMI 33.28 kg/m   Physical Exam  Constitutional: She is oriented to person, place, and time. She appears well-developed and well-nourished.  HENT:  Head: Normocephalic and atraumatic.  Cardiovascular: Normal rate and regular rhythm.  No murmur heard. Pulmonary/Chest: Effort normal and breath sounds normal. No respiratory distress.  Abdominal: Soft. There is no tenderness. There is no rebound and no guarding.  Musculoskeletal: She exhibits no tenderness.  None pitting edema to the right lower extremity. Mild bilateral calf tenderness.  Neurological: She is alert and oriented to person,  place, and time.  Skin: Skin is warm and dry.  Psychiatric: She has a normal mood and affect. Her behavior is normal.  Nursing note and vitals reviewed.    ED Treatments / Results  Labs (all labs ordered are listed, but only abnormal results are displayed) Labs Reviewed  CBC WITH DIFFERENTIAL/PLATELET - Abnormal; Notable for the following components:      Result Value   RBC 3.79 (*)    Hemoglobin 11.9 (*)    All other components within normal limits  PROTIME-INR - Abnormal; Notable for the following components:   Prothrombin Time 22.3 (*)    All other components within normal limits  COMPREHENSIVE METABOLIC PANEL  TROPONIN I    EKG EKG Interpretation  Date/Time:  Sunday March 08 2018 20:58:06 EDT Ventricular Rate:  67 PR Interval:    QRS Duration: 98 QT Interval:  409 QTC Calculation: 432 R Axis:   -20 Text Interpretation:  Sinus rhythm Prolonged PR interval Borderline left axis deviation Consider anterior infarct Confirmed by Quintella Reichert [phone]) on 03/08/2018 9:14:13 PM   Radiology Dg Chest 2 View  Result Date: 03/08/2018 CLINICAL DATA:  Central and left-sided chest pain. EXAM: CHEST - 2 VIEW COMPARISON:  06/26/2017 FINDINGS: Rotated film. Asymmetric elevation of the right hemidiaphragm. Low lung volumes. The cardio pericardial silhouette is  enlarged. Left base collapse/consolidation. No overt pulmonary edema or substantial pleural effusion. Bones are diffusely demineralized. Telemetry leads overlie the chest. IMPRESSION: Low volume rotated film with cardiomegaly and left base collapse/consolidation. Electronically Signed   By: Misty Stanley M.D.   On: 03/08/2018 22:00    Procedures Procedures (including critical care time)  Medications Ordered in ED Medications - No data to display   Initial Impression / Assessment and Plan / ED Course  I have reviewed the triage vital signs and the nursing notes.  Pertinent labs & imaging results that were available during my care of the patient were reviewed by me and considered in my medical decision making (see chart for details).     Patient with history of DVT and PE on Coumadin therapy here for evaluation of intermittent left-sided chest pain. She is well appearing on examination with no acute respiratory distress. EKG with no acute ischemic changes. Plan to image to rule out recurrent PE. Patient care transferred pending labs and imaging.  Final Clinical Impressions(s) / ED Diagnoses   Final diagnoses:  None    ED Discharge Orders    None       Quintella Reichert, MD 03/08/18 2230

## 2018-03-09 LAB — TROPONIN I: Troponin I: <0.03 ng/mL (ref <0.03)

## 2018-03-09 NOTE — Discharge Instructions (Signed)
Your workup in the emergency department today was reassuring and did not reveal a concerning cause of your chest pain.  We recommend that you continue your daily medications and follow-up with your primary care doctor regarding your visit today.  Return to the emergency department for any new or concerning symptoms.

## 2018-03-09 NOTE — ED Provider Notes (Signed)
53:25 AM 84 year old female presenting for chest pain.  Care assumed from Dr. Ralene Bathe at change of shift ending CT angiogram.  CT angiogram without evidence of pulmonary embolus.  She does have a large esophageal hiatal hernia.  There is evidence of chronic changes to the left upper lung, stable since 2015.  No other concerning cardiopulmonary process on imaging.  Patient has had a negative troponin x2.  Symptoms more atypical for cardiac etiology.  She has remained hemodynamically stable.  Patient appropriate for follow-up with her primary care doctor.  Return precautions provided. Patient discharged in stable condition with no unaddressed concerns.   Results for orders placed or performed during the hospital encounter of 03/08/18  Comprehensive metabolic panel  Result Value Ref Range   Sodium 130 (L) 135 - 145 mmol/L   Potassium 4.1 3.5 - 5.1 mmol/L   Chloride 95 (L) 101 - 111 mmol/L   CO2 25 22 - 32 mmol/L   Glucose, Bld 102 (H) 65 - 99 mg/dL   BUN 14 6 - 20 mg/dL   Creatinine, Ser 0.84 0.44 - 1.00 mg/dL   Calcium 8.7 (L) 8.9 - 10.3 mg/dL   Total Protein 5.9 (L) 6.5 - 8.1 g/dL   Albumin 3.3 (L) 3.5 - 5.0 g/dL   AST 15 15 - 41 U/L   ALT 12 (L) 14 - 54 U/L   Alkaline Phosphatase 71 38 - 126 U/L   Total Bilirubin 0.8 0.3 - 1.2 mg/dL   GFR calc non Af Amer 59 (L) >60 mL/min   GFR calc Af Amer >60 >60 mL/min   Anion gap 10 5 - 15  CBC with Differential  Result Value Ref Range   WBC 6.4 4.0 - 10.5 K/uL   RBC 3.79 (L) 3.87 - 5.11 MIL/uL   Hemoglobin 11.9 (L) 12.0 - 15.0 g/dL   HCT 36.4 36.0 - 46.0 %   MCV 96.0 78.0 - 100.0 fL   MCH 31.4 26.0 - 34.0 pg   MCHC 32.7 30.0 - 36.0 g/dL   RDW 13.9 11.5 - 15.5 %   Platelets 244 150 - 400 K/uL   Neutrophils Relative % 67 %   Neutro Abs 4.3 1.7 - 7.7 K/uL   Lymphocytes Relative 23 %   Lymphs Abs 1.5 0.7 - 4.0 K/uL   Monocytes Relative 7 %   Monocytes Absolute 0.4 0.1 - 1.0 K/uL   Eosinophils Relative 3 %   Eosinophils Absolute 0.2 0.0 -  0.7 K/uL   Basophils Relative 0 %   Basophils Absolute 0.0 0.0 - 0.1 K/uL  Troponin I  Result Value Ref Range   Troponin I <0.03 <0.03 ng/mL  Protime-INR  Result Value Ref Range   Prothrombin Time 22.3 (H) 11.4 - 15.2 seconds   INR 1.97   Troponin I  Result Value Ref Range   Troponin I <0.03 <0.03 ng/mL   Dg Chest 2 View  Result Date: 03/08/2018 CLINICAL DATA:  Central and left-sided chest pain. EXAM: CHEST - 2 VIEW COMPARISON:  06/26/2017 FINDINGS: Rotated film. Asymmetric elevation of the right hemidiaphragm. Low lung volumes. The cardio pericardial silhouette is enlarged. Left base collapse/consolidation. No overt pulmonary edema or substantial pleural effusion. Bones are diffusely demineralized. Telemetry leads overlie the chest. IMPRESSION: Low volume rotated film with cardiomegaly and left base collapse/consolidation. Electronically Signed   By: Misty Stanley M.D.   On: 03/08/2018 22:00   Ct Angio Chest Pe W/cm &/or Wo Cm  Result Date: 03/08/2018 CLINICAL DATA:  Hervey Ard intermittent  left-sided chest pain beginning in our ago. History of DVT and on chronic Coumadin therapy. Increased swelling in the right leg. EXAM: CT ANGIOGRAPHY CHEST WITH CONTRAST TECHNIQUE: Multidetector CT imaging of the chest was performed using the standard protocol during bolus administration of intravenous contrast. Multiplanar CT image reconstructions and MIPs were obtained to evaluate the vascular anatomy. CONTRAST:  124mL ISOVUE-300 IOPAMIDOL (ISOVUE-300) INJECTION 61% COMPARISON:  06/26/2017 FINDINGS: Cardiovascular: Satisfactory opacification of the pulmonary arteries to the segmental level. No evidence of pulmonary embolism. Normal heart size. No pericardial effusion. Coronary artery and aortic calcification. Mediastinum/Nodes: There is a large esophageal hiatal hernia. Esophagus is mostly decompressed. No significant lymphadenopathy in the chest. Lungs/Pleura: Atelectasis in the lung bases. Nodular changes in the  lung apices without change since previous study, likely representing chronic postinflammatory process. Focal airspace disease in the left upper lung posteriorly is also unchanged since prior study. These findings have been present since 01/17/2014 consistent with a chronic process. No pleural effusions. No pneumothorax. Upper Abdomen: No acute abnormality. Musculoskeletal: Degenerative changes in the spine. No destructive bone lesions. Thoracic scoliosis convex towards the left. Review of the MIP images confirms the above findings. IMPRESSION: 1. No evidence of significant pulmonary embolus. 2. Large esophageal hiatal hernia. 3. Focal airspace disease in the left upper lung and nodular changes in both lung apices are stable since 2015 consistent with chronic process, likely postinflammatory. 4. Aortic atherosclerosis. Aortic Atherosclerosis (ICD10-I70.0). Electronically Signed   By: Lucienne Capers M.D.   On: 03/08/2018 23:37      Antonietta Breach, PA-C 03/09/18 0138    Fatima Blank, MD 03/09/18 1355

## 2018-05-07 ENCOUNTER — Other Ambulatory Visit: Payer: Self-pay | Admitting: Geriatric Medicine

## 2018-05-07 DIAGNOSIS — R1319 Other dysphagia: Secondary | ICD-10-CM

## 2018-05-07 DIAGNOSIS — R1310 Dysphagia, unspecified: Secondary | ICD-10-CM

## 2018-05-11 ENCOUNTER — Ambulatory Visit
Admission: RE | Admit: 2018-05-11 | Discharge: 2018-05-11 | Disposition: A | Discharge status: Home / Self Care | Payer: Medicare Other | Source: Ambulatory Visit | Attending: Geriatric Medicine | Admitting: Geriatric Medicine

## 2018-05-11 DIAGNOSIS — R1310 Dysphagia, unspecified: Secondary | ICD-10-CM

## 2018-05-11 DIAGNOSIS — R1319 Other dysphagia: Secondary | ICD-10-CM

## 2018-05-13 ENCOUNTER — Other Ambulatory Visit: Payer: Self-pay

## 2018-05-13 NOTE — Patient Outreach (Signed)
[place] [facility]) Care Management  05/13/2018  Crystal [name] [birth year] [id number]   Telephone Screen  Referral Date: 05/12/18 Referral Source: MD office Referral Reason: "HTN, needs a medical social worker-help around her house" Insurance: [facility] Medicare   Outreach attempt # 1 to patient. Spoke with patient and screening completed.    Social: Patient reisdes in her home. She reprots that her son is staying with her to assist with her care. She vocies that she requires assisatnce with ADLs/IADLs. She voices that her son helps her son but she does not like for him to assist wih personal care and see her naked. Patietn requesting [place] aide to help with personal care services and HHPT. Patient informed that [facility] does not provide these services. She states that she had [place] services in the past and would like them again. She denies any falls. DME in the home include walker wheelchair and BSC. Patient voices that her son takes her to MD appts.    Conditions: Per chart review, patient has PMH of HTN, prolactinoma, GERD, hypothyroidism, positional vertigo, PE and macular degeneration. She reports that her BP is well managed. She does not monitor it at home. She denies any issues or concerns regarding her medical conditions.    Medications: Per patient she is taking about 15 meds. She denies any issues affording meds. She states that her son fills med planner for her weekly. Patient denies any medication issues or concerns at this time.    Appointments: Patient saw PCP on 05/07/18 and sess about every 6 months.    Advance Directives: Patient unsure if she has completed.   Consent: [facility] services reviewed and discussed. Patient gave verbal consent for services. RN CM explained to patient [facility] services versus [place] services. Patient really desiring [place] services. She is agreeable to SW referral for possible further info/support on in home assistance that she may be eligible for.   Plan: RN  CM will send [facility] SW referral for possible resources on in home support/assistance. RN CM contacted PCP office. Left voicemail message for Roselie Awkward advising that patient is specifically requesting [place] aide and HHPT services which [facility] does not provide.  Enzo Montgomery, RN,BSN,CCM [place] Management Telephonic Care Management Coordinator Direct Phone: [phone] Toll Free: [phone] Fax: [phone]

## 2018-05-13 NOTE — Patient Outreach (Signed)
[facility]) Care Management  05/13/2018  KEMIAH BOOZ 27-Apr-1925 [id number]  BSW mailed patient a list of [place] home care providers as requested by [place].  Daneen Schick, Arita Miss, CDP Social Worker [phone]

## 2018-05-19 ENCOUNTER — Other Ambulatory Visit: Payer: Self-pay

## 2018-05-19 NOTE — Patient Outreach (Signed)
[name]) Care Management  05/19/2018  [name] [birth year] [id number]   Successful call to the patient to follow-up on referral for a home health aide. Patient able to verify HIPAA. BSW discussed role with [facility] Care Management. Patient seems confused as to reason for the call. BSW reminded patient of conversation with [facility] RNCM, Roshanda Florance. BSW inquired if patient had spoken with her physician requesting home health therapy as she mentioned to [place]. Patient stated "a Education officer, museum is coming today". Patient is unable to state which company the Education officer, museum works for or the reason for her to visit the patients home.   Patient agreeable to have this BSW contact her later in the week to finish community resource consult.  Daneen Schick, Arita Miss, CDP Social Worker [phone]

## 2018-05-22 ENCOUNTER — Other Ambulatory Visit: Payer: Self-pay

## 2018-05-22 NOTE — Patient Outreach (Signed)
[place] [facility]) Care Management  05/22/2018  JOI LEYVA [birth year] [id number]   BSW contacted patient to follow up on need for home health resources. Patient able to verify HIPAA. Patient continues to be confused about what resources she may need. Patient confirms an in home assessment earlier this week stating "two people came out and asked me questions". Patient unable to recall the visitors names, what company they are affiliated with, or what they were there to assess. Patient placed this BSW on hold to look for their name stating she thinks she wrote it down. Patient unsuccessful in locating. BSW asked the patient if the two assessors communicated with the patient that she was eligible to receive services. Patient stated "yes. Well I think so but nobody else has been here since".  BSW asked patient for permission to contact her son in hopes of determining if the patient needed continued support from this BSW. Patient gave verbal consent for this BSW to contact her son.  BSW placed a call to the patients son, Bartolo Darter. BSW left a HIPAA compliant voice message requesting a return call to discuss the patients community resource needs.   BSW also contacted patients primary physicians office and spoke to [place], Dr. Carlyle Lipa CMA. [place] unable to locate any orders in the patients chart for home health orders. [place] stated she thought she placed a referral to [facility] for home health but is unable to find record of a referral. [place] stated she would contact [facility] to inquire if an order was received for the patient. BSW provided [place] with this BSW's contact information. [place] stated she would "call if she found something out".   BSW to outreach the patients son next week if a return call is not received.   Daneen Schick, BSW, CDP [facility] [phone]

## 2018-05-27 ENCOUNTER — Other Ambulatory Visit: Payer: Self-pay

## 2018-05-27 NOTE — Patient Outreach (Signed)
[facility]) Care Management  05/27/2018  Crystal [name] 1925/04/16 [id number]  BSW attempted to contact patients son to follow up on voice message left on June 21st considering this BSW never received a return call. Unfortunately today's call was also unsuccessful. BSW left the patients son a HIPAA compliant voice message requesting a return call.  BSW then placed a call to the patient, HIPAA identifiers confirmed. BSW introduced self to patient and reason for today's call. BSW inquired if in home aide services have begun. Patient stated "no it hasn't. I put a call back to one of the social workers today and left a message". Patient was able to provide that she called "[employer]". Patient also stated the business card read "In home aide social worker for [facility]".   Patient unable to recall contacting this service nor is patient able to verbalize she was referred by another agency. Patient is unclear if she is on a waiting list or has been granted this service. BSW offered to meet with the patient in the patients home to conduct community resource consult. Patient agrees with this plan and scheduled a home visit with this BSW for Tuesday July 2nd.  Daneen Schick, BSW, CDP [facility] [phone]

## 2018-06-02 ENCOUNTER — Other Ambulatory Visit: Payer: Self-pay

## 2018-06-02 NOTE — Patient Outreach (Signed)
[facility]) Care Management  06/02/2018  VAUDIE ENGEBRETSEN [birth year] [id number]  BSW arrived at patients home on today's date to conduct an initial home visit. BSW met with both the patient and her son. BSW introduced self and reminded the patient of the reason for the scheduled visit. BSW indicated the patients primary physician, doctor Petal had referred the patient to [place] Management to assist with obtaining an in home aide. At which time the patients son stated "that is who was just here".   It appears the patient was just approved for a program through DSS to provide in home assistance. Neither the patient nor the patients son could provide the name of the program except that it is "grant funded". Patient states the service just started within the last week. The patient states she has a CNA who comes to the home every day for 2 hours each day to assist with household chores and ADL's.   BSW introduced other [place] Management services such as pharmacy or a nurse care manager, both of which were declined by the patient. BSW provided this BSW's contact information if further needs arise. BSW also encouraged the patient to speak with her primary physician if she desires a future referral. BSW to perform a case closure.  Daneen Schick, BSW, CDP [facility] [phone]

## 2018-06-03 ENCOUNTER — Ambulatory Visit: Payer: Medicare Other | Attending: Geriatric Medicine | Admitting: Rehabilitative and Restorative Service Providers"

## 2018-06-03 ENCOUNTER — Ambulatory Visit: Payer: Medicare Other

## 2018-06-03 DIAGNOSIS — R2689 Other abnormalities of gait and mobility: Secondary | ICD-10-CM | POA: Insufficient documentation

## 2018-06-03 DIAGNOSIS — M6281 Muscle weakness (generalized): Secondary | ICD-10-CM | POA: Diagnosis present

## 2018-06-03 DIAGNOSIS — R2681 Unsteadiness on feet: Secondary | ICD-10-CM | POA: Insufficient documentation

## 2018-06-03 DIAGNOSIS — H8111 Benign paroxysmal vertigo, right ear: Secondary | ICD-10-CM | POA: Insufficient documentation

## 2018-06-03 NOTE — Therapy (Signed)
[facility] [address] [place], [place], [address] Phone: [phone]   Fax:  [phone]  Physical Therapy Evaluation  Patient Details  Name: Crystal [name] MRN: [id number] Date of Birth: 08-28-1925 Referring Provider: Lajean Manes, MD   Encounter Date: 06/03/2018  PT End of Session - 06/03/18 2101    Visit Number  1    Number of Visits  5    Date for PT Re-Evaluation  07/18/18    Authorization Type  [employer] medicare    PT Start Time  1150    PT Stop Time  1230    PT Time Calculation (min)  40 min    Activity Tolerance  Patient tolerated treatment well    Behavior During Therapy  [facility] for tasks assessed/performed       Past Medical History:  Diagnosis Date  . Allergic sinusitis   . Arthritis   . Asthma   . Benign tumor of pituitary gland ([place]) 1999  . Cancer ([place])   . Diverticulosis   . GERD (gastroesophageal reflux disease)   . Hypertension   . Hyperthyroidism   . IBS (irritable bowel syndrome)   . IBS (irritable bowel syndrome)   . Peripheral neuropathy   . Polio   . Shingles   . Uterine cancer [facility])     Past Surgical History:  Procedure Laterality Date  . CARPAL TUNNEL RELEASE Left   . CATARACT EXTRACTION Right   . CERVICAL POLYPECTOMY    . ESOPHAGOGASTRODUODENOSCOPY N/A 01/22/2014   Procedure: ESOPHAGOGASTRODUODENOSCOPY (EGD);  Surgeon: Wonda Horner, MD;  Location: [facility] ENDOSCOPY;  Service: Endoscopy;  Laterality: N/A;  . REPLACEMENT TOTAL KNEE    . TONSILLECTOMY    . TOTAL SHOULDER REPLACEMENT    . VENA CAVA FILTER PLACEMENT Right 01/24/2014   Procedure: INSERTION VENA-CAVA FILTER; ULTRASOUND GUIDED;  Surgeon: Rosetta Posner, MD;  Location: [facility];  Service: Vascular;  Laterality: Right;    There were no vitals filed for this visit.   Subjective Assessment - 06/03/18 1158    Subjective  "When I go to bed and lie down and look to the right, I get dizzy."  She also experiences this sensation  when she leans back in her recliner.  Symptoms began about a month ago.  She walks with a RW to the bathroom.  She notes using a RW x 1-2 years.  "I just can't get around that good.  My legs just give out on me."     Patient is accompained by:  Family member son lives with her    Pertinent History  Just hired 2 hours/day caregivers to help with cleaning and bathing.     Patient Stated Goals  Son reports "show her how to properly do the maneuver."  Patient notes reduce dizziness.  Also wants to be able to use her legs a little more.    Currently in Pain?  Yes general aches and pains that come and go- uses tylenol to manage.  Her hip hurts when attempting to position herself in bed.  Low back is also sore in bed.    Pain Score  -- unable to rate.    Pain Location  Generalized    Pain Descriptors / Indicators  Aching;Sore    Pain Type  Chronic pain    Pain Onset  More than a month ago    Pain Frequency  Intermittent    Aggravating Factors   unsure    Pain Relieving Factors  tylenol         [facility] PT Assessment - 06/03/18 1205      Assessment   Medical Diagnosis  BPPV    Referring Provider  Lajean Manes, MD    Onset Date/Surgical Date  -- one month ago    Prior Therapy  none      Precautions   Precautions  Fall      Restrictions   Weight Bearing Restrictions  No      Balance Screen   Has the patient fallen in the past 6 months  No    Has the patient had a decrease in activity level because of a fear of falling?   Yes    Is the patient reluctant to leave their home because of a fear of falling?   Yes son does all community activities (grocery)      Home Environment   Living Environment  Private residence    Living Arrangements  Children    Type of [place] to enter    Entrance Stairs-Number of Steps  [place]  One level    [place] - 2 wheels;Shower seat;Wheelchair - manual;Bedside commode bar on bed      Prior Function   Level  of Independence  Independent with household mobility with device      Transfers   Transfers  Sit to [facility]    Sit to Stand  4: Min assist son pulls her up from arm    Sit to Stand Details (indicate cue type and reason)  Uses a lift chair in the home to move sit>stand.    Stand Pivot Transfers  4: Min assist      Ambulation/Gait   Ambulation/Gait  Yes    Ambulation/Gait Assistance  4: Min guard    Ambulation Distance (Feet)  5 Feet    Assistive device  -- held her son's arm to take steps toward mat table    Gait Pattern  Decreased stride length;Decreased dorsiflexion - right;Decreased dorsiflexion - left;Shuffle    Ambulation Surface  Level           Vestibular Assessment - 06/03/18 1207      Vestibular Assessment   General Observation  Patient arrives in manual w/c.  H/o episode of dizziness x days in the past (years ago per son) in which she saw Dr. Ernesto Rutherford for.  Polio affected R arm.      Symptom Behavior   Type of Dizziness  Spinning    Frequency of Dizziness  daily    Duration of Dizziness  seconds    Aggravating Factors  Rolling to right    Relieving Factors  Head stationary      Occulomotor Exam   Occulomotor Alignment  Normal mild L hypertropia    Spontaneous  Absent    Gaze-induced  Absent    Smooth Pursuits  Intact    Saccades  Intact      Vestibulo-Occular Reflex   VOR to Slow Head Movement  -- Mild dizziness with horizontal head turn      Positional Testing   Dix-Hallpike  Dix-Hallpike Right;Dix-Hallpike Left    Sidelying Test  Sidelying Right;Sidelying Left    Horizontal Canal Testing  Horizontal Canal Right;Horizontal Canal Left      Dix-Hallpike Right   Dix-Hallpike Right Duration  8 seconds    Dix-Hallpike Right Symptoms  Upbeat, right rotatory nystagmus  Dix-Hallpike Left   Dix-Hallpike Left Duration  none    Dix-Hallpike Left Symptoms  No nystagmus      Sidelying Right   Sidelying Right Duration  brief duration<4  seconds    Sidelying Right Symptoms  Upbeat, right rotatory nystagmus      Sidelying Left   Sidelying Left Duration  none    Sidelying Left Symptoms  No nystagmus      Horizontal Canal Right   Horizontal Canal Right Duration  subjectively reports sensation of dizziness momentarily with rolling    Horizontal Canal Right Symptoms  Normal      Horizontal Canal Left   Horizontal Canal Left Duration  none    Horizontal Canal Left Symptoms  Normal          Objective measurements completed on examination: See above findings.       Vestibular Treatment/Exercise - 06/03/18 1231      Vestibular Treatment/Exercise   Vestibular Treatment Provided  Canalith Repositioning    Canalith Repositioning  Epley Manuever Right       EPLEY MANUEVER RIGHT   Number of Reps   2    Overall Response  Symptoms Resolved    Response Details   no nystagmus 2nd rep            PT Education - 06/03/18 2100    Education Details  nature of BPPV, treatment approach    Person(s) Educated  Patient;Caregiver(s)    Methods  Explanation    Comprehension  Verbalized understanding          PT Long Term Goals - 06/03/18 2106      PT LONG TERM GOAL #1   Title  The patient will have negative R dix hallpike indicating resolution of BPPV    Time  4    Period  Weeks    Target Date  07/18/18      PT LONG TERM GOAL #2   Title  The patient and her son will verbalize self mgmt of BPPV with instructions on habituation.    Time  4    Period  Weeks    Target Date  07/18/18      PT LONG TERM GOAL #3   Title  The patient and her son will have HEP for LE strength, general mobility for post d/c.    Time  4    Period  Weeks    Target Date  07/18/18             Plan - 06/03/18 2107    Clinical Impression Statement  The patient is a 84 year old female presenting to OP neuro rehab with Right posterior canal BPPV and generalized weakness/immobility.  PT treated with Epley's maneuver today with no  nystagmus noted on 2nd repetition.  The patient has declining mobility relying on a lift chair in the home for sit>stand transfers.  PT encouraged increased mobilty using RW.  Patient's son notes that patient has HEP from prior [facility] PT, but not currently performing.  We discussed use of personal care aide (now coming 5 days/week x 2 hours) to assist with regular performance of HEP as this would benefit patient's functional status.     Clinical Presentation  Stable    Clinical Decision Making  Low    Rehab Potential  Good    PT Frequency  1x / week eval +    PT Duration  4 weeks    PT Treatment/Interventions  ADLs/Self Care Home Management;Gait training;Stair  training;Functional mobility training;Therapeutic activities;Therapeutic exercise;Canalith Repostioning;Vestibular;Patient/family education;Neuromuscular re-education    PT Next Visit Plan  Check R BPPV, habituation education for future mgmt of BPPV, review HEP standing ([place] components) and educate caregiver    Consulted and Agree with Plan of Care  Patient;Family member/caregiver    Family Member Consulted  patient's son       Patient will benefit from skilled therapeutic intervention in order to improve the following deficits and impairments:  Abnormal gait, Dizziness, Decreased balance, Decreased endurance, Decreased strength, Decreased mobility  Visit Diagnosis: BPPV (benign paroxysmal positional vertigo), right  Other abnormalities of gait and mobility  Muscle weakness (generalized)  Unsteadiness on feet     Problem List Patient Active Problem List   Diagnosis Date Noted  . Second degree heart block 09/25/2017  . Dyspnea 08/13/2017  . DVT (deep venous thrombosis) ([place]) 01/18/2014  . Hypotension, unspecified 01/18/2014  . PE (pulmonary embolism) 01/17/2014    Pamela Intrieri, PT 06/03/2018, 9:14 PM  [facility] [address] [place], [place], [address] Phone:  [phone]   Fax:  [phone]  Name: Crystal [name] MRN: [id number] Date of Birth: [birth year]

## 2018-06-08 ENCOUNTER — Encounter: Payer: Medicare Other | Admitting: Rehabilitative and Restorative Service Providers"

## 2018-06-15 ENCOUNTER — Encounter: Payer: Medicare Other | Admitting: Rehabilitative and Restorative Service Providers"

## 2018-06-17 ENCOUNTER — Ambulatory Visit: Payer: Medicare Other | Admitting: Physical Therapy

## 2019-07-02 IMAGING — CR DG CHEST 2V
2 series · 2 of 2 positions shown · non-contrast
Comparison: April 30, 2017 and January 17, 2014 chest radiograph;
chest CT January 17, 2014

CLINICAL DATA: Recent pneumonia.  Shortness of Breath

EXAM:
CHEST  2 VIEW

[w chest lat]
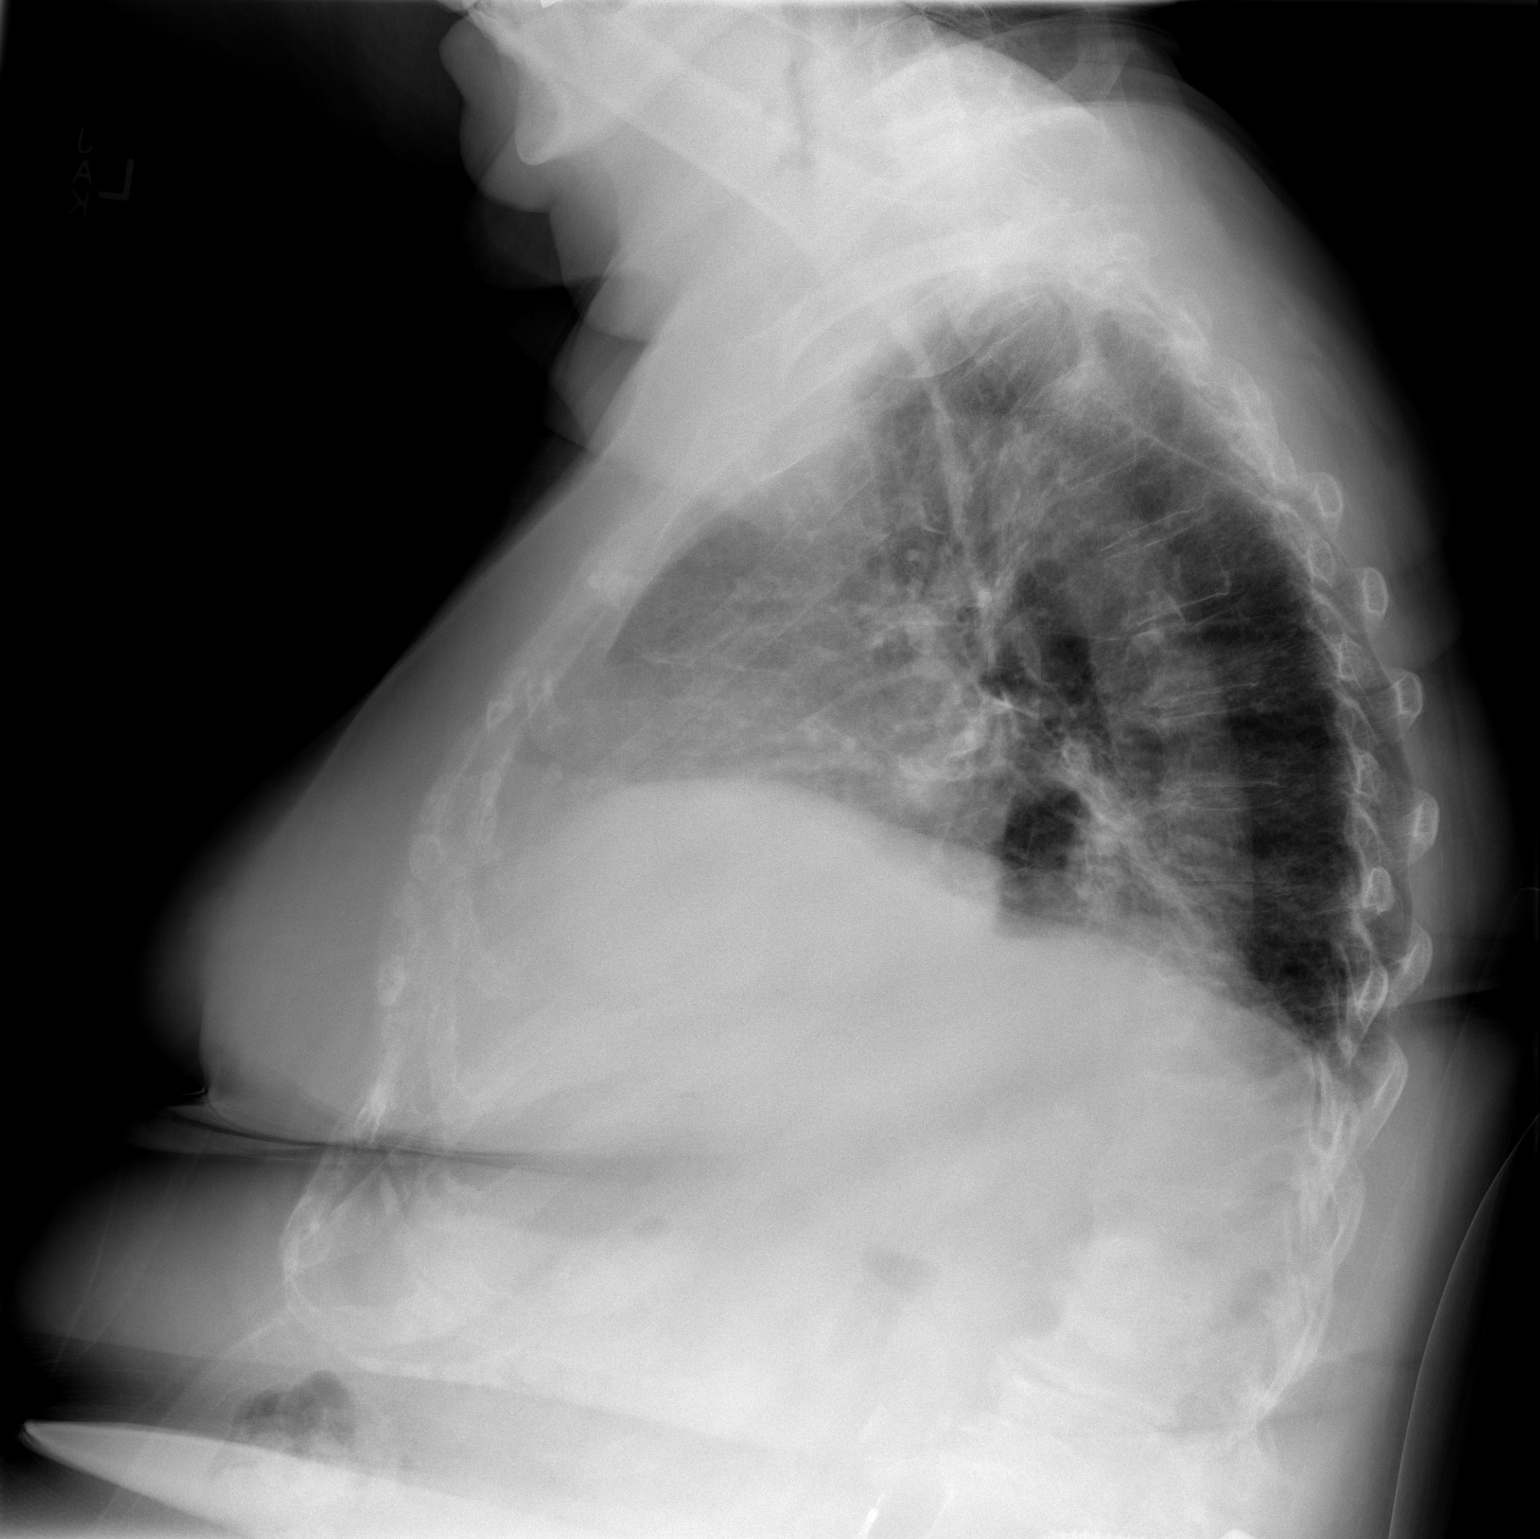

[w chest ap]
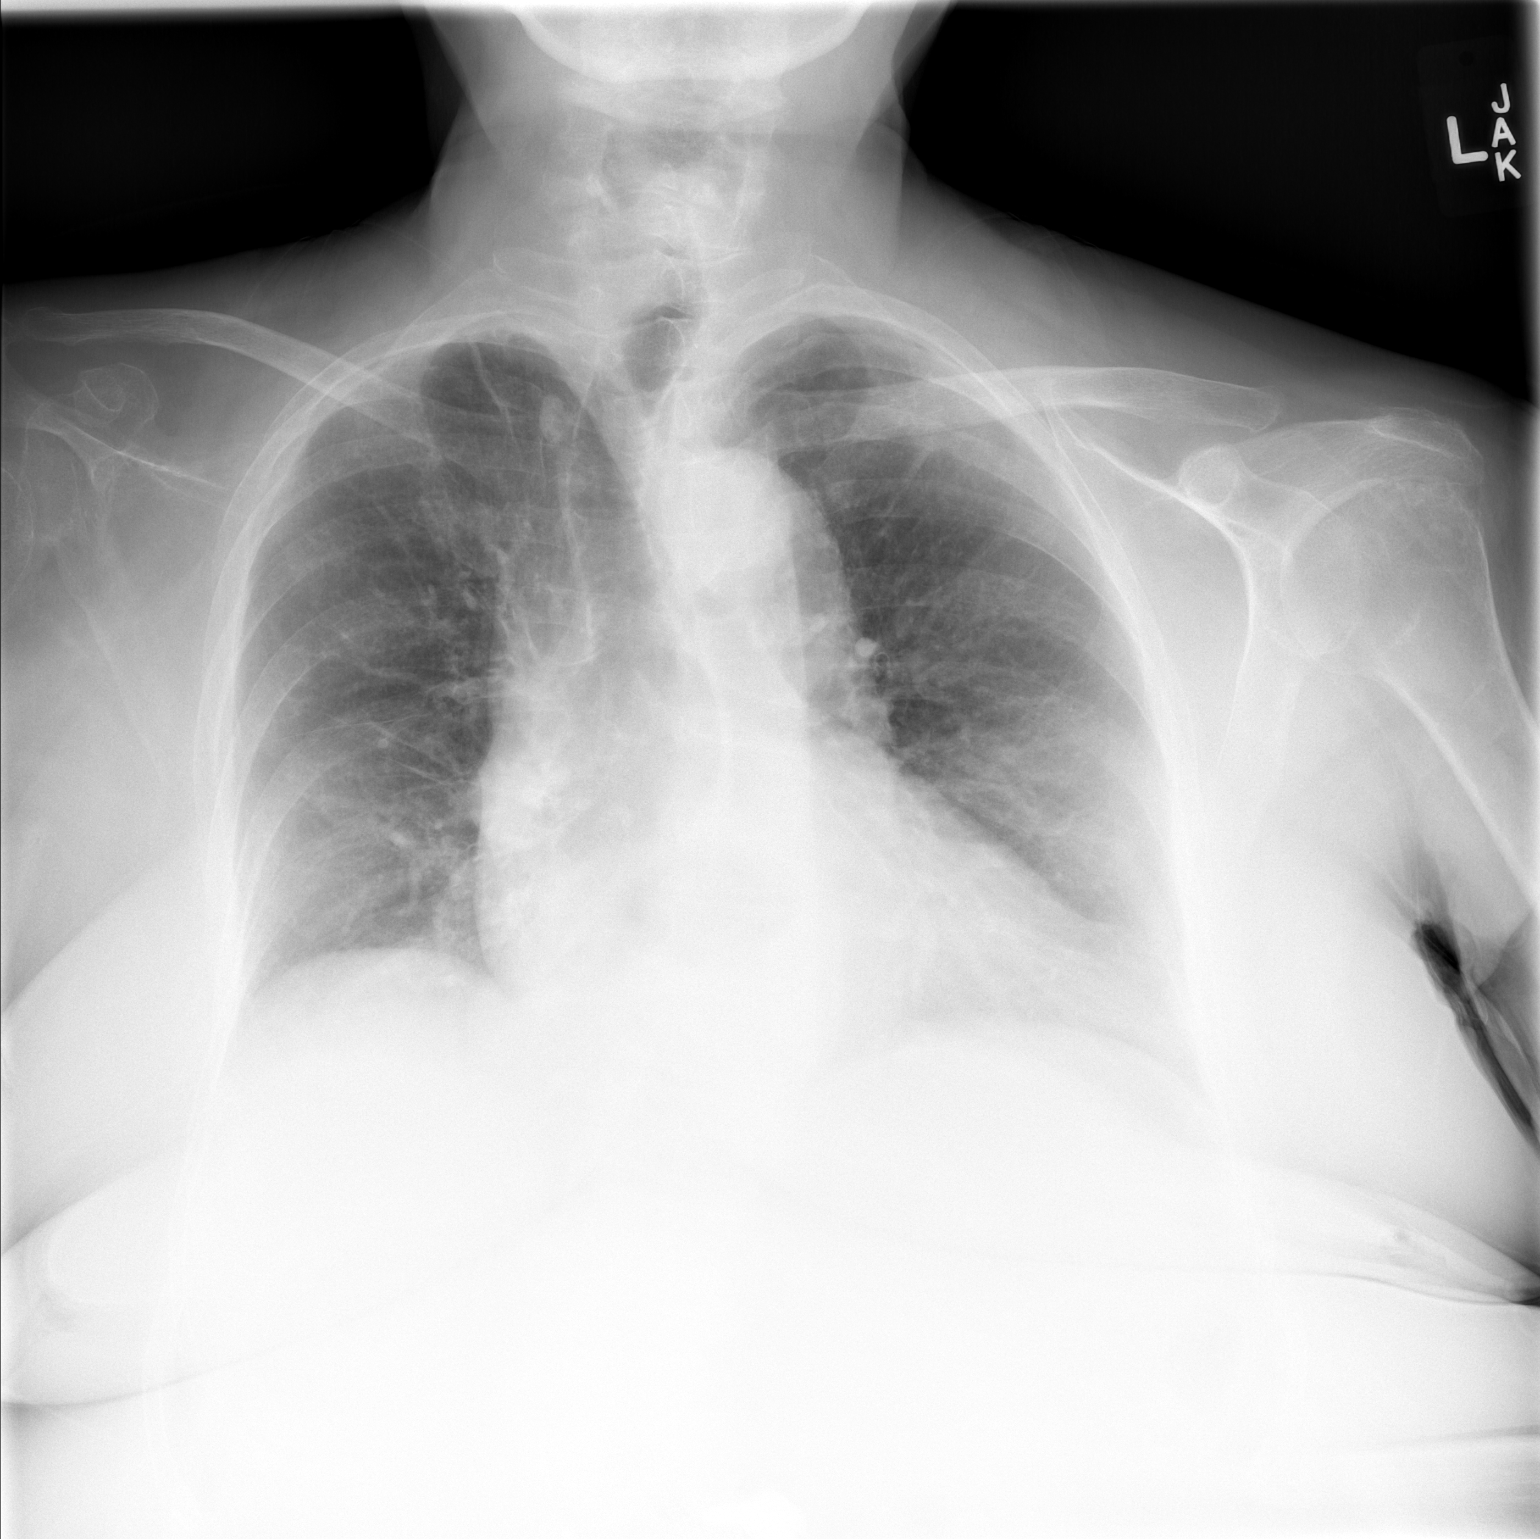

[2 of 2 positions shown; findings below may reference images not displayed]

FINDINGS: The previously noted area of increased opacity in the left upper
lobe is less apparent at this time. There is a degree of apparent
scarring in the left apex, similar to the 9482 study. Lungs
elsewhere are clear. Heart is upper normal in size with pulmonary
vascularity within normal limits. There is a moderate hiatal hernia.
No adenopathy evident. There is aortic atherosclerosis. There is
evidence of old trauma involving the lateral left clavicle.
IMPRESSION: Apparent scarring left apex. No areas of airspace consolidation
currently appreciable. Heart upper normal in size. Sizable hiatal
hernia. There is aortic atherosclerosis.

Aortic Atherosclerosis ([id number]-ZK1.1).

## 2020-12-05 DIAGNOSIS — I2781 Cor pulmonale (chronic): Secondary | ICD-10-CM | POA: Diagnosis not present

## 2020-12-05 DIAGNOSIS — Z7901 Long term (current) use of anticoagulants: Secondary | ICD-10-CM | POA: Diagnosis not present

## 2020-12-05 DIAGNOSIS — G2581 Restless legs syndrome: Secondary | ICD-10-CM | POA: Diagnosis not present

## 2020-12-05 DIAGNOSIS — I10 Essential (primary) hypertension: Secondary | ICD-10-CM | POA: Diagnosis not present

## 2020-12-05 DIAGNOSIS — I8390 Asymptomatic varicose veins of unspecified lower extremity: Secondary | ICD-10-CM | POA: Diagnosis not present

## 2020-12-05 DIAGNOSIS — M4800 Spinal stenosis, site unspecified: Secondary | ICD-10-CM | POA: Diagnosis not present

## 2020-12-05 DIAGNOSIS — H409 Unspecified glaucoma: Secondary | ICD-10-CM | POA: Diagnosis not present

## 2020-12-05 DIAGNOSIS — Z7951 Long term (current) use of inhaled steroids: Secondary | ICD-10-CM | POA: Diagnosis not present

## 2020-12-05 DIAGNOSIS — D352 Benign neoplasm of pituitary gland: Secondary | ICD-10-CM | POA: Diagnosis not present

## 2020-12-05 DIAGNOSIS — M1991 Primary osteoarthritis, unspecified site: Secondary | ICD-10-CM | POA: Diagnosis not present

## 2020-12-05 DIAGNOSIS — R1310 Dysphagia, unspecified: Secondary | ICD-10-CM | POA: Diagnosis not present

## 2020-12-05 DIAGNOSIS — E039 Hypothyroidism, unspecified: Secondary | ICD-10-CM | POA: Diagnosis not present

## 2020-12-05 DIAGNOSIS — G603 Idiopathic progressive neuropathy: Secondary | ICD-10-CM | POA: Diagnosis not present

## 2020-12-06 DIAGNOSIS — I2781 Cor pulmonale (chronic): Secondary | ICD-10-CM | POA: Diagnosis not present

## 2020-12-06 DIAGNOSIS — G2581 Restless legs syndrome: Secondary | ICD-10-CM | POA: Diagnosis not present

## 2020-12-06 DIAGNOSIS — M1991 Primary osteoarthritis, unspecified site: Secondary | ICD-10-CM | POA: Diagnosis not present

## 2020-12-06 DIAGNOSIS — M4800 Spinal stenosis, site unspecified: Secondary | ICD-10-CM | POA: Diagnosis not present

## 2020-12-06 DIAGNOSIS — E039 Hypothyroidism, unspecified: Secondary | ICD-10-CM | POA: Diagnosis not present

## 2020-12-06 DIAGNOSIS — Z7951 Long term (current) use of inhaled steroids: Secondary | ICD-10-CM | POA: Diagnosis not present

## 2020-12-06 DIAGNOSIS — H409 Unspecified glaucoma: Secondary | ICD-10-CM | POA: Diagnosis not present

## 2020-12-06 DIAGNOSIS — I10 Essential (primary) hypertension: Secondary | ICD-10-CM | POA: Diagnosis not present

## 2020-12-06 DIAGNOSIS — R1310 Dysphagia, unspecified: Secondary | ICD-10-CM | POA: Diagnosis not present

## 2020-12-06 DIAGNOSIS — I8390 Asymptomatic varicose veins of unspecified lower extremity: Secondary | ICD-10-CM | POA: Diagnosis not present

## 2020-12-06 DIAGNOSIS — D352 Benign neoplasm of pituitary gland: Secondary | ICD-10-CM | POA: Diagnosis not present

## 2020-12-06 DIAGNOSIS — G603 Idiopathic progressive neuropathy: Secondary | ICD-10-CM | POA: Diagnosis not present

## 2020-12-06 DIAGNOSIS — Z7901 Long term (current) use of anticoagulants: Secondary | ICD-10-CM | POA: Diagnosis not present

## 2020-12-07 DIAGNOSIS — Z7901 Long term (current) use of anticoagulants: Secondary | ICD-10-CM | POA: Diagnosis not present

## 2020-12-07 DIAGNOSIS — R1310 Dysphagia, unspecified: Secondary | ICD-10-CM | POA: Diagnosis not present

## 2020-12-07 DIAGNOSIS — G603 Idiopathic progressive neuropathy: Secondary | ICD-10-CM | POA: Diagnosis not present

## 2020-12-07 DIAGNOSIS — I2781 Cor pulmonale (chronic): Secondary | ICD-10-CM | POA: Diagnosis not present

## 2020-12-07 DIAGNOSIS — E039 Hypothyroidism, unspecified: Secondary | ICD-10-CM | POA: Diagnosis not present

## 2020-12-07 DIAGNOSIS — H409 Unspecified glaucoma: Secondary | ICD-10-CM | POA: Diagnosis not present

## 2020-12-07 DIAGNOSIS — I8390 Asymptomatic varicose veins of unspecified lower extremity: Secondary | ICD-10-CM | POA: Diagnosis not present

## 2020-12-07 DIAGNOSIS — Z7951 Long term (current) use of inhaled steroids: Secondary | ICD-10-CM | POA: Diagnosis not present

## 2020-12-07 DIAGNOSIS — D352 Benign neoplasm of pituitary gland: Secondary | ICD-10-CM | POA: Diagnosis not present

## 2020-12-07 DIAGNOSIS — I10 Essential (primary) hypertension: Secondary | ICD-10-CM | POA: Diagnosis not present

## 2020-12-07 DIAGNOSIS — M4800 Spinal stenosis, site unspecified: Secondary | ICD-10-CM | POA: Diagnosis not present

## 2020-12-07 DIAGNOSIS — G2581 Restless legs syndrome: Secondary | ICD-10-CM | POA: Diagnosis not present

## 2020-12-07 DIAGNOSIS — M1991 Primary osteoarthritis, unspecified site: Secondary | ICD-10-CM | POA: Diagnosis not present

## 2020-12-08 DIAGNOSIS — M1991 Primary osteoarthritis, unspecified site: Secondary | ICD-10-CM | POA: Diagnosis not present

## 2020-12-08 DIAGNOSIS — I2781 Cor pulmonale (chronic): Secondary | ICD-10-CM | POA: Diagnosis not present

## 2020-12-08 DIAGNOSIS — G2581 Restless legs syndrome: Secondary | ICD-10-CM | POA: Diagnosis not present

## 2020-12-08 DIAGNOSIS — I10 Essential (primary) hypertension: Secondary | ICD-10-CM | POA: Diagnosis not present

## 2020-12-08 DIAGNOSIS — H409 Unspecified glaucoma: Secondary | ICD-10-CM | POA: Diagnosis not present

## 2020-12-08 DIAGNOSIS — D352 Benign neoplasm of pituitary gland: Secondary | ICD-10-CM | POA: Diagnosis not present

## 2020-12-08 DIAGNOSIS — M4800 Spinal stenosis, site unspecified: Secondary | ICD-10-CM | POA: Diagnosis not present

## 2020-12-08 DIAGNOSIS — G603 Idiopathic progressive neuropathy: Secondary | ICD-10-CM | POA: Diagnosis not present

## 2020-12-08 DIAGNOSIS — Z7951 Long term (current) use of inhaled steroids: Secondary | ICD-10-CM | POA: Diagnosis not present

## 2020-12-08 DIAGNOSIS — Z7901 Long term (current) use of anticoagulants: Secondary | ICD-10-CM | POA: Diagnosis not present

## 2020-12-08 DIAGNOSIS — I8390 Asymptomatic varicose veins of unspecified lower extremity: Secondary | ICD-10-CM | POA: Diagnosis not present

## 2020-12-08 DIAGNOSIS — R1310 Dysphagia, unspecified: Secondary | ICD-10-CM | POA: Diagnosis not present

## 2020-12-08 DIAGNOSIS — E039 Hypothyroidism, unspecified: Secondary | ICD-10-CM | POA: Diagnosis not present

## 2020-12-11 DIAGNOSIS — G603 Idiopathic progressive neuropathy: Secondary | ICD-10-CM | POA: Diagnosis not present

## 2020-12-11 DIAGNOSIS — I10 Essential (primary) hypertension: Secondary | ICD-10-CM | POA: Diagnosis not present

## 2020-12-11 DIAGNOSIS — G2581 Restless legs syndrome: Secondary | ICD-10-CM | POA: Diagnosis not present

## 2020-12-11 DIAGNOSIS — D352 Benign neoplasm of pituitary gland: Secondary | ICD-10-CM | POA: Diagnosis not present

## 2020-12-11 DIAGNOSIS — I2781 Cor pulmonale (chronic): Secondary | ICD-10-CM | POA: Diagnosis not present

## 2020-12-11 DIAGNOSIS — R1310 Dysphagia, unspecified: Secondary | ICD-10-CM | POA: Diagnosis not present

## 2020-12-11 DIAGNOSIS — Z7901 Long term (current) use of anticoagulants: Secondary | ICD-10-CM | POA: Diagnosis not present

## 2020-12-11 DIAGNOSIS — I8390 Asymptomatic varicose veins of unspecified lower extremity: Secondary | ICD-10-CM | POA: Diagnosis not present

## 2020-12-11 DIAGNOSIS — M1991 Primary osteoarthritis, unspecified site: Secondary | ICD-10-CM | POA: Diagnosis not present

## 2020-12-11 DIAGNOSIS — E039 Hypothyroidism, unspecified: Secondary | ICD-10-CM | POA: Diagnosis not present

## 2020-12-11 DIAGNOSIS — M4800 Spinal stenosis, site unspecified: Secondary | ICD-10-CM | POA: Diagnosis not present

## 2020-12-11 DIAGNOSIS — H409 Unspecified glaucoma: Secondary | ICD-10-CM | POA: Diagnosis not present

## 2020-12-11 DIAGNOSIS — Z7951 Long term (current) use of inhaled steroids: Secondary | ICD-10-CM | POA: Diagnosis not present

## 2020-12-15 DIAGNOSIS — E039 Hypothyroidism, unspecified: Secondary | ICD-10-CM | POA: Diagnosis not present

## 2020-12-15 DIAGNOSIS — H409 Unspecified glaucoma: Secondary | ICD-10-CM | POA: Diagnosis not present

## 2020-12-15 DIAGNOSIS — I8390 Asymptomatic varicose veins of unspecified lower extremity: Secondary | ICD-10-CM | POA: Diagnosis not present

## 2020-12-15 DIAGNOSIS — Z7901 Long term (current) use of anticoagulants: Secondary | ICD-10-CM | POA: Diagnosis not present

## 2020-12-15 DIAGNOSIS — M4800 Spinal stenosis, site unspecified: Secondary | ICD-10-CM | POA: Diagnosis not present

## 2020-12-15 DIAGNOSIS — M1991 Primary osteoarthritis, unspecified site: Secondary | ICD-10-CM | POA: Diagnosis not present

## 2020-12-15 DIAGNOSIS — R1310 Dysphagia, unspecified: Secondary | ICD-10-CM | POA: Diagnosis not present

## 2020-12-15 DIAGNOSIS — Z7951 Long term (current) use of inhaled steroids: Secondary | ICD-10-CM | POA: Diagnosis not present

## 2020-12-15 DIAGNOSIS — I10 Essential (primary) hypertension: Secondary | ICD-10-CM | POA: Diagnosis not present

## 2020-12-15 DIAGNOSIS — G2581 Restless legs syndrome: Secondary | ICD-10-CM | POA: Diagnosis not present

## 2020-12-15 DIAGNOSIS — D352 Benign neoplasm of pituitary gland: Secondary | ICD-10-CM | POA: Diagnosis not present

## 2020-12-15 DIAGNOSIS — I2781 Cor pulmonale (chronic): Secondary | ICD-10-CM | POA: Diagnosis not present

## 2020-12-15 DIAGNOSIS — G603 Idiopathic progressive neuropathy: Secondary | ICD-10-CM | POA: Diagnosis not present

## 2020-12-19 DIAGNOSIS — H409 Unspecified glaucoma: Secondary | ICD-10-CM | POA: Diagnosis not present

## 2020-12-19 DIAGNOSIS — Z7901 Long term (current) use of anticoagulants: Secondary | ICD-10-CM | POA: Diagnosis not present

## 2020-12-19 DIAGNOSIS — G2581 Restless legs syndrome: Secondary | ICD-10-CM | POA: Diagnosis not present

## 2020-12-19 DIAGNOSIS — M1991 Primary osteoarthritis, unspecified site: Secondary | ICD-10-CM | POA: Diagnosis not present

## 2020-12-19 DIAGNOSIS — G603 Idiopathic progressive neuropathy: Secondary | ICD-10-CM | POA: Diagnosis not present

## 2020-12-19 DIAGNOSIS — I8390 Asymptomatic varicose veins of unspecified lower extremity: Secondary | ICD-10-CM | POA: Diagnosis not present

## 2020-12-19 DIAGNOSIS — D352 Benign neoplasm of pituitary gland: Secondary | ICD-10-CM | POA: Diagnosis not present

## 2020-12-19 DIAGNOSIS — M4800 Spinal stenosis, site unspecified: Secondary | ICD-10-CM | POA: Diagnosis not present

## 2020-12-19 DIAGNOSIS — E039 Hypothyroidism, unspecified: Secondary | ICD-10-CM | POA: Diagnosis not present

## 2020-12-19 DIAGNOSIS — I2781 Cor pulmonale (chronic): Secondary | ICD-10-CM | POA: Diagnosis not present

## 2020-12-19 DIAGNOSIS — R1310 Dysphagia, unspecified: Secondary | ICD-10-CM | POA: Diagnosis not present

## 2020-12-19 DIAGNOSIS — Z7951 Long term (current) use of inhaled steroids: Secondary | ICD-10-CM | POA: Diagnosis not present

## 2020-12-19 DIAGNOSIS — I10 Essential (primary) hypertension: Secondary | ICD-10-CM | POA: Diagnosis not present

## 2020-12-20 DIAGNOSIS — Z7901 Long term (current) use of anticoagulants: Secondary | ICD-10-CM | POA: Diagnosis not present

## 2020-12-20 DIAGNOSIS — M1991 Primary osteoarthritis, unspecified site: Secondary | ICD-10-CM | POA: Diagnosis not present

## 2020-12-20 DIAGNOSIS — Z7951 Long term (current) use of inhaled steroids: Secondary | ICD-10-CM | POA: Diagnosis not present

## 2020-12-20 DIAGNOSIS — D352 Benign neoplasm of pituitary gland: Secondary | ICD-10-CM | POA: Diagnosis not present

## 2020-12-20 DIAGNOSIS — H409 Unspecified glaucoma: Secondary | ICD-10-CM | POA: Diagnosis not present

## 2020-12-20 DIAGNOSIS — I2781 Cor pulmonale (chronic): Secondary | ICD-10-CM | POA: Diagnosis not present

## 2020-12-20 DIAGNOSIS — I10 Essential (primary) hypertension: Secondary | ICD-10-CM | POA: Diagnosis not present

## 2020-12-20 DIAGNOSIS — I8390 Asymptomatic varicose veins of unspecified lower extremity: Secondary | ICD-10-CM | POA: Diagnosis not present

## 2020-12-20 DIAGNOSIS — R1310 Dysphagia, unspecified: Secondary | ICD-10-CM | POA: Diagnosis not present

## 2020-12-20 DIAGNOSIS — M4800 Spinal stenosis, site unspecified: Secondary | ICD-10-CM | POA: Diagnosis not present

## 2020-12-20 DIAGNOSIS — E039 Hypothyroidism, unspecified: Secondary | ICD-10-CM | POA: Diagnosis not present

## 2020-12-20 DIAGNOSIS — G2581 Restless legs syndrome: Secondary | ICD-10-CM | POA: Diagnosis not present

## 2020-12-20 DIAGNOSIS — G603 Idiopathic progressive neuropathy: Secondary | ICD-10-CM | POA: Diagnosis not present

## 2020-12-22 DIAGNOSIS — M1991 Primary osteoarthritis, unspecified site: Secondary | ICD-10-CM | POA: Diagnosis not present

## 2020-12-22 DIAGNOSIS — Z7901 Long term (current) use of anticoagulants: Secondary | ICD-10-CM | POA: Diagnosis not present

## 2020-12-22 DIAGNOSIS — R1310 Dysphagia, unspecified: Secondary | ICD-10-CM | POA: Diagnosis not present

## 2020-12-22 DIAGNOSIS — I2781 Cor pulmonale (chronic): Secondary | ICD-10-CM | POA: Diagnosis not present

## 2020-12-22 DIAGNOSIS — G2581 Restless legs syndrome: Secondary | ICD-10-CM | POA: Diagnosis not present

## 2020-12-22 DIAGNOSIS — D352 Benign neoplasm of pituitary gland: Secondary | ICD-10-CM | POA: Diagnosis not present

## 2020-12-22 DIAGNOSIS — Z7951 Long term (current) use of inhaled steroids: Secondary | ICD-10-CM | POA: Diagnosis not present

## 2020-12-22 DIAGNOSIS — E039 Hypothyroidism, unspecified: Secondary | ICD-10-CM | POA: Diagnosis not present

## 2020-12-22 DIAGNOSIS — I8390 Asymptomatic varicose veins of unspecified lower extremity: Secondary | ICD-10-CM | POA: Diagnosis not present

## 2020-12-22 DIAGNOSIS — H409 Unspecified glaucoma: Secondary | ICD-10-CM | POA: Diagnosis not present

## 2020-12-22 DIAGNOSIS — G603 Idiopathic progressive neuropathy: Secondary | ICD-10-CM | POA: Diagnosis not present

## 2020-12-22 DIAGNOSIS — M4800 Spinal stenosis, site unspecified: Secondary | ICD-10-CM | POA: Diagnosis not present

## 2020-12-22 DIAGNOSIS — I10 Essential (primary) hypertension: Secondary | ICD-10-CM | POA: Diagnosis not present

## 2020-12-25 DIAGNOSIS — E039 Hypothyroidism, unspecified: Secondary | ICD-10-CM | POA: Diagnosis not present

## 2020-12-25 DIAGNOSIS — G603 Idiopathic progressive neuropathy: Secondary | ICD-10-CM | POA: Diagnosis not present

## 2020-12-25 DIAGNOSIS — M4800 Spinal stenosis, site unspecified: Secondary | ICD-10-CM | POA: Diagnosis not present

## 2020-12-25 DIAGNOSIS — G2581 Restless legs syndrome: Secondary | ICD-10-CM | POA: Diagnosis not present

## 2020-12-25 DIAGNOSIS — Z7951 Long term (current) use of inhaled steroids: Secondary | ICD-10-CM | POA: Diagnosis not present

## 2020-12-25 DIAGNOSIS — R1310 Dysphagia, unspecified: Secondary | ICD-10-CM | POA: Diagnosis not present

## 2020-12-25 DIAGNOSIS — H409 Unspecified glaucoma: Secondary | ICD-10-CM | POA: Diagnosis not present

## 2020-12-25 DIAGNOSIS — I2781 Cor pulmonale (chronic): Secondary | ICD-10-CM | POA: Diagnosis not present

## 2020-12-25 DIAGNOSIS — Z7901 Long term (current) use of anticoagulants: Secondary | ICD-10-CM | POA: Diagnosis not present

## 2020-12-25 DIAGNOSIS — I8390 Asymptomatic varicose veins of unspecified lower extremity: Secondary | ICD-10-CM | POA: Diagnosis not present

## 2020-12-25 DIAGNOSIS — I10 Essential (primary) hypertension: Secondary | ICD-10-CM | POA: Diagnosis not present

## 2020-12-25 DIAGNOSIS — M1991 Primary osteoarthritis, unspecified site: Secondary | ICD-10-CM | POA: Diagnosis not present

## 2020-12-25 DIAGNOSIS — D352 Benign neoplasm of pituitary gland: Secondary | ICD-10-CM | POA: Diagnosis not present

## 2020-12-26 DIAGNOSIS — D352 Benign neoplasm of pituitary gland: Secondary | ICD-10-CM | POA: Diagnosis not present

## 2020-12-26 DIAGNOSIS — R1310 Dysphagia, unspecified: Secondary | ICD-10-CM | POA: Diagnosis not present

## 2020-12-26 DIAGNOSIS — E039 Hypothyroidism, unspecified: Secondary | ICD-10-CM | POA: Diagnosis not present

## 2020-12-26 DIAGNOSIS — I8390 Asymptomatic varicose veins of unspecified lower extremity: Secondary | ICD-10-CM | POA: Diagnosis not present

## 2020-12-26 DIAGNOSIS — G603 Idiopathic progressive neuropathy: Secondary | ICD-10-CM | POA: Diagnosis not present

## 2020-12-26 DIAGNOSIS — M1991 Primary osteoarthritis, unspecified site: Secondary | ICD-10-CM | POA: Diagnosis not present

## 2020-12-26 DIAGNOSIS — H409 Unspecified glaucoma: Secondary | ICD-10-CM | POA: Diagnosis not present

## 2020-12-26 DIAGNOSIS — M4800 Spinal stenosis, site unspecified: Secondary | ICD-10-CM | POA: Diagnosis not present

## 2020-12-26 DIAGNOSIS — G2581 Restless legs syndrome: Secondary | ICD-10-CM | POA: Diagnosis not present

## 2020-12-26 DIAGNOSIS — I10 Essential (primary) hypertension: Secondary | ICD-10-CM | POA: Diagnosis not present

## 2020-12-26 DIAGNOSIS — Z7901 Long term (current) use of anticoagulants: Secondary | ICD-10-CM | POA: Diagnosis not present

## 2020-12-26 DIAGNOSIS — Z7951 Long term (current) use of inhaled steroids: Secondary | ICD-10-CM | POA: Diagnosis not present

## 2020-12-26 DIAGNOSIS — I2781 Cor pulmonale (chronic): Secondary | ICD-10-CM | POA: Diagnosis not present

## 2020-12-28 DIAGNOSIS — E781 Pure hyperglyceridemia: Secondary | ICD-10-CM | POA: Diagnosis not present

## 2020-12-28 DIAGNOSIS — E7800 Pure hypercholesterolemia, unspecified: Secondary | ICD-10-CM | POA: Diagnosis not present

## 2020-12-28 DIAGNOSIS — J4530 Mild persistent asthma, uncomplicated: Secondary | ICD-10-CM | POA: Diagnosis not present

## 2020-12-28 DIAGNOSIS — E039 Hypothyroidism, unspecified: Secondary | ICD-10-CM | POA: Diagnosis not present

## 2020-12-28 DIAGNOSIS — I10 Essential (primary) hypertension: Secondary | ICD-10-CM | POA: Diagnosis not present

## 2020-12-29 DIAGNOSIS — G603 Idiopathic progressive neuropathy: Secondary | ICD-10-CM | POA: Diagnosis not present

## 2020-12-29 DIAGNOSIS — Z7951 Long term (current) use of inhaled steroids: Secondary | ICD-10-CM | POA: Diagnosis not present

## 2020-12-29 DIAGNOSIS — I8390 Asymptomatic varicose veins of unspecified lower extremity: Secondary | ICD-10-CM | POA: Diagnosis not present

## 2020-12-29 DIAGNOSIS — R1310 Dysphagia, unspecified: Secondary | ICD-10-CM | POA: Diagnosis not present

## 2020-12-29 DIAGNOSIS — H409 Unspecified glaucoma: Secondary | ICD-10-CM | POA: Diagnosis not present

## 2020-12-29 DIAGNOSIS — G2581 Restless legs syndrome: Secondary | ICD-10-CM | POA: Diagnosis not present

## 2020-12-29 DIAGNOSIS — M1991 Primary osteoarthritis, unspecified site: Secondary | ICD-10-CM | POA: Diagnosis not present

## 2020-12-29 DIAGNOSIS — D352 Benign neoplasm of pituitary gland: Secondary | ICD-10-CM | POA: Diagnosis not present

## 2020-12-29 DIAGNOSIS — E039 Hypothyroidism, unspecified: Secondary | ICD-10-CM | POA: Diagnosis not present

## 2020-12-29 DIAGNOSIS — M4800 Spinal stenosis, site unspecified: Secondary | ICD-10-CM | POA: Diagnosis not present

## 2020-12-29 DIAGNOSIS — I10 Essential (primary) hypertension: Secondary | ICD-10-CM | POA: Diagnosis not present

## 2020-12-29 DIAGNOSIS — Z7901 Long term (current) use of anticoagulants: Secondary | ICD-10-CM | POA: Diagnosis not present

## 2020-12-29 DIAGNOSIS — I2781 Cor pulmonale (chronic): Secondary | ICD-10-CM | POA: Diagnosis not present

## 2021-01-24 DIAGNOSIS — I10 Essential (primary) hypertension: Secondary | ICD-10-CM | POA: Diagnosis not present

## 2021-01-24 DIAGNOSIS — E781 Pure hyperglyceridemia: Secondary | ICD-10-CM | POA: Diagnosis not present

## 2021-01-24 DIAGNOSIS — E039 Hypothyroidism, unspecified: Secondary | ICD-10-CM | POA: Diagnosis not present

## 2021-01-24 DIAGNOSIS — J4530 Mild persistent asthma, uncomplicated: Secondary | ICD-10-CM | POA: Diagnosis not present

## 2021-01-24 DIAGNOSIS — E7800 Pure hypercholesterolemia, unspecified: Secondary | ICD-10-CM | POA: Diagnosis not present

## 2021-02-24 DIAGNOSIS — I10 Essential (primary) hypertension: Secondary | ICD-10-CM | POA: Diagnosis not present

## 2021-02-24 DIAGNOSIS — E7800 Pure hypercholesterolemia, unspecified: Secondary | ICD-10-CM | POA: Diagnosis not present

## 2021-02-24 DIAGNOSIS — E039 Hypothyroidism, unspecified: Secondary | ICD-10-CM | POA: Diagnosis not present

## 2021-02-24 DIAGNOSIS — E781 Pure hyperglyceridemia: Secondary | ICD-10-CM | POA: Diagnosis not present

## 2021-02-24 DIAGNOSIS — J4530 Mild persistent asthma, uncomplicated: Secondary | ICD-10-CM | POA: Diagnosis not present

## 2021-04-20 DIAGNOSIS — I10 Essential (primary) hypertension: Secondary | ICD-10-CM | POA: Diagnosis not present

## 2021-04-20 DIAGNOSIS — E781 Pure hyperglyceridemia: Secondary | ICD-10-CM | POA: Diagnosis not present

## 2021-04-20 DIAGNOSIS — E7800 Pure hypercholesterolemia, unspecified: Secondary | ICD-10-CM | POA: Diagnosis not present

## 2021-04-20 DIAGNOSIS — J4530 Mild persistent asthma, uncomplicated: Secondary | ICD-10-CM | POA: Diagnosis not present

## 2021-04-20 DIAGNOSIS — E039 Hypothyroidism, unspecified: Secondary | ICD-10-CM | POA: Diagnosis not present

## 2021-05-30 DIAGNOSIS — E781 Pure hyperglyceridemia: Secondary | ICD-10-CM | POA: Diagnosis not present

## 2021-05-30 DIAGNOSIS — J4530 Mild persistent asthma, uncomplicated: Secondary | ICD-10-CM | POA: Diagnosis not present

## 2021-05-30 DIAGNOSIS — I10 Essential (primary) hypertension: Secondary | ICD-10-CM | POA: Diagnosis not present

## 2021-05-30 DIAGNOSIS — E039 Hypothyroidism, unspecified: Secondary | ICD-10-CM | POA: Diagnosis not present

## 2021-05-30 DIAGNOSIS — E7800 Pure hypercholesterolemia, unspecified: Secondary | ICD-10-CM | POA: Diagnosis not present

## 2021-06-17 ENCOUNTER — Emergency Department (HOSPITAL_COMMUNITY): Payer: Medicare Other

## 2021-06-17 ENCOUNTER — Other Ambulatory Visit: Payer: Self-pay

## 2021-06-17 ENCOUNTER — Inpatient Hospital Stay (HOSPITAL_COMMUNITY)
Admission: EM | Admit: 2021-06-17 | Discharge: 2021-06-21 | DRG: 562 | Disposition: A | Discharge status: Home Health | Payer: Medicare Other | Attending: Internal Medicine | Admitting: Internal Medicine

## 2021-06-17 ENCOUNTER — Encounter (HOSPITAL_COMMUNITY): Payer: Self-pay | Admitting: Internal Medicine

## 2021-06-17 ENCOUNTER — Encounter: Payer: Self-pay | Admitting: Internal Medicine

## 2021-06-17 DIAGNOSIS — Z20822 Contact with and (suspected) exposure to covid-19: Secondary | ICD-10-CM | POA: Diagnosis not present

## 2021-06-17 DIAGNOSIS — I10 Essential (primary) hypertension: Secondary | ICD-10-CM | POA: Diagnosis not present

## 2021-06-17 DIAGNOSIS — R0689 Other abnormalities of breathing: Secondary | ICD-10-CM | POA: Diagnosis not present

## 2021-06-17 DIAGNOSIS — J449 Chronic obstructive pulmonary disease, unspecified: Secondary | ICD-10-CM | POA: Diagnosis not present

## 2021-06-17 DIAGNOSIS — S42354A Nondisplaced comminuted fracture of shaft of humerus, right arm, initial encounter for closed fracture: Principal | ICD-10-CM | POA: Diagnosis present

## 2021-06-17 DIAGNOSIS — Z043 Encounter for examination and observation following other accident: Secondary | ICD-10-CM | POA: Diagnosis not present

## 2021-06-17 DIAGNOSIS — R531 Weakness: Secondary | ICD-10-CM | POA: Diagnosis present

## 2021-06-17 DIAGNOSIS — W19XXXA Unspecified fall, initial encounter: Secondary | ICD-10-CM | POA: Diagnosis not present

## 2021-06-17 DIAGNOSIS — Z9181 History of falling: Secondary | ICD-10-CM | POA: Diagnosis not present

## 2021-06-17 DIAGNOSIS — E6609 Other obesity due to excess calories: Secondary | ICD-10-CM

## 2021-06-17 DIAGNOSIS — Z86711 Personal history of pulmonary embolism: Secondary | ICD-10-CM

## 2021-06-17 DIAGNOSIS — Z8612 Personal history of poliomyelitis: Secondary | ICD-10-CM | POA: Diagnosis not present

## 2021-06-17 DIAGNOSIS — Z87891 Personal history of nicotine dependence: Secondary | ICD-10-CM | POA: Diagnosis not present

## 2021-06-17 DIAGNOSIS — E669 Obesity, unspecified: Secondary | ICD-10-CM | POA: Diagnosis present

## 2021-06-17 DIAGNOSIS — M7989 Other specified soft tissue disorders: Secondary | ICD-10-CM | POA: Diagnosis not present

## 2021-06-17 DIAGNOSIS — G2581 Restless legs syndrome: Secondary | ICD-10-CM | POA: Diagnosis present

## 2021-06-17 DIAGNOSIS — Z86718 Personal history of other venous thrombosis and embolism: Secondary | ICD-10-CM

## 2021-06-17 DIAGNOSIS — S42351A Displaced comminuted fracture of shaft of humerus, right arm, initial encounter for closed fracture: Secondary | ICD-10-CM

## 2021-06-17 DIAGNOSIS — M25561 Pain in right knee: Secondary | ICD-10-CM | POA: Diagnosis not present

## 2021-06-17 DIAGNOSIS — M79631 Pain in right forearm: Secondary | ICD-10-CM | POA: Diagnosis not present

## 2021-06-17 DIAGNOSIS — R7889 Finding of other specified substances, not normally found in blood: Secondary | ICD-10-CM | POA: Diagnosis present

## 2021-06-17 DIAGNOSIS — R0902 Hypoxemia: Secondary | ICD-10-CM | POA: Diagnosis not present

## 2021-06-17 DIAGNOSIS — R9389 Abnormal findings on diagnostic imaging of other specified body structures: Secondary | ICD-10-CM

## 2021-06-17 DIAGNOSIS — W07XXXA Fall from chair, initial encounter: Secondary | ICD-10-CM | POA: Diagnosis not present

## 2021-06-17 DIAGNOSIS — Z6833 Body mass index (BMI) 33.0-33.9, adult: Secondary | ICD-10-CM

## 2021-06-17 DIAGNOSIS — R9431 Abnormal electrocardiogram [ECG] [EKG]: Secondary | ICD-10-CM | POA: Diagnosis not present

## 2021-06-17 DIAGNOSIS — E66811 Obesity, class 1: Secondary | ICD-10-CM

## 2021-06-17 DIAGNOSIS — J9811 Atelectasis: Secondary | ICD-10-CM | POA: Diagnosis not present

## 2021-06-17 DIAGNOSIS — R791 Abnormal coagulation profile: Secondary | ICD-10-CM | POA: Diagnosis not present

## 2021-06-17 DIAGNOSIS — Z8542 Personal history of malignant neoplasm of other parts of uterus: Secondary | ICD-10-CM

## 2021-06-17 DIAGNOSIS — E039 Hypothyroidism, unspecified: Secondary | ICD-10-CM

## 2021-06-17 DIAGNOSIS — Y92009 Unspecified place in unspecified non-institutional (private) residence as the place of occurrence of the external cause: Secondary | ICD-10-CM | POA: Diagnosis not present

## 2021-06-17 DIAGNOSIS — J984 Other disorders of lung: Secondary | ICD-10-CM | POA: Diagnosis not present

## 2021-06-17 DIAGNOSIS — M25562 Pain in left knee: Secondary | ICD-10-CM | POA: Diagnosis not present

## 2021-06-17 DIAGNOSIS — J9601 Acute respiratory failure with hypoxia: Secondary | ICD-10-CM | POA: Diagnosis not present

## 2021-06-17 DIAGNOSIS — Z7901 Long term (current) use of anticoagulants: Secondary | ICD-10-CM | POA: Diagnosis not present

## 2021-06-17 DIAGNOSIS — S0990XA Unspecified injury of head, initial encounter: Secondary | ICD-10-CM | POA: Diagnosis not present

## 2021-06-17 DIAGNOSIS — A809 Acute poliomyelitis, unspecified: Secondary | ICD-10-CM | POA: Insufficient documentation

## 2021-06-17 DIAGNOSIS — R0602 Shortness of breath: Secondary | ICD-10-CM | POA: Diagnosis not present

## 2021-06-17 DIAGNOSIS — Z743 Need for continuous supervision: Secondary | ICD-10-CM | POA: Diagnosis not present

## 2021-06-17 DIAGNOSIS — R6889 Other general symptoms and signs: Secondary | ICD-10-CM | POA: Diagnosis not present

## 2021-06-17 HISTORY — DX: Chronic obstructive pulmonary disease, unspecified: J44.9

## 2021-06-17 HISTORY — DX: Restless legs syndrome: G25.81

## 2021-06-17 HISTORY — DX: Acute embolism and thrombosis of unspecified vein: I82.90

## 2021-06-17 HISTORY — DX: Essential (primary) hypertension: I10

## 2021-06-17 HISTORY — DX: Acute poliomyelitis, unspecified: A80.9

## 2021-06-17 HISTORY — DX: Malignant neoplasm of uterus, part unspecified: C55

## 2021-06-17 HISTORY — DX: Hypothyroidism, unspecified: E03.9

## 2021-06-17 HISTORY — DX: Other obesity due to excess calories: E66.09

## 2021-06-17 HISTORY — DX: Other obesity due to excess calories: Z68.33

## 2021-06-17 LAB — CBC WITH DIFFERENTIAL/PLATELET
Abs Immature Granulocytes: 0.06 10*3/uL (ref 0.00–0.07)
Basophils Absolute: 0 10*3/uL (ref 0.0–0.1)
Basophils Relative: 0 %
Eosinophils Absolute: 0.1 10*3/uL (ref 0.0–0.5)
Eosinophils Relative: 1 %
HCT: 40.9 % (ref 36.0–46.0)
Hemoglobin: 12.4 g/dL (ref 12.0–15.0)
Immature Granulocytes: 1 %
Lymphocytes Relative: 43 %
Lymphs Abs: 3.9 10*3/uL (ref 0.7–4.0)
MCH: 31.6 pg (ref 26.0–34.0)
MCHC: 30.3 g/dL (ref 30.0–36.0)
MCV: 104.3 fL — ABNORMAL HIGH (ref 80.0–100.0)
Monocytes Absolute: 0.5 10*3/uL (ref 0.1–1.0)
Monocytes Relative: 6 %
Neutro Abs: 4.5 10*3/uL (ref 1.7–7.7)
Neutrophils Relative %: 49 %
Platelets: 274 10*3/uL (ref 150–400)
RBC: 3.92 MIL/uL (ref 3.87–5.11)
RDW: 15.5 % (ref 11.5–15.5)
WBC: 9.1 10*3/uL (ref 4.0–10.5)
nRBC: 0 % (ref 0.0–0.2)

## 2021-06-17 LAB — COMPREHENSIVE METABOLIC PANEL
ALT: 11 U/L (ref 0–44)
AST: 18 U/L (ref 15–41)
Albumin: 2.9 g/dL — ABNORMAL LOW (ref 3.5–5.0)
Alkaline Phosphatase: 99 U/L (ref 38–126)
Anion gap: 7 (ref 5–15)
BUN: 35 mg/dL — ABNORMAL HIGH (ref 8–23)
CO2: 34 mmol/L — ABNORMAL HIGH (ref 22–32)
Calcium: 8.2 mg/dL — ABNORMAL LOW (ref 8.9–10.3)
Chloride: 97 mmol/L — ABNORMAL LOW (ref 98–111)
Creatinine, Ser: 1.4 mg/dL — ABNORMAL HIGH (ref 0.44–1.00)
GFR, Estimated: 35 mL/min — ABNORMAL LOW (ref >60)
Glucose, Bld: 104 mg/dL — ABNORMAL HIGH (ref 70–99)
Potassium: 4.3 mmol/L (ref 3.5–5.1)
Sodium: 138 mmol/L (ref 135–145)
Total Bilirubin: 0.3 mg/dL (ref 0.3–1.2)
Total Protein: 6 g/dL — ABNORMAL LOW (ref 6.5–8.1)

## 2021-06-17 LAB — PROTIME-INR
INR: >10 (ref 0.8–1.2)
INR: >10 (ref 0.8–1.2)
Prothrombin Time: 85.3 seconds — ABNORMAL HIGH (ref 11.4–15.2)
Prothrombin Time: 83.1 seconds — ABNORMAL HIGH (ref 11.4–15.2)

## 2021-06-17 LAB — RESP PANEL BY RT-PCR (FLU A&B, COVID) ARPGX2
Influenza A by PCR: NEGATIVE
Influenza B by PCR: NEGATIVE
SARS Coronavirus 2 by RT PCR: NEGATIVE

## 2021-06-17 LAB — LACTIC ACID, PLASMA
Lactic Acid, Venous: 0.9 mmol/L (ref 0.5–1.9)
Lactic Acid, Venous: 1 mmol/L (ref 0.5–1.9)

## 2021-06-17 LAB — BRAIN NATRIURETIC PEPTIDE: B Natriuretic Peptide: 42.9 pg/mL (ref 0.0–100.0)

## 2021-06-17 LAB — TROPONIN I (HIGH SENSITIVITY)
Troponin I (High Sensitivity): 6 ng/L (ref <18)
Troponin I (High Sensitivity): 7 ng/L (ref <18)

## 2021-06-17 MED ORDER — SODIUM CHLORIDE 0.9 % IV SOLN
1.0000 g | Freq: Once | INTRAVENOUS | Status: AC
  Administered 2021-06-17: 1 g via INTRAVENOUS
  Filled 2021-06-17: qty 10

## 2021-06-17 MED ORDER — ACETAMINOPHEN 650 MG RE SUPP: 650.0000 mg | Freq: Four times a day (QID) | RECTAL | Status: DC | PRN

## 2021-06-17 MED ORDER — BISACODYL 5 MG PO TBEC: 5.0000 mg | DELAYED_RELEASE_TABLET | Freq: Every day | ORAL | Status: DC | PRN

## 2021-06-17 MED ORDER — POLYETHYLENE GLYCOL 3350 17 G PO PACK
17.0000 g | PACK | Freq: Every day | ORAL | Status: DC | PRN
Start: 2021-06-17 — End: 2021-06-22

## 2021-06-17 MED ORDER — SODIUM CHLORIDE 0.9% FLUSH
3.0000 mL | Freq: Two times a day (BID) | INTRAVENOUS | Status: DC
  Administered 2021-06-18 – 2021-06-20 (×4): 3 mL via INTRAVENOUS

## 2021-06-17 MED ORDER — SODIUM CHLORIDE 0.9 % IV SOLN
500.0000 mg | Freq: Once | INTRAVENOUS | Status: AC
  Administered 2021-06-17: 500 mg via INTRAVENOUS
  Filled 2021-06-17: qty 500

## 2021-06-17 MED ORDER — ONDANSETRON HCL 4 MG PO TABS: 4.0000 mg | ORAL_TABLET | Freq: Four times a day (QID) | ORAL | Status: DC | PRN

## 2021-06-17 MED ORDER — HYDROCODONE-ACETAMINOPHEN 5-325 MG PO TABS
1.0000 | ORAL_TABLET | ORAL | Status: DC | PRN
  Administered 2021-06-18 – 2021-06-19 (×2): 1 via ORAL
  Filled 2021-06-17 (×2): qty 1

## 2021-06-17 MED ORDER — HYDRALAZINE HCL 20 MG/ML IJ SOLN: 5.0000 mg | INTRAMUSCULAR | Status: DC | PRN

## 2021-06-17 MED ORDER — MORPHINE SULFATE (PF) 2 MG/ML IV SOLN
2.0000 mg | INTRAVENOUS | Status: DC | PRN
  Administered 2021-06-17: 2 mg via INTRAVENOUS
  Filled 2021-06-17: qty 1

## 2021-06-17 MED ORDER — LACTATED RINGERS IV SOLN: INTRAVENOUS | Status: DC

## 2021-06-17 MED ORDER — ACETAMINOPHEN 325 MG PO TABS: 650.0000 mg | ORAL_TABLET | Freq: Four times a day (QID) | ORAL | Status: DC | PRN

## 2021-06-17 MED ORDER — DOCUSATE SODIUM 100 MG PO CAPS
100.0000 mg | ORAL_CAPSULE | Freq: Two times a day (BID) | ORAL | Status: DC
  Administered 2021-06-17 – 2021-06-21 (×8): 100 mg via ORAL
  Filled 2021-06-17 (×8): qty 1

## 2021-06-17 MED ORDER — VITAMIN K1 10 MG/ML IJ SOLN
5.0000 mg | Freq: Once | INTRAVENOUS | Status: AC
  Administered 2021-06-17: 5 mg via INTRAVENOUS
  Filled 2021-06-17: qty 0.5

## 2021-06-17 MED ORDER — ONDANSETRON HCL 4 MG/2ML IJ SOLN: 4.0000 mg | Freq: Four times a day (QID) | INTRAMUSCULAR | Status: DC | PRN

## 2021-06-17 NOTE — ED Notes (Signed)
Bartolo Darter (son) update given [phone]. He states that she has not been walking well for the last 3 days, which is not her normal. He hasn't been able to get her out of the house to get her Warfarin levels checked. Last Pro time was greater than 10

## 2021-06-17 NOTE — ED Notes (Signed)
Trauma Response Nurse Note-  Reason for Call / Reason for Trauma activation:   - L2 fall on thinners R arm deformity  Initial Focused Assessment (If applicable, or please see trauma documentation):  - R humerus deformity w/ pain - Pt alert and oriented  - pain all over per pt - O2 sat 75% on RA  Interventions:  - Xrays to R arm, chest, pelvis and bil knees - 20G IV to L AC - Labs drawn - Pt placed on 3L O2 via [place]  Plan of Care as of this note:  - Awaiting Xray results - Ortho consult  Event Summary:   - Pt was attempting to transfer from her chair with the help of her son.  She started falling and the son was unable to catch her resulting in her falling on her arm.  C/O R arm pain and also pain all over.  Pt also takes a daily blood thinner.  Call [place], TRN if you have any questions.  [phone]

## 2021-06-17 NOTE — Consult Note (Signed)
Reason for Consult: right closed humerus fracture Referring Physician: Lorin Mercy, MD (Hospitalist)  Crystal [name] is an 87 y.o. female.  HPI: Crystal [name] is a 87 y.o. 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  She reports that she was feeling fine today but fell when trying to get up out of her chair.  She injured her R arm.  She does not think she lost consciousness.  No apparent bleeding.  Past Medical History:  Diagnosis Date   Class 1 obesity due to excess calories with body mass index (BMI) of 33.0 to 33.9 in adult 06/17/2021   COPD (chronic obstructive pulmonary disease) (HCC)    Hypertension    Hypothyroidism (acquired)    Polio    residual RUE weakness   Restless leg syndrome    Uterine cancer (HCC)    remote   VTE (venous thromboembolism)    lifelong AC    History reviewed. No pertinent surgical history.  History reviewed. No pertinent family history.  Social History:  reports that she has quit smoking. Her smoking use included cigarettes. She has never used smokeless tobacco. She reports previous alcohol use. She reports that she does not use drugs.  Allergies: Not on File  Medications: I have reviewed the patient's current medications. Scheduled:  docusate sodium  100 mg Oral BID   sodium chloride flush  3 mL Intravenous Q12H    Results for orders placed or performed during the hospital encounter of 06/17/21 (from the past 24 hour(s))  CBC with Differential     Status: Abnormal   Collection Time: 06/17/21 12:16 PM  Result Value Ref Range   WBC 9.1 4.0 - 10.5 K/uL   RBC 3.92 3.87 - 5.11 MIL/uL   Hemoglobin 12.4 12.0 - 15.0 g/dL   HCT 40.9 36.0 - 46.0 %   MCV 104.3 (H) 80.0 - 100.0 fL   MCH 31.6 26.0 - 34.0 pg   MCHC 30.3 30.0 - 36.0 g/dL   RDW 15.5 11.5 - 15.5 %   Platelets 274 150 - 400 K/uL   nRBC 0.0 0.0 - 0.2 %   Neutrophils Relative % 49 %    Neutro Abs 4.5 1.7 - 7.7 K/uL   Lymphocytes Relative 43 %   Lymphs Abs 3.9 0.7 - 4.0 K/uL   Monocytes Relative 6 %   Monocytes Absolute 0.5 0.1 - 1.0 K/uL   Eosinophils Relative 1 %   Eosinophils Absolute 0.1 0.0 - 0.5 K/uL   Basophils Relative 0 %   Basophils Absolute 0.0 0.0 - 0.1 K/uL   Immature Granulocytes 1 %   Abs Immature Granulocytes 0.06 0.00 - 0.07 K/uL  Comprehensive metabolic panel     Status: Abnormal   Collection Time: 06/17/21 12:16 PM  Result Value Ref Range   Sodium 138 135 - 145 mmol/L   Potassium 4.3 3.5 - 5.1 mmol/L   Chloride 97 (L) 98 - 111 mmol/L   CO2 34 (H) 22 - 32 mmol/L   Glucose, Bld 104 (H) 70 - 99 mg/dL   BUN 35 (H) 8 - 23 mg/dL   Creatinine, Ser 1.40 (H) 0.44 - 1.00 mg/dL   Calcium 8.2 (L) 8.9 - 10.3 mg/dL   Total Protein 6.0 (L) 6.5 - 8.1 g/dL   Albumin 2.9 (L) 3.5 - 5.0 g/dL   AST 18 15 - 41 U/L   ALT 11 0 - 44 U/L   Alkaline  Phosphatase 99 38 - 126 U/L   Total Bilirubin 0.3 0.3 - 1.2 mg/dL   GFR, Estimated 35 (L) >60 mL/min   Anion gap 7 5 - 15  Protime-INR     Status: Abnormal   Collection Time: 06/17/21 12:16 PM  Result Value Ref Range   Prothrombin Time 85.3 (H) 11.4 - 15.2 seconds   INR >10.0 (HH) 0.8 - 1.2  Troponin I (High Sensitivity)     Status: None   Collection Time: 06/17/21 12:16 PM  Result Value Ref Range   Troponin I (High Sensitivity) 6 <18 ng/L  Lactic acid, plasma     Status: None   Collection Time: 06/17/21 12:17 PM  Result Value Ref Range   Lactic Acid, Venous 0.9 0.5 - 1.9 mmol/L  Brain natriuretic peptide     Status: None   Collection Time: 06/17/21 12:17 PM  Result Value Ref Range   B Natriuretic Peptide 42.9 0.0 - 100.0 pg/mL  Resp Panel by RT-PCR (Flu A&B, Covid) Nasopharyngeal Swab     Status: None   Collection Time: 06/17/21 12:19 PM   Specimen: Nasopharyngeal Swab; Nasopharyngeal(NP) swabs in vial transport medium  Result Value Ref Range   SARS Coronavirus 2 by RT PCR NEGATIVE NEGATIVE   Influenza A by  PCR NEGATIVE NEGATIVE   Influenza B by PCR NEGATIVE NEGATIVE  Lactic acid, plasma     Status: None   Collection Time: 06/17/21  2:20 PM  Result Value Ref Range   Lactic Acid, Venous 1.0 0.5 - 1.9 mmol/L  Troponin I (High Sensitivity)     Status: None   Collection Time: 06/17/21  2:20 PM  Result Value Ref Range   Troponin I (High Sensitivity) 7 <18 ng/L  Protime-INR     Status: Abnormal   Collection Time: 06/17/21  2:20 PM  Result Value Ref Range   Prothrombin Time 83.1 (H) 11.4 - 15.2 seconds   INR >10.0 (HH) 0.8 - 1.2     X-ray: CLINICAL DATA:  Fall.   EXAM: RIGHT HUMERUS - 2+ VIEW   COMPARISON:  None.   FINDINGS: Acute comminuted fracture of the mid humeral diaphysis with mild apex anterior angulation. There is a 2.6 cm butterfly fragment. The dominant fracture fragments demonstrate minimal posterior displacement and mild overriding measuring 8 mm. The right shoulder and elbow are grossly intact. Osteopenia. Soft tissues are unremarkable.   IMPRESSION: 1. Acute comminuted fracture of the mid humeral diaphysis.     Electronically Signed   By: Titus Dubin M.D.  ROS: As per HPI; otherwise review of systems reviewed and negative.  Blood pressure (!) 122/49, pulse 76, temperature (!) 97.5 F (36.4 C), resp. rate 15, height 5\' 6"  (1.676 m), weight 94.2 kg, SpO2 96 %.  Physical Exam:  General:  Appears calm and comfortable and is in NAD Eyes:  PERRL, EOMI, normal lids, iris ENT:   hard of hearing, grossly normal lips & tongue, mmm Neck:  no LAD, masses or thyromegaly Cardiovascular:  RRR, no m/r/g. 2+ LE edema - patient reports chronic and unchanged. Respiratory:   CTA bilaterally with no wheezes/rales/rhonchi.  Normal respiratory effort. Abdomen:  soft, NT, ND Skin:  no rash or induration seen on limited exam Musculoskeletal:  Right UE with decreased muscle tone (atrophy related to Polio) with limited functional right hand.  No gross arm  defromity Psychiatric:  grossly normal mood and affect, speech fluent and appropriate, AOx2 Neurologic:  CN 2-12 grossly intact  Assessment/Plan: Minimally displaced closed comminuted  mid shaft humerus fracture in RUE affected by Polio  Plan: Non surgical management Sling for now I will order a [place]iento clam shell brace through [employer] to be fitted tomorrow NWB RUE Appropriate pain control May maintain anticoagulation as no surgery planned  Mauri Pole 06/17/2021, 9:38 PM

## 2021-06-17 NOTE — Progress Notes (Signed)
Orthopedic Tech Progress Note Patient Details:  Crystal [name] Jul 02, 1925 [id number] Coaptation splint was unsuccessful due to right side weakness and pt tugging arm out of correct position with dominant arm. Informed RN and provider.  Patient ID: Crystal [name], female   DOB: Jul 16, 1925, 87 y.o.   MRN: [id number]  Petra Kuba 06/17/2021, 6:52 PM

## 2021-06-17 NOTE — ED Triage Notes (Signed)
Fall coming from home. EMS states that her legs gave out and fell. C/o right arm pain, noted deformity. Denies LOC. Patient is on a daily blood thinner. Lives with son

## 2021-06-17 NOTE — ED Notes (Signed)
The ortho tech will be by shortly to splint the arm.

## 2021-06-17 NOTE — ED Provider Notes (Addendum)
[facility] EMERGENCY DEPARTMENT Provider Note   CSN: [id number] Arrival date & time: 06/17/21  1210     History Chief Complaint  Patient presents with   Crystal [name] is a 87 y.o. female.  Presents to ER as level 2 trauma.  Fall on blood thinners.  Per report, this was a witnessed fall while patient was getting up from her chair with help of son.  Patient fell and hit the right side of her body, struck her right arm.  There was no head trauma.  Patient did not lose consciousness.  Patient having obvious right arm deformity.  Patient reports that she has baseline weakness in her right arm due to a remote history of polio.  Pain improved after receiving the 150 mcg of fentanyl.  HPI     No past medical history on file.  There are no problems to display for this patient.    OB History   No obstetric history on file.     No family history on file.     Home Medications Prior to Admission medications   Not on File    Allergies    Patient has no allergy information on record.  Review of Systems   Review of Systems  Constitutional:  Positive for fatigue. Negative for chills and fever.  HENT:  Negative for ear pain and sore throat.   Eyes:  Negative for pain and visual disturbance.  Respiratory:  Positive for shortness of breath. Negative for cough.   Cardiovascular:  Negative for chest pain and palpitations.  Gastrointestinal:  Negative for abdominal pain and vomiting.  Genitourinary:  Negative for dysuria and hematuria.  Musculoskeletal:  Positive for arthralgias. Negative for back pain.  Skin:  Negative for color change and rash.  Neurological:  Negative for seizures and syncope.  All other systems reviewed and are negative.  Physical Exam Updated Vital Signs BP (!) 112/46   Pulse 87   Temp (!) 97.4 F (36.3 C) (Oral)   Resp 17   Ht 5\' 6"  (1.676 m)   Wt 94.2 kg   SpO2 97%   BMI 33.52 kg/m   Physical Exam Vitals and nursing  note reviewed.  Constitutional:      General: She is not in acute distress.    Appearance: She is well-developed.  HENT:     Head: Normocephalic and atraumatic.  Eyes:     Conjunctiva/sclera: Conjunctivae normal.  Cardiovascular:     Rate and Rhythm: Normal rate and regular rhythm.     Heart sounds: No murmur heard. Pulmonary:     Effort: Pulmonary effort is normal. No respiratory distress.     Breath sounds: Normal breath sounds.  Abdominal:     Palpations: Abdomen is soft.     Tenderness: There is no abdominal tenderness.  Musculoskeletal:     Cervical back: Normal range of motion and neck supple. No rigidity or tenderness.     Comments: Right upper extremity: There is obvious deformity to the right upper arm, tenderness over her upper arm, range of motion decreased due to pain, radial pulse intact, sensation intact, baseline weakness in right wrist and hand LUE: no TTP throughout RLE: no TTP throughout LLE: no TTP throughout Back: no C, T or L spine TTP  Skin:    General: Skin is warm and dry.  Neurological:     General: No focal deficit present.     Mental Status: She is alert.  ED Results / Procedures / Treatments   Labs (all labs ordered are listed, but only abnormal results are displayed) Labs Reviewed  CBC WITH DIFFERENTIAL/PLATELET - Abnormal; Notable for the following components:      Result Value   MCV 104.3 (*)    All other components within normal limits  COMPREHENSIVE METABOLIC PANEL - Abnormal; Notable for the following components:   Chloride 97 (*)    CO2 34 (*)    Glucose, Bld 104 (*)    BUN 35 (*)    Creatinine, Ser 1.40 (*)    Calcium 8.2 (*)    Total Protein 6.0 (*)    Albumin 2.9 (*)    GFR, Estimated 35 (*)    All other components within normal limits  PROTIME-INR - Abnormal; Notable for the following components:   Prothrombin Time 85.3 (*)    INR >10.0 (*)    All other components within normal limits  RESP PANEL BY RT-PCR (FLU A&B, COVID)  ARPGX2  LACTIC ACID, PLASMA  BRAIN NATRIURETIC PEPTIDE  LACTIC ACID, PLASMA  PROTIME-INR  TROPONIN I (HIGH SENSITIVITY)  TROPONIN I (HIGH SENSITIVITY)    EKG EKG Interpretation  Date/Time:  Sunday June 17 2021 13:52:43 EDT Ventricular Rate:  82 PR Interval:  214 QRS Duration: 96 QT Interval:  542 QTC Calculation: 634 R Axis:   -2 Text Interpretation: Sinus rhythm Borderline prolonged PR interval Low voltage, precordial leads Borderline T wave abnormalities Prolonged QT interval Confirmed by Madalyn Rob [phone]) on 06/17/2021 3:00:51 PM  Radiology DG Pelvis 1-2 Views  Result Date: 06/17/2021 CLINICAL DATA:  Acute pelvic pain following fall. Initial encounter. EXAM: PELVIS - 1-2 VIEW COMPARISON:  None. FINDINGS: No acute fracture or dislocation identified. No focal bony lesions are present. Surgical changes within the lumbar spine are present. IMPRESSION: No acute abnormality. Electronically Signed   By: Margarette Canada M.D.   On: 06/17/2021 13:18   DG Forearm Right  Result Date: 06/17/2021 CLINICAL DATA:  Acute RIGHT forearm pain following fall. Initial encounter. EXAM: RIGHT FOREARM - 2 VIEW COMPARISON:  None. FINDINGS: No acute fracture, subluxation or dislocation identified. Diffuse osteopenia is noted. Heavy vascular calcifications are present. IMPRESSION: 1. No acute bony abnormality. 2. Diffuse osteopenia. Electronically Signed   By: Margarette Canada M.D.   On: 06/17/2021 13:17   DG Wrist Complete Right  Result Date: 06/17/2021 CLINICAL DATA:  Fall. EXAM: RIGHT WRIST - COMPLETE 3+ VIEW COMPARISON:  None. FINDINGS: No distal radius fracture. No carpal bone fracture. The radiocarpal joint is intact. Abnormal articulation of the radius and ulna with the shortened ulna approximating the radial head. Extensive vascular calcifications. IMPRESSION: 1. No acute fracture of the wrist. 2. 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 Electronically Signed   By: Suzy Bouchard M.D.   On: 06/17/2021 13:24   DG Chest Portable 1 View  Result Date: 06/17/2021 CLINICAL DATA:  Fall. EXAM: PORTABLE CHEST 1 VIEW COMPARISON:  Chest x-ray dated March 08, 2018. FINDINGS: The patient is rotated to the right. Stable cardiomediastinal silhouette. New reticulonodular densities in the right mid lung. No focal consolidation, pleural effusion, or pneumothorax. No acute osseous abnormality. IMPRESSION: 1. New reticulonodular densities in the right mid lung, suspicious for bronchiolitis/atypical infection. Electronically Signed   By: Titus Dubin M.D.   On: 06/17/2021 12:47   DG Knee Complete 4 Views Left  Result Date: 06/17/2021 CLINICAL DATA:  Knee pain after fall. EXAM: LEFT KNEE - COMPLETE 4+ VIEW COMPARISON:  May 10, 2005 FINDINGS: No evidence of fracture, dislocation, or joint effusion. Intact 3 component left knee arthroplasty. Vascular calcifications noted.  Mild diffuse soft tissue swelling. IMPRESSION: 1. No acute fracture or dislocation identified about the left knee. 2. Mild diffuse soft tissue swelling. 3. Intact 3 component left knee arthroplasty. Electronically Signed   By: Fidela Salisbury M.D.   On: 06/17/2021 13:16   DG Knee Complete 4 Views Right  Result Date: 06/17/2021 CLINICAL DATA:  Acute RIGHT knee pain following fall. Initial encounter. EXAM: RIGHT KNEE - COMPLETE 4+ VIEW COMPARISON:  04/13/2004 FINDINGS: Total knee arthroplasty changes noted. No acute fracture, dislocation or joint effusion noted. No hardware complicating features are noted. IMPRESSION: No acute abnormality. Electronically Signed   By: Margarette Canada M.D.   On: 06/17/2021 13:16   DG Humerus Right  Result Date: 06/17/2021 CLINICAL DATA:  Fall. EXAM: RIGHT HUMERUS - 2+ VIEW COMPARISON:  None. FINDINGS: Acute comminuted fracture of the mid humeral diaphysis with mild apex anterior angulation. There is a 2.6 cm butterfly fragment. The dominant fracture fragments demonstrate minimal posterior  displacement and mild overriding measuring 8 mm. The right shoulder and elbow are grossly intact. Osteopenia. Soft tissues are unremarkable. IMPRESSION: 1. Acute comminuted fracture of the mid humeral diaphysis. Electronically Signed   By: Titus Dubin M.D.   On: 06/17/2021 12:49    Procedures Procedures   Medications Ordered in ED Medications  azithromycin (ZITHROMAX) 500 mg in sodium chloride 0.9 % 250 mL IVPB (500 mg Intravenous New Bag/Given 06/17/21 1407)  cefTRIAXone (ROCEPHIN) 1 g in sodium chloride 0.9 % 100 mL IVPB (0 g Intravenous Stopped 06/17/21 1430)    ED Course  I have reviewed the triage vital signs and the nursing notes.  Pertinent labs & imaging results that were available during my care of the patient were reviewed by me and considered in my medical decision making (see chart for details).    MDM Rules/Calculators/A&P                          87 year old lady presents to ER with concern for fall and right arm deformity.  There was no reported head trauma, patient denies hitting her head and denies loss of consciousness.  No apparent trauma on careful inspection of her face and head.  There is an obvious deformity to her right upper arm.  She has baseline right wrist weakness.  Plain films concerning for comminuted fracture of the mid humerus.  Discussed this with Dr. Ihor Gully on-call for orthopedics.  They will see as consult.  He recommends sling for now, they will arrange for a specialized brace for her arm, does not need coapt at present.  Patient was noted to be hypoxic, 75% on room air here.  She denied any specific respiratory complaints but her chest x-ray was concerning for new reticulonodular densities in the lung concerning for bronchiolitis or atypical infection.  Will treat for pneumonia with ceftriaxone and azithromycin.  COVID test negative.  Basic labs noted for very supratherapeutic INR.  Greater than 10.  Will repeat INR.  Patient will need admission for  pneumonia, hypoxia anyways.  Will defer to admitting team regarding management of her anticoagulation.  Consulted [facility] for admit.  D/w Lorin Mercy. She will admit. While there is no definite trauma by exam or history, given the hypoxia and profound elevation in INR, will check a CT head to make sure she does not have an intracranial bleed.  Final Clinical Impression(s) / ED Diagnoses Final diagnoses:  Closed nondisplaced comminuted fracture of shaft of right humerus, initial encounter  Supratherapeutic INR  Acute respiratory failure with hypoxia [facility])    Rx / DC Orders ED Discharge Orders     None        Lucrezia Starch, MD 06/17/21 1501    Lucrezia Starch, MD 06/17/21 1523

## 2021-06-17 NOTE — ED Notes (Signed)
Floor nurse staff are not taking report at this time.

## 2021-06-17 NOTE — Progress Notes (Signed)
Orthopedic Tech Progress Note Patient Details:  [name] [birth year] [id number] Trauma Level 2. Applied arm sling to RUE. Awaiting further orders based on xrays. Ortho Devices Type of Ortho Device: Arm sling Ortho Device/Splint Location: RUE Ortho Device/Splint Interventions: Application   Post Interventions Patient Tolerated: Fair Instructions Provided: Adjustment of device  [name] 06/17/2021, 1:04 PM

## 2021-06-17 NOTE — ED Notes (Signed)
Ortho splint was not successful due to patients right arm weakness. She will inform the doctor at this time

## 2021-06-17 NOTE — Plan of Care (Signed)
  Problem: Education: Goal: Knowledge of General Education information will improve Description: Including pain rating scale, medication(s)/side effects and non-pharmacologic comfort measures Outcome: Progressing   Problem: Health Behavior/Discharge Planning: Goal: Ability to manage health-related needs will improve Outcome: Progressing   Problem: Clinical Measurements: Goal: Will remain free from infection Outcome: Progressing   

## 2021-06-17 NOTE — ED Notes (Signed)
Report given to joyce rn on 5n

## 2021-06-17 NOTE — H&P (Signed)
History and Physical    Crystal [name]:[id number] DOB: 01-16-1925 DOA: 06/17/2021  PCP: Lajean Manes, MD Consultants:  None Patient coming from:  Home - lives with son; NOK:  son, Bartolo Darter, [phone]  Chief Complaint: Crystal [name]  HPI: Crystal [name] is a 87 y.o. 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  She reports that she was feeling fine today but fell when trying to get up out of her chair.  She injured her R arm.  She does not think she lost consciousness.  No apparent bleeding.    ED Course: On Coumadin for DVT/PE.  Presented with witnessed fall - only injury is R humerus injury.  No head CT done, did not hit head.  Incidentally hypoxic, 70%, on 3L.  CXR with atypical PNA vs. Bronchiolitis.  COVID negative.  No respiratory symptoms.  INR >10, repeating.  Will get head CT.  Dr. Alvan Dame thinks non-operative, will consult.  Will order a brace, sling ok for now.  Review of Systems: As per HPI; otherwise review of systems reviewed and negative.   Ambulatory Status:  Ambulates with a walker, not well  COVID Vaccine Status:  None  Past Medical History:  Diagnosis Date   Class 1 obesity due to excess calories with body mass index (BMI) of 33.0 to 33.9 in adult 06/17/2021   COPD (chronic obstructive pulmonary disease) (HCC)    Hypertension    Hypothyroidism (acquired)    Polio    residual RUE weakness   Restless leg syndrome    Uterine cancer (HCC)    remote   VTE (venous thromboembolism)    lifelong AC    History reviewed. No pertinent surgical history.  Social History   Socioeconomic History   Marital status: Unknown    Spouse name: Not on file   Number of children: Not on file   Years of education: Not on file   Highest education level: Not on file  Occupational History   Not on file  Tobacco Use   Smoking status: Former    Types: Cigarettes    Smokeless tobacco: Never  Substance and Sexual Activity   Alcohol use: Not Currently   Drug use: Never   Sexual activity: Not on file  Other Topics Concern   Not on file  Social History Narrative   Not on file   Social Determinants of Health   Financial Resource Strain: Not on file  Food Insecurity: Not on file  Transportation Needs: Not on file  Physical Activity: Not on file  Stress: Not on file  Social Connections: Not on file  Intimate Partner Violence: Not on file    Not on File  History reviewed. No pertinent family history.  Prior to Admission medications   Not on File    Physical Exam: Vitals:   06/17/21 1515 06/17/21 1530 06/17/21 1600 06/17/21 1731  BP: (!) 100/37 (!) 97/37 123/90 (!) 122/97  Pulse: 80 77 79 78  Resp: (!) 21 17 20 20   Temp:      TempSrc:      SpO2: 97% 97% 98% 98%  Weight:      Height:         General:  Appears calm and comfortable and is in NAD Eyes:  PERRL, EOMI, normal lids, iris ENT:   hard of hearing, grossly normal lips & tongue, mmm Neck:  no LAD, masses or thyromegaly Cardiovascular:  RRR,  no m/r/g. 2+ LE edema - patient reports chronic and unchanged.  Respiratory:   CTA bilaterally with no wheezes/rales/rhonchi.  Normal respiratory effort. Abdomen:  soft, NT, ND Skin:  no rash or induration seen on limited exam Musculoskeletal:  grossly normal tone BLE, RUE is in sling Psychiatric:  grossly normal mood and affect, speech fluent and appropriate, AOx2 Neurologic:  CN 2-12 grossly intact    Radiological Exams on Admission: Independently reviewed - see discussion in A/P where applicable  DG Pelvis 1-2 Views  Result Date: 06/17/2021 CLINICAL DATA:  Acute pelvic pain following fall. Initial encounter. EXAM: PELVIS - 1-2 VIEW COMPARISON:  None. FINDINGS: No acute fracture or dislocation identified. No focal bony lesions are present. Surgical changes within the lumbar spine are present. IMPRESSION: No acute abnormality.  Electronically Signed   By: Margarette Canada M.D.   On: 06/17/2021 13:18   DG Forearm Right  Result Date: 06/17/2021 CLINICAL DATA:  Acute RIGHT forearm pain following fall. Initial encounter. EXAM: RIGHT FOREARM - 2 VIEW COMPARISON:  None. FINDINGS: No acute fracture, subluxation or dislocation identified. Diffuse osteopenia is noted. Heavy vascular calcifications are present. IMPRESSION: 1. No acute bony abnormality. 2. Diffuse osteopenia. Electronically Signed   By: Margarette Canada M.D.   On: 06/17/2021 13:17   DG Wrist Complete Right  Result Date: 06/17/2021 CLINICAL DATA:  Fall. EXAM: RIGHT WRIST - COMPLETE 3+ VIEW COMPARISON:  None. FINDINGS: No distal radius fracture. No carpal bone fracture. The radiocarpal joint is intact. Abnormal articulation of the radius and ulna with the shortened ulna approximating the radial head. Extensive vascular calcifications. IMPRESSION: 1. No acute fracture of the wrist. 2. 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 Electronically Signed   By: Suzy Bouchard M.D.   On: 06/17/2021 13:24   CT Head Wo Contrast  Result Date: 06/17/2021 CLINICAL DATA:  87 year old female with fall and head injury. Currently on blood thinners. EXAM: CT HEAD WITHOUT CONTRAST TECHNIQUE: Contiguous axial images were obtained from the base of the skull through the vertex without intravenous contrast. COMPARISON:  09/02/2010 MR FINDINGS: Brain: No evidence of acute infarction, hemorrhage, hydrocephalus, extra-axial collection or mass lesion/mass effect. White matter hypodensities and remote basal ganglia lacunar infarcts are noted. Vascular: Carotid atherosclerotic calcifications are noted. Skull: Normal. Negative for fracture or focal lesion. Sinuses/Orbits: No acute abnormality Other: None IMPRESSION: 1. No evidence of acute intracranial abnormality. 2. Chronic small vessel ischemic changes and remote basal ganglia lacunar infarcts. Electronically Signed   By: Margarette Canada M.D.   On: 06/17/2021 16:28   DG Chest Portable 1 View  Result Date: 06/17/2021 CLINICAL DATA:  Fall. EXAM: PORTABLE CHEST 1 VIEW COMPARISON:  Chest x-ray dated March 08, 2018. FINDINGS: The patient is rotated to the right. Stable cardiomediastinal silhouette. New reticulonodular densities in the right mid lung. No focal consolidation, pleural effusion, or pneumothorax. No acute osseous abnormality. IMPRESSION: 1. New reticulonodular densities in the right mid lung, suspicious for bronchiolitis/atypical infection. Electronically Signed   By: Titus Dubin M.D.   On: 06/17/2021 12:47   DG Knee Complete 4 Views Left  Result Date: 06/17/2021 CLINICAL DATA:  Knee pain after fall. EXAM: LEFT KNEE - COMPLETE 4+ VIEW COMPARISON:  May 10, 2005 FINDINGS: No evidence of fracture, dislocation, or joint effusion. Intact 3 component left knee arthroplasty. Vascular calcifications noted.  Mild diffuse soft tissue swelling. IMPRESSION: 1. No acute fracture or dislocation identified about the left knee. 2. Mild diffuse soft tissue swelling. 3. Intact 3  component left knee arthroplasty. Electronically Signed   By: Fidela Salisbury M.D.   On: 06/17/2021 13:16   DG Knee Complete 4 Views Right  Result Date: 06/17/2021 CLINICAL DATA:  Acute RIGHT knee pain following fall. Initial encounter. EXAM: RIGHT KNEE - COMPLETE 4+ VIEW COMPARISON:  04/13/2004 FINDINGS: Total knee arthroplasty changes noted. No acute fracture, dislocation or joint effusion noted. No hardware complicating features are noted. IMPRESSION: No acute abnormality. Electronically Signed   By: Margarette Canada M.D.   On: 06/17/2021 13:16   DG Humerus Right  Result Date: 06/17/2021 CLINICAL DATA:  Fall. EXAM: RIGHT HUMERUS - 2+ VIEW COMPARISON:  None. FINDINGS: Acute comminuted fracture of the mid humeral diaphysis with mild apex anterior angulation. There is a 2.6 cm butterfly fragment. The dominant fracture fragments demonstrate minimal posterior  displacement and mild overriding measuring 8 mm. The right shoulder and elbow are grossly intact. Osteopenia. Soft tissues are unremarkable. IMPRESSION: 1. Acute comminuted fracture of the mid humeral diaphysis. Electronically Signed   By: Titus Dubin M.D.   On: 06/17/2021 12:49    EKG: Independently reviewed.  NSR with rate 82; prolonged QTc 634;  no evidence of acute ischemia   Labs on Admission: I have personally reviewed the available labs and imaging studies at the time of the admission.  Pertinent labs:   CO2 34 BUN 35/Creatinine 1.40/GFR 35 BNP 42.9 HS troponin 6, 7 INR >10 Unremarkable CBC COVID/flu negative   Assessment/Plan Principal Problem:   Fall at home, initial encounter Active Problems:   VTE (venous thromboembolism)   Hypothyroidism (acquired)   Hypertension   Supratherapeutic INR   Class 1 obesity due to excess calories with body mass index (BMI) of 33.0 to 33.9 in adult   Fall -Patient with fall at home -Actually limited injuries given her significantly elevated INR and overall fragility -Other than humerus fracture, no injuries appreciated -Will observe for now on tele  R humerus fracture -Sling in place -Ortho consult pending, likely non-operative -Pain control -PT/OT consults  Supratherapeutic INR -Will reverse with 5 mg IV vitamin K -Not actively bleeding -Negative head CT -Recheck INR per pharmacy (consult requested)  H/o DVT/PE -Needs lifelong AC -Consider transition to [place] therapy for ease in dosing and to avoid this happening again  HTN -prn hydralazine for now - last meds in prior (unmerged) chart are from 2019 and I am unable to reach family  Hypothyroidism -as above, hold Synthroid for now pending med rec/ability to contact family  Obesity -Body mass index is 33.52 kg/m..  -Weight loss should be encouraged -Outpatient PCP/bariatric medicine f/u encouraged   Prolonged QTc  -Will attempt to avoid QT-prolonging medications  such as PPI, nausea meds, SSRIs -Repeat EKG in AM   Abnormal renal function -Uncertain baseline -Will give gentle IVF infusion at 50 cc/hr and recheck in AM    Note: This patient has been tested and is negative for the novel coronavirus COVID-19. The patient has NOT been vaccinated against COVID-19.   Level of care: Telemetry Medical DVT prophylaxis: None - INR >10 Code Status:  Full - confirmed with patient Family Communication: None present; I was unable to reach her son by telephone at the time of admission Disposition Plan:  The patient is from: home  Anticipated d/c is to: home, possibly with [facility] services   Anticipated d/c date will depend on clinical response to treatment, but possibly as early as tomorrow if she has excellent response to treatment  Patient is currently: acutely ill  Consults called: Orthopedics; PT/OT  Admission status:  It is my clinical opinion that referral for OBSERVATION is reasonable and necessary in this patient based on the above information provided. The aforementioned taken together are felt to place the patient at high risk for further clinical deterioration. However it is anticipated that the patient may be medically stable for discharge from the hospital within 24 to 48 hours.    Karmen Bongo MD [facility]   How to contact the [facility] Attending or Consulting provider [place] or covering provider during after hours [place], for this patient?  Check the care team in [facility] and look for a) attending/consulting [facility] provider listed and b) the [facility] team listed Log into [email or web address] and use [place]'s universal password to access. If you do not have the password, please contact the hospital operator. Locate the [facility] provider you are looking for under [facility] and page to a number that you can be directly reached. If you still have difficulty reaching the provider, please page the [facility] (Director on Call) for the Hospitalists listed on amion for  assistance.   06/17/2021, 5:49 PM

## 2021-06-18 DIAGNOSIS — S42351A Displaced comminuted fracture of shaft of humerus, right arm, initial encounter for closed fracture: Secondary | ICD-10-CM | POA: Diagnosis not present

## 2021-06-18 DIAGNOSIS — Z87891 Personal history of nicotine dependence: Secondary | ICD-10-CM | POA: Diagnosis not present

## 2021-06-18 DIAGNOSIS — G2581 Restless legs syndrome: Secondary | ICD-10-CM | POA: Diagnosis present

## 2021-06-18 DIAGNOSIS — I10 Essential (primary) hypertension: Secondary | ICD-10-CM | POA: Diagnosis present

## 2021-06-18 DIAGNOSIS — Z8542 Personal history of malignant neoplasm of other parts of uterus: Secondary | ICD-10-CM | POA: Diagnosis not present

## 2021-06-18 DIAGNOSIS — Z20822 Contact with and (suspected) exposure to covid-19: Secondary | ICD-10-CM | POA: Diagnosis present

## 2021-06-18 DIAGNOSIS — R791 Abnormal coagulation profile: Secondary | ICD-10-CM | POA: Insufficient documentation

## 2021-06-18 DIAGNOSIS — Z86711 Personal history of pulmonary embolism: Secondary | ICD-10-CM | POA: Diagnosis not present

## 2021-06-18 DIAGNOSIS — Y92009 Unspecified place in unspecified non-institutional (private) residence as the place of occurrence of the external cause: Secondary | ICD-10-CM | POA: Diagnosis not present

## 2021-06-18 DIAGNOSIS — R7889 Finding of other specified substances, not normally found in blood: Secondary | ICD-10-CM | POA: Diagnosis present

## 2021-06-18 DIAGNOSIS — Z6833 Body mass index (BMI) 33.0-33.9, adult: Secondary | ICD-10-CM | POA: Diagnosis not present

## 2021-06-18 DIAGNOSIS — W19XXXA Unspecified fall, initial encounter: Secondary | ICD-10-CM | POA: Diagnosis not present

## 2021-06-18 DIAGNOSIS — R9431 Abnormal electrocardiogram [ECG] [EKG]: Secondary | ICD-10-CM | POA: Diagnosis present

## 2021-06-18 DIAGNOSIS — Z9181 History of falling: Secondary | ICD-10-CM | POA: Diagnosis not present

## 2021-06-18 DIAGNOSIS — Z8612 Personal history of poliomyelitis: Secondary | ICD-10-CM | POA: Diagnosis not present

## 2021-06-18 DIAGNOSIS — Z86718 Personal history of other venous thrombosis and embolism: Secondary | ICD-10-CM | POA: Diagnosis not present

## 2021-06-18 DIAGNOSIS — S42354A Nondisplaced comminuted fracture of shaft of humerus, right arm, initial encounter for closed fracture: Secondary | ICD-10-CM | POA: Diagnosis present

## 2021-06-18 DIAGNOSIS — J9811 Atelectasis: Secondary | ICD-10-CM | POA: Diagnosis present

## 2021-06-18 DIAGNOSIS — R531 Weakness: Secondary | ICD-10-CM | POA: Diagnosis present

## 2021-06-18 DIAGNOSIS — Z7901 Long term (current) use of anticoagulants: Secondary | ICD-10-CM | POA: Diagnosis not present

## 2021-06-18 DIAGNOSIS — E669 Obesity, unspecified: Secondary | ICD-10-CM | POA: Diagnosis present

## 2021-06-18 DIAGNOSIS — E039 Hypothyroidism, unspecified: Secondary | ICD-10-CM | POA: Diagnosis present

## 2021-06-18 DIAGNOSIS — J9601 Acute respiratory failure with hypoxia: Secondary | ICD-10-CM | POA: Diagnosis present

## 2021-06-18 DIAGNOSIS — J449 Chronic obstructive pulmonary disease, unspecified: Secondary | ICD-10-CM | POA: Diagnosis present

## 2021-06-18 LAB — CBC
HCT: 36.2 % (ref 36.0–46.0)
Hemoglobin: 10.9 g/dL — ABNORMAL LOW (ref 12.0–15.0)
MCH: 31.3 pg (ref 26.0–34.0)
MCHC: 30.1 g/dL (ref 30.0–36.0)
MCV: 104 fL — ABNORMAL HIGH (ref 80.0–100.0)
Platelets: 245 10*3/uL (ref 150–400)
RBC: 3.48 MIL/uL — ABNORMAL LOW (ref 3.87–5.11)
RDW: 15.6 % — ABNORMAL HIGH (ref 11.5–15.5)
WBC: 9.9 10*3/uL (ref 4.0–10.5)
nRBC: 0 % (ref 0.0–0.2)

## 2021-06-18 LAB — BASIC METABOLIC PANEL
Anion gap: 4 — ABNORMAL LOW (ref 5–15)
BUN: 35 mg/dL — ABNORMAL HIGH (ref 8–23)
CO2: 36 mmol/L — ABNORMAL HIGH (ref 22–32)
Calcium: 8.2 mg/dL — ABNORMAL LOW (ref 8.9–10.3)
Chloride: 99 mmol/L (ref 98–111)
Creatinine, Ser: 1.2 mg/dL — ABNORMAL HIGH (ref 0.44–1.00)
GFR, Estimated: 42 mL/min — ABNORMAL LOW (ref >60)
Glucose, Bld: 87 mg/dL (ref 70–99)
Potassium: 4.8 mmol/L (ref 3.5–5.1)
Sodium: 139 mmol/L (ref 135–145)

## 2021-06-18 LAB — PROTIME-INR
INR: 5.1 (ref 0.8–1.2)
Prothrombin Time: 46.8 seconds — ABNORMAL HIGH (ref 11.4–15.2)

## 2021-06-18 MED ORDER — LATANOPROST 0.005 % OP SOLN
1.0000 [drp] | Freq: Every day | OPHTHALMIC | Status: DC
  Administered 2021-06-18 – 2021-06-20 (×3): 1 [drp] via OPHTHALMIC
  Filled 2021-06-18: qty 2.5

## 2021-06-18 MED ORDER — GABAPENTIN 400 MG PO CAPS
400.0000 mg | ORAL_CAPSULE | Freq: Two times a day (BID) | ORAL | Status: DC
  Administered 2021-06-18 – 2021-06-21 (×7): 400 mg via ORAL
  Filled 2021-06-18 (×7): qty 1

## 2021-06-18 MED ORDER — MIRTAZAPINE 15 MG PO TABS
30.0000 mg | ORAL_TABLET | Freq: Every day | ORAL | Status: DC
  Administered 2021-06-18 – 2021-06-20 (×3): 30 mg via ORAL
  Filled 2021-06-18 (×3): qty 2

## 2021-06-18 MED ORDER — PANTOPRAZOLE SODIUM 40 MG PO TBEC
40.0000 mg | DELAYED_RELEASE_TABLET | Freq: Every day | ORAL | Status: DC
Start: 2021-06-18 — End: 2021-06-22
  Administered 2021-06-18 – 2021-06-21 (×4): 40 mg via ORAL
  Filled 2021-06-18 (×4): qty 1

## 2021-06-18 MED ORDER — VITAMIN D (ERGOCALCIFEROL) 1.25 MG (50000 UNIT) PO CAPS
50000.0000 [IU] | ORAL_CAPSULE | ORAL | Status: DC
  Administered 2021-06-20: 50000 [IU] via ORAL
  Filled 2021-06-18: qty 1

## 2021-06-18 MED ORDER — LEVOTHYROXINE SODIUM 75 MCG PO TABS
75.0000 ug | ORAL_TABLET | Freq: Every day | ORAL | Status: DC
  Administered 2021-06-19 – 2021-06-21 (×3): 75 ug via ORAL
  Filled 2021-06-18 (×3): qty 1

## 2021-06-18 NOTE — Evaluation (Signed)
Occupational Therapy Evaluation Patient Details Name: Crystal [name] MRN: [id number] DOB: 03/09/1925 Today's Date: 06/18/2021    History of Present Illness 87 yo female with onset of fall with trip at home sustained a R acute comminuted fracture of the mid humeral diaphysis, resulting in NWB status and is waiting for a brace, in a sling.  Injury is non operative, will continue to see for mobility with care due to fluctuating high INR.  PMHx:  COPD, polio, hyperthyroidism, remote uterine CA, PE, DVT, falls   Clinical Impression   Pt presents with decline in function and safety with ADLs and ADL mobility with impaired strength, balance, endurance, safety awareness impaired R UE ROM/function (sling, NWB). PTA pt lived at home with her son and she reports that she was Ind with ADLs/selfcare, used RW for mobility, son cooks. Pt currently requires max - total A with LB ADLs, max -  mod A with UB ADLs, min A with grooming/hygiene and max A with standing/transfers. Pt would benefit from acute OT services to address impairments to maximize level of function and safety   Follow Up Recommendations  SNF;Supervision/Assistance - 24 hour    Equipment Recommendations  None recommended by OT    Recommendations for Other Services       Precautions / Restrictions Precautions Precautions: Fall Required Braces or Orthoses: Sling (Humeral Shaft Fracture brace has been ordered from [employer]) Restrictions Weight Bearing Restrictions: Yes RUE Weight Bearing: Non weight bearing Other Position/Activity Restrictions: Humeral Shaft Fracture brace has been ordered from [employer]      Mobility Bed Mobility               General bed mobility comments: pt in recliner upon arrival    Transfers Overall transfer level: Needs assistance   Transfers: Sit to/from Stand;Stand Pivot Transfers Sit to Stand: Max assist Stand pivot transfers: Mod assist       General transfer comment: cues for hand placement,  sequencing, required increased time and effort to power up from recliner and BSC    Balance Overall balance assessment: Needs assistance Sitting-balance support: Feet supported Sitting balance-Leahy Scale: Fair     Standing balance support: During functional activity Standing balance-Leahy Scale: Poor                             ADL either performed or assessed with clinical judgement   ADL Overall ADL's : Needs assistance/impaired Eating/Feeding: Set up;Sitting   Grooming: Wash/dry hands;Wash/dry face;Min guard;Sitting   Upper Body Bathing: Maximal assistance   Lower Body Bathing: Maximal assistance   Upper Body Dressing : Maximal assistance   Lower Body Dressing: Total assistance   Toilet Transfer: Maximal assistance;Moderate assistance;Stand-pivot;BSC;Cueing for safety;Cueing for sequencing   Toileting- Clothing Manipulation and Hygiene: Total assistance       Functional mobility during ADLs: Moderate assistance;Cueing for safety;Cueing for sequencing       Vision         Perception     Praxis      Pertinent Vitals/Pain Pain Assessment: Faces Faces Pain Scale: Hurts little more Pain Location: R UE Pain Descriptors / Indicators: Aching;Discomfort;Sore Pain Intervention(s): Limited activity within patient's tolerance;Monitored during session;Repositioned;Premedicated before session     Hand Dominance Right   Extremity/Trunk Assessment             Communication Communication Communication: No difficulties   Cognition  General Comments       Exercises     Shoulder Instructions      Home Living Family/patient expects to be discharged to:: Private residence Living Arrangements: Spouse/significant other;Children Available Help at Discharge: Family;Available 24 hours/day Type of Home: Apartment Home Access: Stairs to enter Entrance Stairs-Number of Steps: 2+1 Entrance  Stairs-Rails: None Home Layout: One level     Bathroom Shower/Tub: Teacher, early years/pre: Handicapped height     Home Equipment: Environmental consultant - 2 wheels;Cane - single point;Shower seat;Wheelchair - manual;Tub bench   Additional Comments: son availability will factor in dc      Prior Functioning/Environment Level of Independence: Independent with assistive device(s)  Gait / Transfers Assistance Needed: RW short trips, used wheelchair and son there to assist her at times ADL's / Homemaking Assistance Needed: Pt reports that she was Ind with ADLs/selfcare   Comments: reports that she uses a walker        OT Problem List: Decreased strength;Impaired balance (sitting and/or standing);Decreased cognition;Decreased knowledge of precautions;Pain;Decreased safety awareness;Decreased activity tolerance;Decreased coordination;Decreased knowledge of use of DME or AE;Impaired UE functional use      OT Treatment/Interventions: Self-care/ADL training;Therapeutic exercise;Patient/family education;Balance training;Therapeutic activities;DME and/or AE instruction    OT Goals(Current goals can be found in the care plan section) Acute Rehab OT Goals Patient Stated Goal: get better, go home OT Goal Formulation: With patient Time For Goal Achievement: 2021-07-22 Potential to Achieve Goals: Good ADL Goals Pt Will Perform Grooming: with supervision;with set-up;sitting Pt Will Perform Upper Body Bathing: with mod assist;with min assist;sitting Pt Will Perform Upper Body Dressing: with mod assist;with min assist;sitting Pt Will Transfer to Toilet: with min assist;stand pivot transfer;bedside commode Pt Will Perform Toileting - Clothing Manipulation and hygiene: with max assist;with mod assist;sit to/from stand  OT Frequency: Min 2X/week   Barriers to D/C:            Co-evaluation              AM-PAC OT "6 Clicks" Daily Activity     Outcome Measure Help from another person eating  meals?: None Help from another person taking care of personal grooming?: A Little Help from another person toileting, which includes using toliet, bedpan, or urinal?: Total Help from another person bathing (including washing, rinsing, drying)?: A Lot Help from another person to put on and taking off regular upper body clothing?: A Lot Help from another person to put on and taking off regular lower body clothing?: Total 6 Click Score: 13   End of Session Equipment Utilized During Treatment: Gait belt  Activity Tolerance: Patient limited by fatigue Patient left: in chair;with call bell/phone within reach;with chair alarm set  OT Visit Diagnosis: Unsteadiness on feet (R26.81);Other abnormalities of gait and mobility (R26.89);History of falling (Z91.81);Muscle weakness (generalized) (M62.81);Pain Pain - Right/Left: Right Pain - part of body: Arm                Time: 1035-1100 OT Time Calculation (min): 25 min Charges:  OT General Charges $OT Visit: 1 Visit OT Evaluation $OT Eval Moderate Complexity: 1 Mod OT Treatments $Self Care/Home Management : 8-22 mins    Britt Bottom 06/18/2021, 3:55 PM

## 2021-06-18 NOTE — Progress Notes (Signed)
Orthopedic Tech Progress Note Patient Details:  Kimberlea Schlag 1925/06/07 [id number] Humeral Shaft Fracture brace has been ordered from [employer]  Patient ID: Elfredia Nevins, female   DOB: 1925/11/25, 87 y.o.   MRN: [id number]  Jearld Lesch 06/18/2021, 3:11 PM

## 2021-06-18 NOTE — Progress Notes (Signed)
PROGRESS NOTE  Crystal [name] UYQ:[id number] DOB: Jul 22, 1925 DOA: 06/17/2021 PCP: Lajean Manes, MD   LOS: 0 days   Brief Narrative / Interim history: 87 year old female with history of COPD, prior polio, hypertension, hyper thyroidism, remote uterine cancer, DVT/PE on lifelong anticoagulation comes into the hospital after a fall.  She fell while trying to get up out of a chair, no syncope/loss of consciousness.  In the ED she was initially hypoxic, but asymptomatic.  Subjective / 24h Interval events: Doing well, complains of right arm pain  Assessment & Plan: Principal Problem Fall with acute comminuted fracture of the mid humeral diaphysis-orthopedic surgery consulted, patient was evaluated by Dr. Alvan Dame.  For now he recommends nonoperative management, sling as well as S[employer]iento clamshell brace through [employer] -PT to evaluate once INR is better  Active Problems History of DVT/PE-she is on lifelong anticoagulation, INR was significantly supratherapeutic greater than 10.  She received vitamin K, INR dropped but still elevated at 5 -Hold on administering further vitamin K as she is not bleeding -Recheck INR in the morning  History of hypertension-normotensive this morning, monitor  Hypothyroidism -medication reconciliation pending   Obesity -Body mass index is 33.52 kg/m.. -Weight loss should be encouraged   Abnormal renal function -prior creatinine from 2019, normal at that time, uncertain baseline but do suspect CKD 3 a  Abnormal chest x-ray-on admission chest x-ray showed Reticulonodular densities in the right midlung, suspicious for bronchiolitis/atypical infection.  She received ceftriaxone azithromycin in the ED, she is asymptomatic, afebrile, hold further antibiotics  Scheduled Meds:  docusate sodium  100 mg Oral BID   sodium chloride flush  3 mL Intravenous Q12H   Continuous Infusions:  lactated ringers 50 mL/hr at 06/17/21 1803   PRN Meds:.acetaminophen **OR**  acetaminophen, bisacodyl, hydrALAZINE, HYDROcodone-acetaminophen, morphine injection, polyethylene glycol  Diet Orders (From admission, onward)     Start     Ordered   06/17/21 1746  Diet regular Room service appropriate? Yes; Fluid consistency: Thin  Diet effective now       Question Answer Comment  Room service appropriate? Yes   Fluid consistency: Thin      06/17/21 1746            DVT prophylaxis:      Code Status: Full Code  Family Communication: No family at bedside  Status is: Observation  The patient will require care spanning > 2 midnights and should be moved to inpatient because: Inpatient level of care appropriate due to severity of illness  Dispo: The patient is from: Home              Anticipated d/c is to: SNF              Patient currently is not medically stable to d/c.   Difficult to place patient No   Level of care: Telemetry Medical  Consultants:  Orthopedic surgery   Procedures:  None  Microbiology  none  Antimicrobials: none    Objective: Vitals:   06/17/21 1915 06/17/21 1945 06/17/21 2045 06/18/21 0905  BP: (!) 106/44 (!) 94/35 (!) 122/49 (!) 134/46  Pulse: 74 67 76 79  Resp: 19 16 15 20   Temp:   (!) 97.5 F (36.4 C) 99.7 F (37.6 C)  TempSrc:    Oral  SpO2: 98% 97% 96% 90%  Weight:      Height:        Intake/Output Summary (Last 24 hours) at 06/18/2021 1105 Last data filed at 06/18/2021 0600 Gross per 24  hour  Intake 979.89 ml  Output --  Net 979.89 ml   Filed Weights   06/17/21 1215  Weight: 94.2 kg    Examination:  Constitutional: NAD Eyes: no scleral icterus ENMT: Mucous membranes are moist.  Neck: normal, supple Respiratory: clear to auscultation bilaterally, no wheezing, no crackles. Normal respiratory effort. No accessory muscle use.  Cardiovascular: Regular rate and rhythm, no murmurs / rubs / gallops. No LE edema. Abdomen: non distended, no tenderness. Bowel sounds positive.  Musculoskeletal: no  clubbing / cyanosis.  Skin: no rashes Neurologic: non focal    Data Reviewed: I have independently reviewed following labs and imaging studies   CBC: Recent Labs  Lab 06/17/21 1216 06/18/21 0249  WBC 9.1 9.9  NEUTROABS 4.5  --   HGB 12.4 10.9*  HCT 40.9 36.2  MCV 104.3* 104.0*  PLT 274 326   Basic Metabolic Panel: Recent Labs  Lab 06/17/21 1216 06/18/21 0249  NA 138 139  K 4.3 4.8  CL 97* 99  CO2 34* 36*  GLUCOSE 104* 87  BUN 35* 35*  CREATININE 1.40* 1.20*  CALCIUM 8.2* 8.2*   Liver Function Tests: Recent Labs  Lab 06/17/21 1216  AST 18  ALT 11  ALKPHOS 99  BILITOT 0.3  PROT 6.0*  ALBUMIN 2.9*   Coagulation Profile: Recent Labs  Lab 06/17/21 1216 06/17/21 1420 06/18/21 0249  INR >10.0* >10.0* 5.1*   HbA1C: No results for input(s): HGBA1C in the last 72 hours. CBG: No results for input(s): GLUCAP in the last 168 hours.  Recent Results (from the past 240 hour(s))  Resp Panel by RT-PCR (Flu A&B, Covid) Nasopharyngeal Swab     Status: None   Collection Time: 06/17/21 12:19 PM   Specimen: Nasopharyngeal Swab; Nasopharyngeal(NP) swabs in vial transport medium  Result Value Ref Range Status   SARS Coronavirus 2 by RT PCR NEGATIVE NEGATIVE Final    Comment: (NOTE) SARS-CoV-2 target nucleic acids are NOT DETECTED.  The SARS-CoV-2 RNA is generally detectable in upper respiratory specimens during the acute phase of infection. The lowest concentration of SARS-CoV-2 viral copies this assay can detect is 138 copies/mL. A negative result does not preclude SARS-Cov-2 infection and should not be used as the sole basis for treatment or other patient management decisions. A negative result may occur with  improper specimen collection/handling, submission of specimen other than nasopharyngeal swab, presence of viral mutation(s) within the areas targeted by this assay, and inadequate number of viral copies(<138 copies/mL). A negative result must be combined  with clinical observations, patient history, and epidemiological information. The expected result is Negative.  Fact Sheet for Patients:  EntrepreneurPulse.com.au  Fact Sheet for Healthcare Providers:  IncredibleEmployment.be  This test is no t yet approved or cleared by the [place] FDA and  has been authorized for detection and/or diagnosis of SARS-CoV-2 by FDA under an Emergency Use Authorization (EUA). This EUA will remain  in effect (meaning this test can be used) for the duration of the COVID-19 declaration under Section 564(b)(1) of the Act, 21 U.S.C.section 360bbb-3(b)(1), unless the authorization is terminated  or revoked sooner.       Influenza A by PCR NEGATIVE NEGATIVE Final   Influenza B by PCR NEGATIVE NEGATIVE Final    Comment: (NOTE) The Xpert Xpress SARS-CoV-2/FLU/RSV plus assay is intended as an aid in the diagnosis of influenza from Nasopharyngeal swab specimens and should not be used as a sole basis for treatment. Nasal washings and aspirates are unacceptable for Xpert Xpress  SARS-CoV-2/FLU/RSV testing.  Fact Sheet for Patients: EntrepreneurPulse.com.au  Fact Sheet for Healthcare Providers: IncredibleEmployment.be  This test is not yet approved or cleared by the [place] FDA and has been authorized for detection and/or diagnosis of SARS-CoV-2 by FDA under an Emergency Use Authorization (EUA). This EUA will remain in effect (meaning this test can be used) for the duration of the COVID-19 declaration under Section 564(b)(1) of the Act, 21 U.S.C. section 360bbb-3(b)(1), unless the authorization is terminated or revoked.  Performed at [facility], [place] [address], [place], [place] [address]      Radiology Studies: DG Pelvis 1-2 Views  Result Date: 06/17/2021 CLINICAL DATA:  Acute pelvic pain following fall. Initial encounter. EXAM: PELVIS - 1-2 VIEW COMPARISON:   None. FINDINGS: No acute fracture or dislocation identified. No focal bony lesions are present. Surgical changes within the lumbar spine are present. IMPRESSION: No acute abnormality. Electronically Signed   By: Margarette Canada M.D.   On: 06/17/2021 13:18   DG Forearm Right  Result Date: 06/17/2021 CLINICAL DATA:  Acute RIGHT forearm pain following fall. Initial encounter. EXAM: RIGHT FOREARM - 2 VIEW COMPARISON:  None. FINDINGS: No acute fracture, subluxation or dislocation identified. Diffuse osteopenia is noted. Heavy vascular calcifications are present. IMPRESSION: 1. No acute bony abnormality. 2. Diffuse osteopenia. Electronically Signed   By: Margarette Canada M.D.   On: 06/17/2021 13:17   DG Wrist Complete Right  Result Date: 06/17/2021 CLINICAL DATA:  Fall. EXAM: RIGHT WRIST - COMPLETE 3+ VIEW COMPARISON:  None. FINDINGS: No distal radius fracture. No carpal bone fracture. The radiocarpal joint is intact. Abnormal articulation of the radius and ulna with the shortened ulna approximating the radial head. Extensive vascular calcifications. IMPRESSION: 1. No acute fracture of the wrist. 2. 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 Electronically Signed   By: Suzy Bouchard M.D.   On: 06/17/2021 13:24   CT Head Wo Contrast  Result Date: 06/17/2021 CLINICAL DATA:  87 year old female with fall and head injury. Currently on blood thinners. EXAM: CT HEAD WITHOUT CONTRAST TECHNIQUE: Contiguous axial images were obtained from the base of the skull through the vertex without intravenous contrast. COMPARISON:  09/02/2010 MR FINDINGS: Brain: No evidence of acute infarction, hemorrhage, hydrocephalus, extra-axial collection or mass lesion/mass effect. White matter hypodensities and remote basal ganglia lacunar infarcts are noted. Vascular: Carotid atherosclerotic calcifications are noted. Skull: Normal. Negative for fracture or focal lesion. Sinuses/Orbits: No acute abnormality Other: None  IMPRESSION: 1. No evidence of acute intracranial abnormality. 2. Chronic small vessel ischemic changes and remote basal ganglia lacunar infarcts. Electronically Signed   By: Margarette Canada M.D.   On: 06/17/2021 16:28   DG Chest Portable 1 View  Result Date: 06/17/2021 CLINICAL DATA:  Fall. EXAM: PORTABLE CHEST 1 VIEW COMPARISON:  Chest x-ray dated March 08, 2018. FINDINGS: The patient is rotated to the right. Stable cardiomediastinal silhouette. New reticulonodular densities in the right mid lung. No focal consolidation, pleural effusion, or pneumothorax. No acute osseous abnormality. IMPRESSION: 1. New reticulonodular densities in the right mid lung, suspicious for bronchiolitis/atypical infection. Electronically Signed   By: Titus Dubin M.D.   On: 06/17/2021 12:47   DG Knee Complete 4 Views Left  Result Date: 06/17/2021 CLINICAL DATA:  Knee pain after fall. EXAM: LEFT KNEE - COMPLETE 4+ VIEW COMPARISON:  May 10, 2005 FINDINGS: No evidence of fracture, dislocation, or joint effusion. Intact 3 component left knee arthroplasty. Vascular calcifications noted.  Mild diffuse soft tissue swelling. IMPRESSION:  1. No acute fracture or dislocation identified about the left knee. 2. Mild diffuse soft tissue swelling. 3. Intact 3 component left knee arthroplasty. Electronically Signed   By: Fidela Salisbury M.D.   On: 06/17/2021 13:16   DG Knee Complete 4 Views Right  Result Date: 06/17/2021 CLINICAL DATA:  Acute RIGHT knee pain following fall. Initial encounter. EXAM: RIGHT KNEE - COMPLETE 4+ VIEW COMPARISON:  04/13/2004 FINDINGS: Total knee arthroplasty changes noted. No acute fracture, dislocation or joint effusion noted. No hardware complicating features are noted. IMPRESSION: No acute abnormality. Electronically Signed   By: Margarette Canada M.D.   On: 06/17/2021 13:16   DG Humerus Right  Result Date: 06/17/2021 CLINICAL DATA:  Fall. EXAM: RIGHT HUMERUS - 2+ VIEW COMPARISON:  None. FINDINGS: Acute  comminuted fracture of the mid humeral diaphysis with mild apex anterior angulation. There is a 2.6 cm butterfly fragment. The dominant fracture fragments demonstrate minimal posterior displacement and mild overriding measuring 8 mm. The right shoulder and elbow are grossly intact. Osteopenia. Soft tissues are unremarkable. IMPRESSION: 1. Acute comminuted fracture of the mid humeral diaphysis. Electronically Signed   By: Titus Dubin M.D.   On: 06/17/2021 12:49     Marzetta Board, MD, PhD [facility]  Between 7 am - 7 pm I am available, please contact me via Amion (for emergencies) or Securechat (non urgent messages)  Between 7 pm - 7 am I am not available, please contact night coverage MD/APP via Amion

## 2021-06-18 NOTE — Progress Notes (Signed)
PT Cancellation Note  Patient Details Name: Crystal [name] MRN: [id number] DOB: 10/10/1925   Cancelled Treatment:    Reason Eval/Treat Not Completed: Medical issues which prohibited therapy.  Awaiting medical guidance on INR.   Ramond Dial 06/18/2021, 8:39 AM  Mee Hives, PT MS Acute Rehab Dept. Number: [place]

## 2021-06-18 NOTE — Evaluation (Signed)
Physical Therapy Evaluation Patient Details Name: Crystal [name] MRN: [id number] DOB: May 21, 1925 Today's Date: 06/18/2021   History of Present Illness  87 yo female with onset of fall with trip at home sustained a R acute comminuted fracture of the mid humeral diaphysis, resulting in NWB status and is waiting for a brace, in a sling.  Injury is non operative, will continue to see for mobility with care due to fluctuating high INR.  PMHx:  COPD, polio, hyperthyroidism, remote uterine CA, PE, DVT, falls  Clinical Impression  Pt was seen for evaluation of her ability to stand and step to get to the chair, but is not quite safe for a real walking bout.  Her plan is to ask for SNF care and then go home with husband.  Pt is mildly sedentary at baseline, but is able to walk on RW with supervised help.  Her fall history makes her a good rehab stay candidate.    Follow Up Recommendations SNF    Equipment Recommendations  None recommended by PT    Recommendations for Other Services       Precautions / Restrictions Precautions Precautions: Fall Required Braces or Orthoses: Sling (Humeral Shaft Fracture brace has been ordered from [employer]) Restrictions Weight Bearing Restrictions: Yes RUE Weight Bearing: Non weight bearing Other Position/Activity Restrictions: Humeral Shaft Fracture brace has been ordered from [employer]      Mobility  Bed Mobility Overal bed mobility: Needs Assistance Bed Mobility: Supine to Sit     Supine to sit: Mod assist     General bed mobility comments: pt in recliner upon arrival    Transfers Overall transfer level: Needs assistance Equipment used: Rolling walker (2 wheeled);1 person hand held assist Transfers: Sit to/from [facility] Sit to Stand: Max assist Stand pivot transfers: Mod assist       General transfer comment: cues for hand placement, sequencing, required increased time and effort to power up from recliner and  [facility]  Ambulation/Gait             General Gait Details: steps were a part of transfers  Stairs            Wheelchair Mobility    Modified Rankin (Stroke Patients Only)       Balance Overall balance assessment: Needs assistance Sitting-balance support: Feet supported Sitting balance-Leahy Scale: Fair Sitting balance - Comments: fair once set Postural control: Posterior lean;Right lateral lean Standing balance support: During functional activity Standing balance-Leahy Scale: Poor Standing balance comment: Poor due to her RUE in sling and painful flexed posture to control standing                             Pertinent Vitals/Pain Pain Assessment: Faces Faces Pain Scale: Hurts little more Pain Location: R UE Pain Descriptors / Indicators: Aching;Discomfort;Sore Pain Intervention(s): Limited activity within patient's tolerance;Monitored during session;Repositioned;Premedicated before session    Sebastian expects to be discharged to:: Private residence Living Arrangements: Spouse/significant other;Children Available Help at Discharge: Family;Available 24 hours/day Type of Home: Apartment Home Access: Stairs to enter Entrance Stairs-Rails: None Entrance Stairs-Number of Steps: 2+1 Home Layout: One level Home Equipment: Walker - 2 wheels;Cane - single point;Shower seat;Wheelchair - manual;Tub bench Additional Comments: son availability will factor in dc    Prior Function Level of Independence: Independent with assistive device(s)   Gait / Transfers Assistance Needed: RW short trips, used wheelchair and son there to assist her at times  ADL's / Homemaking Assistance Needed: Pt reports that she was Ind with ADLs/selfcare  Comments: reports that she uses a walker     Hand Dominance   Dominant Hand: Right    Extremity/Trunk Assessment   Upper Extremity Assessment Upper Extremity Assessment: Defer to OT evaluation    Lower  Extremity Assessment Lower Extremity Assessment: Generalized weakness    Cervical / Trunk Assessment Cervical / Trunk Assessment: Kyphotic  Communication   Communication: No difficulties  Cognition Arousal/Alertness: Awake/alert Behavior During Therapy: Flat affect Overall Cognitive Status: No family/caregiver present to determine baseline cognitive functioning                                 General Comments: pt is resisting movement initially then more compliant with trasnfers      General Comments General comments (skin integrity, edema, etc.): Pt was seen for supine to sit transition, then to side step to chair and repositioning to make RUE supported    Exercises     Assessment/Plan    PT Assessment Patient needs continued PT services  PT Problem List Decreased strength;Decreased range of motion;Decreased activity tolerance;Decreased balance;Decreased mobility;Decreased coordination;Decreased cognition;Decreased knowledge of use of DME;Decreased safety awareness;Decreased knowledge of precautions;Cardiopulmonary status limiting activity;Pain;Decreased skin integrity       PT Treatment Interventions DME instruction;Gait training;Stair training;Functional mobility training;Therapeutic activities;Therapeutic exercise;Balance training;Neuromuscular re-education;Patient/family education    PT Goals (Current goals can be found in the Care Plan section)  Acute Rehab PT Goals Patient Stated Goal: get better, go home PT Goal Formulation: With patient Time For Goal Achievement: 27-Jul-2021 Potential to Achieve Goals: Good    Frequency Min 3X/week   Barriers to discharge Decreased caregiver support;Inaccessible home environment stairs to enter and having pain on RUE    Co-evaluation               AM-PAC PT "6 Clicks" Mobility  Outcome Measure Help needed turning from your back to your side while in a flat bed without using bedrails?: A Lot Help needed moving  from lying on your back to sitting on the side of a flat bed without using bedrails?: A Lot Help needed moving to and from a bed to a chair (including a wheelchair)?: A Lot Help needed standing up from a chair using your arms (e.g., wheelchair or bedside chair)?: A Lot Help needed to walk in hospital room?: A Lot Help needed climbing 3-5 steps with a railing? : Total 6 Click Score: 11    End of Session Equipment Utilized During Treatment: Gait belt Activity Tolerance: Patient tolerated treatment well;Patient limited by fatigue Patient left: in chair;with call bell/phone within reach;with chair alarm set Nurse Communication: Mobility status PT Visit Diagnosis: Unsteadiness on feet (R26.81);Muscle weakness (generalized) (M62.81);History of falling (Z91.81);Pain;Difficulty in walking, not elsewhere classified (R26.2) Pain - Right/Left: Right Pain - part of body: Arm;Shoulder    Time: 0174-9449 PT Time Calculation (min) (ACUTE ONLY): 29 min   Charges:   PT Evaluation $PT Eval Moderate Complexity: 1 Mod PT Treatments $Therapeutic Activity: 8-22 mins       Ramond Dial 06/18/2021, 4:25 PM  Mee Hives, PT MS Acute Rehab Dept. Number: [place]

## 2021-06-18 NOTE — Plan of Care (Signed)

## 2021-06-18 NOTE — Progress Notes (Signed)
Patient ID: Crystal [name], female   DOB: 1925-07-03, 87 y.o.   MRN: [id number]  Sarmiento brace ordered ([employer])  Continue NWB through her RUE  I will see her in the office in 2 weeks for X-rays

## 2021-06-18 NOTE — Progress Notes (Signed)
Nurse just paged attending about patient abnormal LAB of PT and INR ( see lab result) patient slept well with no complaints, will continue to monitor

## 2021-06-19 DIAGNOSIS — R9389 Abnormal findings on diagnostic imaging of other specified body structures: Secondary | ICD-10-CM

## 2021-06-19 DIAGNOSIS — S42351A Displaced comminuted fracture of shaft of humerus, right arm, initial encounter for closed fracture: Secondary | ICD-10-CM | POA: Diagnosis not present

## 2021-06-19 LAB — CBC
HCT: 34.4 % — ABNORMAL LOW (ref 36.0–46.0)
Hemoglobin: 10.7 g/dL — ABNORMAL LOW (ref 12.0–15.0)
MCH: 31.5 pg (ref 26.0–34.0)
MCHC: 31.1 g/dL (ref 30.0–36.0)
MCV: 101.2 fL — ABNORMAL HIGH (ref 80.0–100.0)
Platelets: 257 10*3/uL (ref 150–400)
RBC: 3.4 MIL/uL — ABNORMAL LOW (ref 3.87–5.11)
RDW: 15.4 % (ref 11.5–15.5)
WBC: 11.1 10*3/uL — ABNORMAL HIGH (ref 4.0–10.5)
nRBC: 0 % (ref 0.0–0.2)

## 2021-06-19 LAB — COMPREHENSIVE METABOLIC PANEL
ALT: 9 U/L (ref 0–44)
AST: 18 U/L (ref 15–41)
Albumin: 2.5 g/dL — ABNORMAL LOW (ref 3.5–5.0)
Alkaline Phosphatase: 90 U/L (ref 38–126)
Anion gap: 3 — ABNORMAL LOW (ref 5–15)
BUN: 33 mg/dL — ABNORMAL HIGH (ref 8–23)
CO2: 35 mmol/L — ABNORMAL HIGH (ref 22–32)
Calcium: 8.2 mg/dL — ABNORMAL LOW (ref 8.9–10.3)
Chloride: 101 mmol/L (ref 98–111)
Creatinine, Ser: 1.13 mg/dL — ABNORMAL HIGH (ref 0.44–1.00)
GFR, Estimated: 45 mL/min — ABNORMAL LOW (ref >60)
Glucose, Bld: 100 mg/dL — ABNORMAL HIGH (ref 70–99)
Potassium: 4.5 mmol/L (ref 3.5–5.1)
Sodium: 139 mmol/L (ref 135–145)
Total Bilirubin: 0.9 mg/dL (ref 0.3–1.2)
Total Protein: 5.3 g/dL — ABNORMAL LOW (ref 6.5–8.1)

## 2021-06-19 LAB — PROTIME-INR
INR: 1.9 — ABNORMAL HIGH (ref 0.8–1.2)
Prothrombin Time: 21.8 seconds — ABNORMAL HIGH (ref 11.4–15.2)

## 2021-06-19 MED ORDER — WARFARIN SODIUM 3 MG PO TABS
3.0000 mg | ORAL_TABLET | Freq: Once | ORAL | Status: AC
  Administered 2021-06-19: 3 mg via ORAL
  Filled 2021-06-19: qty 1

## 2021-06-19 MED ORDER — WARFARIN - PHARMACIST DOSING INPATIENT: Freq: Every day | Status: DC

## 2021-06-19 NOTE — Hospital Course (Signed)
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  She is cared for at home by her son and has not left the house much recently including for her INR checks.  On admission, INR was greater than 10 with no evidence of bleeding.  She underwent administration of vitamin K and Coumadin was held. In regards to her fall, she underwent imaging on admission and was found to have an acute comminuted fracture of the mid humeral diaphysis on her right arm.  She was evaluated by orthopedic surgery and recommended for a clamshell brace.

## 2021-06-19 NOTE — Assessment & Plan Note (Signed)
Continue Synthroid °

## 2021-06-19 NOTE — Progress Notes (Signed)
Progress Note    Crystal [name]   QBH:[id number]  DOB: 05-16-1925  DOA: 06/17/2021     1  PCP: Lajean Manes, MD  CC: fall at home  Hospital Course: 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  She is cared for at home by her son and has not left the house much recently including for her INR checks.  On admission, INR was greater than 10 with no evidence of bleeding.  She underwent administration of vitamin K and Coumadin was held. In regards to her fall, she underwent imaging on admission and was found to have an acute comminuted fracture of the mid humeral diaphysis on her right arm.  She was evaluated by orthopedic surgery and recommended for a clamshell brace.  Interval History:  Patient poor historian but was mostly comfortable resting in bed.  Bruising noted on right arm.  She says she did not tolerate the brace yesterday due to not feeling comfortable. Called and discussed case with her son who wishes to bring her home at time of discharge.  We also discussed anticoagulation and he wishes to continue Coumadin but is asking for home health in order to have consistent INR checks.  ROS: Review of systems not obtained due to patient factors.  Cognitive impairment  Assessment & Plan: * Comminuted right humeral fracture Acute comminuted fracture of the mid humeral diaphysis - nonoperative management, sling as well as Sarmiento clamshell brace through [employer] - continue pain control - likely not much rehab potential and son wishes to bring patient home at time of discharge and is declining rehab placement   Supratherapeutic INR - INR greater than 10 on admission.  No overt bleeding.  Reversed with vitamin K -INR down to 1.9 this morning - Patient has not had routine INR testing outpatient due to her decreased mobility in general -  Home health will be requested for continuing INR checks at discharge  History of DVT (deep vein thrombosis) - Discussed other anticoagulation options with her son on the phone.  Given ongoing fall risk, he wishes to continue on Coumadin but is requesting home health for INR checks as he is unable to take her to the appointments as easily due to her severe immobility -INR has been reversed with vitamin K on admission - INR down to 1.9 this morning - Resume Coumadin.  Likely needs adjustment of dose prior to discharge, will discuss with pharmacy at that time  Abnormal chest x-ray on admission chest x-ray showed Reticulonodular densities in the right midlung, suspicious for bronchiolitis/atypical infection.  She received ceftriaxone azithromycin in the ED, she is asymptomatic, afebrile, hold further antibiotics  Class 1 obesity due to excess calories with body mass index (BMI) of 33.0 to 33.9 in adult -Body mass index is 33.52 kg/m.  Hypothyroidism - Continue Synthroid    Old records reviewed in assessment of this patient  Antimicrobials:   DVT prophylaxis:   Coumadin   Code Status:   Code Status: Full Code Family Communication: Son  Disposition Plan: Status is: Inpatient  Remains inpatient appropriate because:Ongoing active pain requiring inpatient pain management and Inpatient level of care appropriate due to severity of illness  Dispo: The patient is from: Home              Anticipated d/c is to: Home  Patient currently is not medically stable to d/c.   Difficult to place patient No  Risk of unplanned readmission score: Unplanned Admission- Pilot do not use: 17.9   Objective: Blood pressure 119/69, pulse 92, temperature 98.2 F (36.8 C), temperature source Oral, resp. rate 17, height 5\' 6"  (1.676 m), weight 94.2 kg, SpO2 98 %.  Examination: General appearance: alert, cooperative, no distress, and slowed mentation Head: Normocephalic, without obvious  abnormality, atraumatic Eyes:  EOMI Lungs: clear to auscultation bilaterally Heart: regular rate and rhythm and S1, S2 normal Abdomen: normal findings: bowel sounds normal and soft, non-tender Extremities:  Right arm noted with bruising and malrotated right hand Skin: mobility and turgor normal Neurologic: Follows commands, moves all 4 extremities.  Dense right upper extremity weakness noted  Consultants:    Procedures:    Data Reviewed: I have personally reviewed following labs and imaging studies Results for orders placed or performed during the hospital encounter of 06/17/21 (from the past 24 hour(s))  Comprehensive metabolic panel     Status: Abnormal   Collection Time: 06/19/21  2:09 AM  Result Value Ref Range   Sodium 139 135 - 145 mmol/L   Potassium 4.5 3.5 - 5.1 mmol/L   Chloride 101 98 - 111 mmol/L   CO2 35 (H) 22 - 32 mmol/L   Glucose, Bld 100 (H) 70 - 99 mg/dL   BUN 33 (H) 8 - 23 mg/dL   Creatinine, Ser 1.13 (H) 0.44 - 1.00 mg/dL   Calcium 8.2 (L) 8.9 - 10.3 mg/dL   Total Protein 5.3 (L) 6.5 - 8.1 g/dL   Albumin 2.5 (L) 3.5 - 5.0 g/dL   AST 18 15 - 41 U/L   ALT 9 0 - 44 U/L   Alkaline Phosphatase 90 38 - 126 U/L   Total Bilirubin 0.9 0.3 - 1.2 mg/dL   GFR, Estimated 45 (L) >60 mL/min   Anion gap 3 (L) 5 - 15  CBC     Status: Abnormal   Collection Time: 06/19/21  2:09 AM  Result Value Ref Range   WBC 11.1 (H) 4.0 - 10.5 K/uL   RBC 3.40 (L) 3.87 - 5.11 MIL/uL   Hemoglobin 10.7 (L) 12.0 - 15.0 g/dL   HCT 34.4 (L) 36.0 - 46.0 %   MCV 101.2 (H) 80.0 - 100.0 fL   MCH 31.5 26.0 - 34.0 pg   MCHC 31.1 30.0 - 36.0 g/dL   RDW 15.4 11.5 - 15.5 %   Platelets 257 150 - 400 K/uL   nRBC 0.0 0.0 - 0.2 %  Protime-INR     Status: Abnormal   Collection Time: 06/19/21  8:27 AM  Result Value Ref Range   Prothrombin Time 21.8 (H) 11.4 - 15.2 seconds   INR 1.9 (H) 0.8 - 1.2    Recent Results (from the past 240 hour(s))  Resp Panel by RT-PCR (Flu A&B, Covid) Nasopharyngeal  Swab     Status: None   Collection Time: 06/17/21 12:19 PM   Specimen: Nasopharyngeal Swab; Nasopharyngeal(NP) swabs in vial transport medium  Result Value Ref Range Status   SARS Coronavirus 2 by RT PCR NEGATIVE NEGATIVE Final    Comment: (NOTE) SARS-CoV-2 target nucleic acids are NOT DETECTED.  The SARS-CoV-2 RNA is generally detectable in upper respiratory specimens during the acute phase of infection. The lowest concentration of SARS-CoV-2 viral copies this assay can detect is 138 copies/mL. A negative result does not preclude SARS-Cov-2 infection and should not be used as the sole basis  for treatment or other patient management decisions. A negative result may occur with  improper specimen collection/handling, submission of specimen other than nasopharyngeal swab, presence of viral mutation(s) within the areas targeted by this assay, and inadequate number of viral copies(<138 copies/mL). A negative result must be combined with clinical observations, patient history, and epidemiological information. The expected result is Negative.  Fact Sheet for Patients:  EntrepreneurPulse.com.au  Fact Sheet for Healthcare Providers:  IncredibleEmployment.be  This test is no t yet approved or cleared by the [place] FDA and  has been authorized for detection and/or diagnosis of SARS-CoV-2 by FDA under an Emergency Use Authorization (EUA). This EUA will remain  in effect (meaning this test can be used) for the duration of the COVID-19 declaration under Section 564(b)(1) of the Act, 21 U.S.C.section 360bbb-3(b)(1), unless the authorization is terminated  or revoked sooner.       Influenza A by PCR NEGATIVE NEGATIVE Final   Influenza B by PCR NEGATIVE NEGATIVE Final    Comment: (NOTE) The Xpert Xpress SARS-CoV-2/FLU/RSV plus assay is intended as an aid in the diagnosis of influenza from Nasopharyngeal swab specimens and should not be used as a sole  basis for treatment. Nasal washings and aspirates are unacceptable for Xpert Xpress SARS-CoV-2/FLU/RSV testing.  Fact Sheet for Patients: EntrepreneurPulse.com.au  Fact Sheet for Healthcare Providers: IncredibleEmployment.be  This test is not yet approved or cleared by the [place] FDA and has been authorized for detection and/or diagnosis of SARS-CoV-2 by FDA under an Emergency Use Authorization (EUA). This EUA will remain in effect (meaning this test can be used) for the duration of the COVID-19 declaration under Section 564(b)(1) of the Act, 21 U.S.C. section 360bbb-3(b)(1), unless the authorization is terminated or revoked.  Performed at [facility], [place] [address] [place], [place] [address]      Radiology Studies: No results found. CT Head Wo Contrast  Final Result    DG Knee Complete 4 Views Right  Final Result    DG Forearm Right  Final Result    DG Knee Complete 4 Views Left  Final Result    DG Pelvis 1-2 Views  Final Result    DG Wrist Complete Right  Final Result    DG Chest Portable 1 View  Final Result    DG Humerus Right  Final Result      Scheduled Meds:  docusate sodium  100 mg Oral BID   gabapentin  400 mg Oral BID   latanoprost  1 drop Both Eyes QHS   levothyroxine  75 mcg Oral QAC breakfast   mirtazapine  30 mg Oral QHS   pantoprazole  40 mg Oral Daily   sodium chloride flush  3 mL Intravenous Q12H   [START ON 06/20/2021] Vitamin D (Ergocalciferol)  50,000 Units Oral Q14 Days   Warfarin - Pharmacist Dosing Inpatient   Does not apply q1600   PRN Meds: acetaminophen **OR** acetaminophen, bisacodyl, hydrALAZINE, HYDROcodone-acetaminophen, morphine injection, polyethylene glycol Continuous Infusions:   LOS: 1 day  Time spent: Greater than 50% of the 35 minute visit was spent in counseling/coordination of care for the patient as laid out in the A&P.   Dwyane Dee, MD [facility] 06/19/2021, 5:29 PM

## 2021-06-19 NOTE — Progress Notes (Signed)
Physical Therapy Treatment Patient Details Name: Crystal [name] MRN: [id number] DOB: 06/07/1925 Today's Date: 06/19/2021    History of Present Illness Pt is 87 yo female admitted on 06/17/21 with fall  at home sustained a R acute comminuted fracture of the mid humeral diaphysis, resulting in NWB status in Sarmiento brace, non-operative treatment.  Pt with highly fluctuating INR values. Of note - Humerus fx in on arm that was affected by polio as a child, so was largely non-functional at baseline. PMHx:  COPD, polio, hyperthyroidism, remote uterine CA, PE, DVT, falls    PT Comments    Pt had increased confusion that somewhat limited today, but she was still able to participate with multiple sit to stands and transfers.  Did return to bed due to confusion and unsafe to leave in chair.  Continue to recommend SNF , but noted family declining SNF.  Pt does have w/c at home if returns home.  PLOF is questionable - at eval pt reports that she ambulated, noted in case manager note that pt largely w/c bound and can't stand, and today pt stood with therapy. Family not present to confirm PLOF.  At this time, left goals as they were at eval based on PLOF obtained at eval.     Follow Up Recommendations  SNF (but family refusing -if home 24 hr support and HHPT)     Equipment Recommendations  3in1 (PT)    Recommendations for Other Services       Precautions / Restrictions Precautions Precautions: Fall Required Braces or Orthoses: Other Brace Other Brace: Sarmiento Brace from [employer] Restrictions RUE Weight Bearing: Non weight bearing Other Position/Activity Restrictions: Pt keeps removing humeral shaft fracture brace.    Mobility  Bed Mobility Overal bed mobility: Needs Assistance Bed Mobility: Supine to Sit;Sit to Supine     Supine to sit: Mod assist Sit to supine: Mod assist   General bed mobility comments: Increased time and cues for sequencing and scooting to EOB     Transfers Overall transfer level: Needs assistance Equipment used: 1 person hand held assist Transfers: Sit to/from Stand Sit to Stand: Mod assist Stand pivot transfers: Mod assist       General transfer comment: Sit to stand x 3 during session with pivots toward [facility] with mod A.  Pt utilizing back of recliner with L UE to stabilize.  Returned to bed due to confusion  Ambulation/Gait                 Marine scientist Rankin (Stroke Patients Only)       Balance Overall balance assessment: Needs assistance Sitting-balance support: Feet supported Sitting balance-Leahy Scale: Good     Standing balance support: During functional activity;Single extremity supported Standing balance-Leahy Scale: Poor Standing balance comment: Requiring L UE support and min A                            Cognition Arousal/Alertness: Awake/alert Behavior During Therapy: WFL for tasks assessed/performed Overall Cognitive Status: No family/caregiver present to determine baseline cognitive functioning                                 General Comments: Pt with some hx of confusion.  However, she now reports seeing people do crazy things in her room like  disappearing into a wall      Exercises      General Comments General comments (skin integrity, edema, etc.): Spoke with RN and pt keeps removing [employer].  Pt agreed to brace during therapy.  Placed tube sleeve to protect skin then stabilized arm and placed brace as able.  Difficult fit due to hx of polio in arm, fragile skin, and skin folds.      Pertinent Vitals/Pain Pain Assessment: Faces Faces Pain Scale: Hurts even more Pain Location: R UE with movement/when placeing brace Pain Descriptors / Indicators: Sharp Pain Intervention(s): Limited activity within patient's tolerance;Monitored during session;Repositioned    Home Living                       Prior Function            PT Goals (current goals can now be found in the care plan section) Acute Rehab PT Goals Patient Stated Goal: get better, go home PT Goal Formulation: With patient Time For Goal Achievement: 2021/07/04 Potential to Achieve Goals: Good Progress towards PT goals: Progressing toward goals    Frequency    Min 3X/week      PT Plan Current plan remains appropriate    Co-evaluation              AM-PAC PT "6 Clicks" Mobility   Outcome Measure  Help needed turning from your back to your side while in a flat bed without using bedrails?: A Lot Help needed moving from lying on your back to sitting on the side of a flat bed without using bedrails?: A Lot Help needed moving to and from a bed to a chair (including a wheelchair)?: A Lot Help needed standing up from a chair using your arms (e.g., wheelchair or bedside chair)?: A Lot Help needed to walk in hospital room?: A Lot Help needed climbing 3-5 steps with a railing? : Total 6 Click Score: 11    End of Session Equipment Utilized During Treatment: Gait belt Activity Tolerance: Patient tolerated treatment well Patient left: in bed;with call bell/phone within reach;with bed alarm set Nurse Communication: Mobility status PT Visit Diagnosis: Unsteadiness on feet (R26.81);Muscle weakness (generalized) (M62.81);History of falling (Z91.81);Pain;Difficulty in walking, not elsewhere classified (R26.2) Pain - Right/Left: Right Pain - part of body: Arm;Shoulder     Time: 1635-1700 PT Time Calculation (min) (ACUTE ONLY): 25 min  Charges:  $Therapeutic Activity: 23-37 mins                     Abran Richard, PT Acute Rehab Services Pager [phone] [facility] 06/19/2021, 5:43 PM

## 2021-06-19 NOTE — Progress Notes (Signed)
ANTICOAGULATION CONSULT NOTE - Initial Consult  Pharmacy Consult for Coumadin Indication:  h/o DVT/PE  Not on File  Patient Measurements: Height: 5\' 6"  (167.6 cm) Weight: 94.2 kg (207 lb 10.8 oz) IBW/kg (Calculated) : 59.3   Vital Signs: Temp: 98.2 F (36.8 C) (07/19 0740) Temp Source: Oral (07/19 0740) BP: 136/60 (07/19 0740) Pulse Rate: 80 (07/19 0740)  Labs: Recent Labs    06/17/21 1216 06/17/21 1420 06/18/21 0249 06/19/21 0209 06/19/21 0827  HGB 12.4  --  10.9* 10.7*  --   HCT 40.9  --  36.2 34.4*  --   PLT 274  --  245 257  --   LABPROT 85.3* 83.1* 46.8*  --  21.8*  INR >10.0* >10.0* 5.1*  --  1.9*  CREATININE 1.40*  --  1.20* 1.13*  --   TROPONINIHS 6 7  --   --   --     Estimated Creatinine Clearance: 36 mL/min (A) (by C-G formula based on SCr of 1.13 mg/dL (H)).   Medical History: Past Medical History:  Diagnosis Date   Class 1 obesity due to excess calories with body mass index (BMI) of 33.0 to 33.9 in adult 06/17/2021   COPD (chronic obstructive pulmonary disease) (HCC)    Hypertension    Hypothyroidism (acquired)    Polio    residual RUE weakness   Restless leg syndrome    Uterine cancer (HCC)    remote   VTE (venous thromboembolism)    lifelong AC    Assessment: CC/HPI: Fall - on warfarin - rt arm deformity  PMH:  COPD, prior polio, hypertension, hyper thyroidism, remote uterine cancer, DVT/PE on lifelong anticoagulation  Anticoag: Patient on warfarin PTA for DVT/PE (2mg /3mg  alternating) - INR 10 > 5.1 post vit k 5 mg (given 7/17 @ 1836) - CT neg for ICH, did show small vessel ischemic changes  - 7/19: INR down to 1.9. Hgb 10.7. Plts WNL.  Goal of Therapy:  INR 2-3 Monitor platelets by anticoagulation protocol: Yes   Plan:  Coumadin 3mg  po x 1 tonight Daily INR   Antrell Tipler S. Alford Highland, PharmD, BCPS Clinical Staff Pharmacist [email or web address] Alford Highland, [place] 06/19/2021,11:35 AM

## 2021-06-19 NOTE — TOC CAGE-AID Note (Signed)
Transition of Care [facility]) - CAGE-AID Screening   Patient Details  Name: Ryane Konieczny MRN: [id number] Date of Birth: March 27, 1925  Transition of Care [facility]) CM/SW Contact:    Bosten Newstrom C Tarpley-Carter, [place] Phone Number: 06/19/2021, 10:25 AM   Clinical Narrative: Pt is unable to participate in Cage Aid. Pt did not meet criteria/inappropriate.  Tyleigh Mahn Tarpley-Carter, MSW, LCSW-A Pronouns:  She/Her/Hers [facility] Clinical Social Worker Direct Number:  [phone] [email or web address]   CAGE-AID Screening: Substance Abuse Screening unable to be completed due to: : Patient unable to participate ((Pt does not meet criteria/inappropriate.))

## 2021-06-19 NOTE — Assessment & Plan Note (Signed)
-  Body mass index is 33.52 kg/m.

## 2021-06-19 NOTE — Assessment & Plan Note (Addendum)
-   Acute comminuted fracture of the mid humeral diaphysis - nonoperative management, sling as well as Sarmiento clamshell brace through [facility] - likely not much rehab potential and son wishes to bring patient home at time of discharge and is declining rehab placement

## 2021-06-19 NOTE — Assessment & Plan Note (Addendum)
on admission chest x-ray showed Reticulonodular densities in the right midlung, suspicious for bronchiolitis/atypical infection.  She received ceftriaxone azithromycin in the ED, she is asymptomatic, afebrile, hold further antibiotics -Repeat CXR in 4 to 6 weeks

## 2021-06-19 NOTE — Plan of Care (Signed)
@   0755 Patient still refusing splint and has removed sling. Does not want either piece of equipment. I have educated patient, but patient still refuses.   Problem: Education: Goal: Knowledge of General Education information will improve Description: Including pain rating scale, medication(s)/side effects and non-pharmacologic comfort measures Outcome: Progressing   Problem: Activity: Goal: Risk for activity intolerance will decrease Outcome: Progressing   Problem: Pain Managment: Goal: General experience of comfort will improve Outcome: Progressing   Problem: Safety: Goal: Ability to remain free from injury will improve Outcome: Progressing   Problem: Skin Integrity: Goal: Risk for impaired skin integrity will decrease Outcome: Progressing

## 2021-06-19 NOTE — TOC Progression Note (Signed)
Transition of Care [facility]) - Progression Note    Patient Details  Name: Crystal [name] MRN: [id number] Date of Birth: 1925/07/06  Transition [facility]) CM/SW Contact  Milinda Antis, [place] Phone Number: 06/19/2021, 10:04 AM  Clinical Narrative:    10:00-  CSW observed that the PT and OT were recommending SNF for the patient.  Due the patient having hallucinations on this day, CSW attempted to contact the patient's son to begin initiating the search for SNF.  CSW called the number listed in the system and received an automated message stating that the number was disconnected.  CSW contacted RN and requested that RN contact CSW when the patient's family is on the floor so that CSW can make contact.          Expected Discharge Plan and Services                                                 Social Determinants of Health (SDOH) Interventions    Readmission Risk Interventions No flowsheet data found.

## 2021-06-19 NOTE — TOC Initial Note (Addendum)
Transition of Care [facility]) - Initial/Assessment Note    Patient Details  Name: Crystal [name] MRN: [id number] Date of Birth: February 06, 1925  [facility]) CM/SW Contact:    Sharin Mons, RN Phone Number: 06/19/2021, 11:35 AM  Clinical Narrative:            - s/p fall, suffered R  humeral fracture, Hx of COPD; RLS; polio; HTN; hypothyroidism; remote uterine CA; and DVT/PE  From home with son ,Jenny Reichmann ( primary caregiver). Pt oriented to self only. NCM spoke with son Jenny Reichmann regarding d/c planning. John stated he has care for pt x 3 yrs and plans for pt to d/c to home when ready.  John states pt is primarily w/c bound, can't stand.   Agreeable to home health services. DECLINED SNF placement.  John without preference for home health agency, "whatever insurance will accept". Referral made with [facility] for home health services pending MD'S orders...[facility] Kitchenacceptance pending.  Pt already has DME: W/C, reclining chair and BSC.  PT/OT evaluations pending....  Pt will need PTAR services for transportation to home        [facility] team following and will assist with TOC needs....   06/20/2021  10:33 am [facility] Means unable to provide [facility] services. Referral made with [facility], acceptance pending.  06/20/2021 [address] unable to provide [facility] services 2/2 to staffing. Referral made with [facility], acceptance pending.  06/20/2021@ [place] unable to accept 2/2 staffing.  06/20/2021 @ [place] unable to accept 2/2 payor/ staffing.  06/20/2021 @ [place] reviewing for home health services, acceptance pending.  06/20/2021 @ [place] accepted pt for home health services.  Expected Discharge Plan: [place] Barriers to Discharge: Continued Medical Work up   Patient Goals and CMS Choice        Expected Discharge Plan and Services Expected Discharge Plan: [place]   Discharge  Planning Services: CM Consult   Living arrangements for the past 2 months: Single Family Home                        Prior Living Arrangements/Services Living arrangements for the past 2 months: Single Family Home Lives with:: Adult Children Patient language and need for interpreter reviewed:: Yes        Need for Family Participation in Patient Care: Yes (Comment) Care giver support system in place?: Yes (comment)   Criminal Activity/Legal Involvement Pertinent to Current Situation/Hospitalization: No - Comment as needed  Activities of Daily Living Home Assistive Devices/Equipment: Other (Comment) ADL Screening (condition at time of admission) Patient's cognitive ability adequate to safely complete daily activities?: No Is the patient deaf or have difficulty hearing?: No Does the patient have difficulty seeing, even when wearing glasses/contacts?: No Does the patient have difficulty concentrating, remembering, or making decisions?: Yes Patient able to express need for assistance with ADLs?: Yes Does the patient have difficulty dressing or bathing?: Yes Independently performs ADLs?: Yes (appropriate for developmental age) Does the patient have difficulty walking or climbing stairs?: Yes Weakness of Legs: None Weakness of Arms/Hands: Right  Permission Sought/Granted   Permission granted to share information with : Yes, Verbal Permission Granted  Share Information with NAME: Bartolo Darter (Son) [phone]           Emotional Assessment Appearance:: Appears stated age     Orientation: : Oriented to Self Alcohol / Substance Use: Not Applicable  Psych Involvement: No (comment)  Admission diagnosis:  Acute respiratory failure with hypoxia (HCC) [J96.01] Supratherapeutic INR [R79.1] Closed nondisplaced comminuted fracture of shaft of right humerus, initial encounter [S42.354A] Fall at home, initial encounter [W19.XXXA, K81.388] Elevated INR [R79.1] Patient Active Problem  List   Diagnosis Date Noted   Elevated INR 06/18/2021   VTE (venous thromboembolism) 06/17/2021   Polio 06/17/2021   Hypothyroidism (acquired) 06/17/2021   Hypertension 06/17/2021   COPD (chronic obstructive pulmonary disease) ([place]) 06/17/2021   Restless leg syndrome 06/17/2021   Fall at home, initial encounter 06/17/2021   Supratherapeutic INR 06/17/2021   Class 1 obesity due to excess calories with body mass index (BMI) of 33.0 to 33.9 in adult 06/17/2021   Prolonged QT interval 06/17/2021   PCP:  Lajean Manes, MD Pharmacy:  No Pharmacies Listed    Social Determinants of Health (SDOH) Interventions    Readmission Risk Interventions No flowsheet data found.

## 2021-06-19 NOTE — Assessment & Plan Note (Addendum)
-   INR greater than 10 on admission.  No overt bleeding.  Reversed with vitamin K -Son has difficulty getting her to outpatient INR appointments - changed to Eliquis at discharge

## 2021-06-19 NOTE — Assessment & Plan Note (Addendum)
-   Discussed other anticoagulation options with her son on the phone.  Given ongoing fall risk, he wished to continue on Coumadin but is requesting home health for INR checks as he is unable to take her to the appointments as easily due to her severe immobility; after further conversation with son, he was ultimately comfortable with trying Eliquis again at discharge. Script was sent to her pharmacy and Coumadin was discontinued; she would no longer need HHRN for INR checks

## 2021-06-20 DIAGNOSIS — S42351A Displaced comminuted fracture of shaft of humerus, right arm, initial encounter for closed fracture: Secondary | ICD-10-CM | POA: Diagnosis not present

## 2021-06-20 DIAGNOSIS — J9601 Acute respiratory failure with hypoxia: Secondary | ICD-10-CM

## 2021-06-20 LAB — CBC WITH DIFFERENTIAL/PLATELET
Abs Immature Granulocytes: 0.05 10*3/uL (ref 0.00–0.07)
Basophils Absolute: 0 10*3/uL (ref 0.0–0.1)
Basophils Relative: 0 %
Eosinophils Absolute: 0.1 10*3/uL (ref 0.0–0.5)
Eosinophils Relative: 1 %
HCT: 37.1 % (ref 36.0–46.0)
Hemoglobin: 11.6 g/dL — ABNORMAL LOW (ref 12.0–15.0)
Immature Granulocytes: 0 %
Lymphocytes Relative: 37 %
Lymphs Abs: 4.7 10*3/uL — ABNORMAL HIGH (ref 0.7–4.0)
MCH: 31.3 pg (ref 26.0–34.0)
MCHC: 31.3 g/dL (ref 30.0–36.0)
MCV: 100 fL (ref 80.0–100.0)
Monocytes Absolute: 0.9 10*3/uL (ref 0.1–1.0)
Monocytes Relative: 7 %
Neutro Abs: 7 10*3/uL (ref 1.7–7.7)
Neutrophils Relative %: 55 %
Platelets: 251 10*3/uL (ref 150–400)
RBC: 3.71 MIL/uL — ABNORMAL LOW (ref 3.87–5.11)
RDW: 15.6 % — ABNORMAL HIGH (ref 11.5–15.5)
WBC: 12.8 10*3/uL — ABNORMAL HIGH (ref 4.0–10.5)
nRBC: 0 % (ref 0.0–0.2)

## 2021-06-20 LAB — BASIC METABOLIC PANEL
Anion gap: 6 (ref 5–15)
BUN: 27 mg/dL — ABNORMAL HIGH (ref 8–23)
CO2: 32 mmol/L (ref 22–32)
Calcium: 8.4 mg/dL — ABNORMAL LOW (ref 8.9–10.3)
Chloride: 101 mmol/L (ref 98–111)
Creatinine, Ser: 0.96 mg/dL (ref 0.44–1.00)
GFR, Estimated: 55 mL/min — ABNORMAL LOW (ref >60)
Glucose, Bld: 105 mg/dL — ABNORMAL HIGH (ref 70–99)
Potassium: 4.6 mmol/L (ref 3.5–5.1)
Sodium: 139 mmol/L (ref 135–145)

## 2021-06-20 LAB — PROTIME-INR
INR: 1.6 — ABNORMAL HIGH (ref 0.8–1.2)
Prothrombin Time: 19.3 seconds — ABNORMAL HIGH (ref 11.4–15.2)

## 2021-06-20 LAB — MAGNESIUM: Magnesium: 2.2 mg/dL (ref 1.7–2.4)

## 2021-06-20 MED ORDER — WARFARIN SODIUM 3 MG PO TABS
3.0000 mg | ORAL_TABLET | Freq: Once | ORAL | Status: AC
  Administered 2021-06-20: 3 mg via ORAL
  Filled 2021-06-20: qty 1

## 2021-06-20 MED ORDER — OXYCODONE HCL 5 MG PO TABS
5.0000 mg | ORAL_TABLET | Freq: Four times a day (QID) | ORAL | 0 refills | Status: AC | PRN
Start: 2021-06-20 — End: 2022-06-20

## 2021-06-20 MED ORDER — ACETAMINOPHEN 325 MG PO TABS: 650.0000 mg | ORAL_TABLET | ORAL | Status: AC | PRN

## 2021-06-20 NOTE — Discharge Summary (Addendum)
Physician Discharge Summary   Crystal [name] WCH:[id number] DOB: [birth year] DOA: 06/17/2021  PCP: Crystal Manes, MD  Admit date: 06/17/2021 Discharge date:  06/21/2021  Admitted From: home Disposition:  Home with [facility] Discharging physician: Dwyane Dee, MD  Recommendations for Outpatient Follow-up:  Repeat CXR in 4-6 weeks  Home Health: PT Equipment/Devices:   Patient discharged to home in Discharge Condition: stable Risk of unplanned readmission score: Unplanned Admission- Pilot do not use: 15.41  CODE STATUS: Full Diet recommendation:  Diet Orders (From admission, onward)     Start     Ordered   06/21/21 0000  Diet general        06/21/21 1126   06/20/21 0000  Diet general        06/20/21 1519   06/17/21 1746  Diet regular Room service appropriate? Yes; Fluid consistency: Thin  Diet effective now       Question Answer Comment  Room service appropriate? 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  She is cared for at home by her son and has not left the house much recently including for her INR checks.  On admission, INR was greater than 10 with no evidence of bleeding.  She underwent administration of vitamin K and Coumadin was held. In regards to her fall, she underwent imaging on admission and was found to have an acute comminuted fracture of the mid humeral diaphysis on her right arm.  She was evaluated by orthopedic surgery and recommended for a clamshell brace.  * Comminuted right humeral fracture - Acute comminuted fracture of the mid humeral diaphysis - nonoperative management, sling as well as Sarmiento clamshell brace through [facility] - likely not much rehab potential and son wishes to bring patient home at time of discharge and is  declining rehab placement  Acute respiratory failure with hypoxia ([place]) - likely due to compressive atelectasis - need to encourage spirometry use some - continue O2, will need at discharge   History of DVT (deep vein thrombosis) - Discussed other anticoagulation options with her son on the phone.  Given ongoing fall risk, he wished to continue on Coumadin but is requesting home health for INR checks as he is unable to take her to the appointments as easily due to her severe immobility; after further conversation with son, he was ultimately comfortable with trying Eliquis again at discharge. Script was sent to her pharmacy and Coumadin was discontinued; she would no longer need HHRN for INR checks  Supratherapeutic INR-resolved as of 06/20/2021 - INR greater than 10 on admission.  No overt bleeding.  Reversed with vitamin K -Son has difficulty getting her to outpatient INR appointments - changed to Eliquis at discharge   Abnormal chest x-ray on admission chest x-ray showed Reticulonodular densities in the right midlung, suspicious for bronchiolitis/atypical infection.  She received ceftriaxone azithromycin in the ED, she is asymptomatic, afebrile, hold further antibiotics -Repeat CXR in 4 to 6 weeks  Class 1 obesity due to excess calories with body mass index (BMI) of 33.0 to 33.9 in adult -Body mass index is 33.52 kg/m.  Hypothyroidism - Continue Synthroid   The patient's chronic medical conditions were treated accordingly per the patient's home medication regimen except as noted.  On day of discharge, patient was felt deemed stable for discharge. Patient/family member advised to call PCP or come back to ER if needed.   Principal Diagnosis: Comminuted right humeral fracture  Discharge Diagnoses: Active Hospital Problems   Diagnosis Date Noted   Comminuted right humeral fracture 06/19/2021    Priority: High   Acute respiratory failure with hypoxia ([place]) 06/20/2021    Priority:  High   History of DVT (deep vein thrombosis) 06/17/2021    Priority: Medium   Abnormal chest x-ray 06/19/2021    Priority: Low   Hypothyroidism 06/17/2021   Hypertension 06/17/2021   Class 1 obesity due to excess calories with body mass index (BMI) of 33.0 to 33.9 in adult 06/17/2021   Prolonged QT interval 06/17/2021    Resolved Hospital Problems   Diagnosis Date Noted Date Resolved   Supratherapeutic INR 06/17/2021 06/20/2021    Priority: Medium    Discharge Instructions     Diet general   Complete by: As directed    Diet general   Complete by: As directed    Increase activity slowly   Complete by: As directed    Increase activity slowly   Complete by: As directed       Allergies as of 06/21/2021   Not on File      Medication List     STOP taking these medications    warfarin 2 MG tablet Commonly known as: COUMADIN   warfarin 3 MG tablet Commonly known as: COUMADIN       TAKE these medications    acetaminophen 325 MG tablet Commonly known as: TYLENOL Take 2 tablets (650 mg total) by mouth every 4 (four) hours as needed for mild pain (or Fever >/= 101).   benazepril 10 MG tablet Commonly known as: LOTENSIN Take 10 mg by mouth daily.   Eliquis 5 MG Tabs tablet Generic drug: apixaban Take 1 tablet (5 mg total) by mouth 2 (two) times daily.   furosemide 20 MG tablet Commonly known as: LASIX Take 40 mg by mouth daily.   gabapentin 400 MG capsule Commonly known as: NEURONTIN Take 400 mg by mouth in the morning, at noon, and at bedtime.   latanoprost 0.005 % ophthalmic solution Commonly known as: XALATAN Place 1 drop into both eyes at bedtime.   levothyroxine 75 MCG tablet Commonly known as: SYNTHROID Take 75 mcg by mouth daily.   LORazepam 0.5 MG tablet Commonly known as: ATIVAN Take 0.5 mg by mouth at bedtime as needed for sleep.   mirtazapine 30 MG tablet Commonly known as: REMERON Take 30 mg by mouth at bedtime.   omeprazole 20 MG  capsule Commonly known as: PRILOSEC Take 20 mg by mouth daily.   oxyCODONE 5 MG immediate release tablet Commonly known as: Roxicodone Take 1 tablet (5 mg total) by mouth every 6 (six) hours as needed.   pramipexole 0.75 MG tablet Commonly known as: MIRAPEX Take 0.75 mg by mouth at bedtime.   Vitamin D (Ergocalciferol) 1.25 MG (50000 UNIT) Caps capsule Commonly known as: DRISDOL Take 50,000 Units by mouth every 14 (fourteen) days.   zolpidem 10 MG tablet Commonly known as: AMBIEN Take 5 mg by mouth at bedtime.               Durable Medical Equipment  (From admission, onward)           Start     Ordered   06/20/21 1654  For home use only DME oxygen  Once  Question Answer Comment  Length of Need 6 Months   Mode or (Route) Nasal cannula   Liters per Minute 1   Frequency Continuous (stationary and portable oxygen unit needed)   Oxygen conserving device Yes   Oxygen delivery system Gas      06/20/21 1654            Follow-up Information     Paralee Cancel, MD. Schedule an appointment as soon as possible for a visit in 2 week(s).   Specialty: Orthopedic Surgery Why: For X-rays Contact information: [address] [place] [address] [phone]         [facility] [place] Follow up.   Specialty: Home Health Services Why: home health services (RN,PT) will be provided by [facility], start of care 06/22/2021 Contact information: [place] [address] [phone]         Dr. Lajean [name]. Go on 07/11/2021.   Why: Post hospital followup scheduled for 07/11/2021 at 10am with PCP Contact information: Address: [address], [place], [place] [address] Phone: [phone]               Not on File  Consultations:   Discharge Exam: BP (!) 133/49 (BP Location: Left Arm)   Pulse 82   Temp (!) 97.5 F (36.4 C) (Oral)   Resp 16   Ht 5\' 6"  (1.676 m)   Wt 94.2 kg   SpO2 90%    BMI 33.52 kg/m  General appearance: alert, cooperative, no distress, and slowed mentation Head: Normocephalic, without obvious abnormality, atraumatic Eyes:  EOMI Lungs: clear to auscultation bilaterally Heart: regular rate and rhythm and S1, S2 normal Abdomen: normal findings: bowel sounds normal and soft, non-tender Extremities:  Right arm noted with bruising and malrotated right hand. Brace in place  Skin: mobility and turgor normal Neurologic: Follows commands, moves all 4 extremities.  Dense right upper extremity weakness noted  The results of significant diagnostics from this hospitalization (including imaging, microbiology, ancillary and laboratory) are listed below for reference.   Microbiology: Recent Results (from the past 240 hour(s))  Resp Panel by RT-PCR (Flu A&B, Covid) Nasopharyngeal Swab     Status: None   Collection Time: 06/17/21 12:19 PM   Specimen: Nasopharyngeal Swab; Nasopharyngeal(NP) swabs in vial transport medium  Result Value Ref Range Status   SARS Coronavirus 2 by RT PCR NEGATIVE NEGATIVE Final    Comment: (NOTE) SARS-CoV-2 target nucleic acids are NOT DETECTED.  The SARS-CoV-2 RNA is generally detectable in upper respiratory specimens during the acute phase of infection. The lowest concentration of SARS-CoV-2 viral copies this assay can detect is 138 copies/mL. A negative result does not preclude SARS-Cov-2 infection and should not be used as the sole basis for treatment or other patient management decisions. A negative result may occur with  improper specimen collection/handling, submission of specimen other than nasopharyngeal swab, presence of viral mutation(s) within the areas targeted by this assay, and inadequate number of viral copies(<138 copies/mL). A negative result must be combined with clinical observations, patient history, and epidemiological information. The expected result is Negative.  Fact Sheet for Patients:   EntrepreneurPulse.com.au  Fact Sheet for Healthcare Providers:  IncredibleEmployment.be  This test is no t yet approved or cleared by the [place] FDA and  has been authorized for detection and/or diagnosis of SARS-CoV-2 by FDA under an Emergency Use Authorization (EUA). This EUA will remain  in effect (meaning this test can be used)  for the duration of the COVID-19 declaration under Section 564(b)(1) of the Act, 21 U.S.C.section 360bbb-3(b)(1), unless the authorization is terminated  or revoked sooner.       Influenza A by PCR NEGATIVE NEGATIVE Final   Influenza B by PCR NEGATIVE NEGATIVE Final    Comment: (NOTE) The Xpert Xpress SARS-CoV-2/FLU/RSV plus assay is intended as an aid in the diagnosis of influenza from Nasopharyngeal swab specimens and should not be used as a sole basis for treatment. Nasal washings and aspirates are unacceptable for Xpert Xpress SARS-CoV-2/FLU/RSV testing.  Fact Sheet for Patients: EntrepreneurPulse.com.au  Fact Sheet for Healthcare Providers: IncredibleEmployment.be  This test is not yet approved or cleared by the [place] FDA and has been authorized for detection and/or diagnosis of SARS-CoV-2 by FDA under an Emergency Use Authorization (EUA). This EUA will remain in effect (meaning this test can be used) for the duration of the COVID-19 declaration under Section 564(b)(1) of the Act, 21 U.S.C. section 360bbb-3(b)(1), unless the authorization is terminated or revoked.  Performed at [facility], [place] [address] [place], [place] [address]      Labs: BNP (last 3 results) Recent Labs    06/17/21 1217  BNP 76.7   Basic Metabolic Panel: Recent Labs  Lab 06/17/21 1216 06/18/21 0249 06/19/21 0209 06/20/21 0153 06/21/21 0353  NA 138 139 139 139 139  K 4.3 4.8 4.5 4.6 4.2  CL 97* 99 101 101 98  CO2 34* 36* 35* 32 34*  GLUCOSE 104* 87 100* 105*  91  BUN 35* 35* 33* 27* 23  CREATININE 1.40* 1.20* 1.13* 0.96 1.00  CALCIUM 8.2* 8.2* 8.2* 8.4* 8.5*  MG  --   --   --  2.2 2.2   Liver Function Tests: Recent Labs  Lab 06/17/21 1216 06/19/21 0209  AST 18 18  ALT 11 9  ALKPHOS 99 90  BILITOT 0.3 0.9  PROT 6.0* 5.3*  ALBUMIN 2.9* 2.5*   No results for input(s): LIPASE, AMYLASE in the last 168 hours. No results for input(s): AMMONIA in the last 168 hours. CBC: Recent Labs  Lab 06/17/21 1216 06/18/21 0249 06/19/21 0209 06/20/21 0153 06/21/21 0353  WBC 9.1 9.9 11.1* 12.8* 11.7*  NEUTROABS 4.5  --   --  7.0 6.2  HGB 12.4 10.9* 10.7* 11.6* 11.4*  HCT 40.9 36.2 34.4* 37.1 35.9*  MCV 104.3* 104.0* 101.2* 100.0 99.2  PLT 274 245 257 251 285   Cardiac Enzymes: No results for input(s): CKTOTAL, CKMB, CKMBINDEX, TROPONINI in the last 168 hours. BNP: Invalid input(s): POCBNP CBG: No results for input(s): GLUCAP in the last 168 hours. D-Dimer No results for input(s): DDIMER in the last 72 hours. Hgb A1c No results for input(s): HGBA1C in the last 72 hours. Lipid Profile No results for input(s): CHOL, HDL, LDLCALC, TRIG, CHOLHDL, LDLDIRECT in the last 72 hours. Thyroid function studies No results for input(s): TSH, T4TOTAL, T3FREE, THYROIDAB in the last 72 hours.  Invalid input(s): FREET3 Anemia work up No results for input(s): VITAMINB12, FOLATE, FERRITIN, TIBC, IRON, RETICCTPCT in the last 72 hours. Urinalysis No results found for: COLORURINE, APPEARANCEUR, [place], [place], [place], [place], [place], [place], PROTEINUR, UROBILINOGEN, NITRITE, LEUKOCYTESUR Sepsis Labs Invalid input(s): PROCALCITONIN,  WBC,  LACTICIDVEN Microbiology Recent Results (from the past 240 hour(s))  Resp Panel by RT-PCR (Flu A&B, Covid) Nasopharyngeal Swab     Status: None   Collection Time: 06/17/21 12:19 PM   Specimen: Nasopharyngeal Swab; Nasopharyngeal(NP) swabs in vial transport medium  Result Value  Ref Range Status   SARS  Coronavirus 2 by RT PCR NEGATIVE NEGATIVE Final    Comment: (NOTE) SARS-CoV-2 target nucleic acids are NOT DETECTED.  The SARS-CoV-2 RNA is generally detectable in upper respiratory specimens during the acute phase of infection. The lowest concentration of SARS-CoV-2 viral copies this assay can detect is 138 copies/mL. A negative result does not preclude SARS-Cov-2 infection and should not be used as the sole basis for treatment or other patient management decisions. A negative result may occur with  improper specimen collection/handling, submission of specimen other than nasopharyngeal swab, presence of viral mutation(s) within the areas targeted by this assay, and inadequate number of viral copies(<138 copies/mL). A negative result must be combined with clinical observations, patient history, and epidemiological information. The expected result is Negative.  Fact Sheet for Patients:  EntrepreneurPulse.com.au  Fact Sheet for Healthcare Providers:  IncredibleEmployment.be  This test is no t yet approved or cleared by the [place] FDA and  has been authorized for detection and/or diagnosis of SARS-CoV-2 by FDA under an Emergency Use Authorization (EUA). This EUA will remain  in effect (meaning this test can be used) for the duration of the COVID-19 declaration under Section 564(b)(1) of the Act, 21 U.S.C.section 360bbb-3(b)(1), unless the authorization is terminated  or revoked sooner.       Influenza A by PCR NEGATIVE NEGATIVE Final   Influenza B by PCR NEGATIVE NEGATIVE Final    Comment: (NOTE) The Xpert Xpress SARS-CoV-2/FLU/RSV plus assay is intended as an aid in the diagnosis of influenza from Nasopharyngeal swab specimens and should not be used as a sole basis for treatment. Nasal washings and aspirates are unacceptable for Xpert Xpress SARS-CoV-2/FLU/RSV testing.  Fact Sheet for  Patients: EntrepreneurPulse.com.au  Fact Sheet for Healthcare Providers: IncredibleEmployment.be  This test is not yet approved or cleared by the [place] FDA and has been authorized for detection and/or diagnosis of SARS-CoV-2 by FDA under an Emergency Use Authorization (EUA). This EUA will remain in effect (meaning this test can be used) for the duration of the COVID-19 declaration under Section 564(b)(1) of the Act, 21 U.S.C. section 360bbb-3(b)(1), unless the authorization is terminated or revoked.  Performed at [facility], [place] [address] [place], [place] [address]     Procedures/Studies: DG Pelvis 1-2 Views  Result Date: 06/17/2021 CLINICAL DATA:  Acute pelvic pain following fall. Initial encounter. EXAM: PELVIS - 1-2 VIEW COMPARISON:  None. FINDINGS: No acute fracture or dislocation identified. No focal bony lesions are present. Surgical changes within the lumbar spine are present. IMPRESSION: No acute abnormality. Electronically Signed   By: Margarette Canada M.D.   On: 06/17/2021 13:18   DG Forearm Right  Result Date: 06/17/2021 CLINICAL DATA:  Acute RIGHT forearm pain following fall. Initial encounter. EXAM: RIGHT FOREARM - 2 VIEW COMPARISON:  None. FINDINGS: No acute fracture, subluxation or dislocation identified. Diffuse osteopenia is noted. Heavy vascular calcifications are present. IMPRESSION: 1. No acute bony abnormality. 2. Diffuse osteopenia. Electronically Signed   By: Margarette Canada M.D.   On: 06/17/2021 13:17   DG Wrist Complete Right  Result Date: 06/17/2021 CLINICAL DATA:  Fall. EXAM: RIGHT WRIST - COMPLETE 3+ VIEW COMPARISON:  None. FINDINGS: No distal radius fracture. No carpal bone fracture. The radiocarpal joint is intact. Abnormal articulation of the radius and ulna with the shortened ulna approximating the radial head. Extensive vascular calcifications. IMPRESSION: 1. No acute fracture of the wrist. 2. 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 Electronically Signed   By: Suzy Bouchard M.D.   On: 06/17/2021 13:24   CT Head Wo Contrast  Result Date: 06/17/2021 CLINICAL DATA:  87 year old female with fall and head injury. Currently on blood thinners. EXAM: CT HEAD WITHOUT CONTRAST TECHNIQUE: Contiguous axial images were obtained from the base of the skull through the vertex without intravenous contrast. COMPARISON:  09/02/2010 MR FINDINGS: Brain: No evidence of acute infarction, hemorrhage, hydrocephalus, extra-axial collection or mass lesion/mass effect. White matter hypodensities and remote basal ganglia lacunar infarcts are noted. Vascular: Carotid atherosclerotic calcifications are noted. Skull: Normal. Negative for fracture or focal lesion. Sinuses/Orbits: No acute abnormality Other: None IMPRESSION: 1. No evidence of acute intracranial abnormality. 2. Chronic small vessel ischemic changes and remote basal ganglia lacunar infarcts. Electronically Signed   By: Margarette Canada M.D.   On: 06/17/2021 16:28   DG Chest Portable 1 View  Result Date: 06/17/2021 CLINICAL DATA:  Fall. EXAM: PORTABLE CHEST 1 VIEW COMPARISON:  Chest x-ray dated March 08, 2018. FINDINGS: The patient is rotated to the right. Stable cardiomediastinal silhouette. New reticulonodular densities in the right mid lung. No focal consolidation, pleural effusion, or pneumothorax. No acute osseous abnormality. IMPRESSION: 1. New reticulonodular densities in the right mid lung, suspicious for bronchiolitis/atypical infection. Electronically Signed   By: Titus Dubin M.D.   On: 06/17/2021 12:47   DG Knee Complete 4 Views Left  Result Date: 06/17/2021 CLINICAL DATA:  Knee pain after fall. EXAM: LEFT KNEE - COMPLETE 4+ VIEW COMPARISON:  May 10, 2005 FINDINGS: No evidence of fracture, dislocation, or joint effusion. Intact 3 component left knee arthroplasty. Vascular calcifications noted.  Mild diffuse soft tissue swelling.  IMPRESSION: 1. No acute fracture or dislocation identified about the left knee. 2. Mild diffuse soft tissue swelling. 3. Intact 3 component left knee arthroplasty. Electronically Signed   By: Fidela Salisbury M.D.   On: 06/17/2021 13:16   DG Knee Complete 4 Views Right  Result Date: 06/17/2021 CLINICAL DATA:  Acute RIGHT knee pain following fall. Initial encounter. EXAM: RIGHT KNEE - COMPLETE 4+ VIEW COMPARISON:  04/13/2004 FINDINGS: Total knee arthroplasty changes noted. No acute fracture, dislocation or joint effusion noted. No hardware complicating features are noted. IMPRESSION: No acute abnormality. Electronically Signed   By: Margarette Canada M.D.   On: 06/17/2021 13:16   DG Humerus Right  Result Date: 06/17/2021 CLINICAL DATA:  Fall. EXAM: RIGHT HUMERUS - 2+ VIEW COMPARISON:  None. FINDINGS: Acute comminuted fracture of the mid humeral diaphysis with mild apex anterior angulation. There is a 2.6 cm butterfly fragment. The dominant fracture fragments demonstrate minimal posterior displacement and mild overriding measuring 8 mm. The right shoulder and elbow are grossly intact. Osteopenia. Soft tissues are unremarkable. IMPRESSION: 1. Acute comminuted fracture of the mid humeral diaphysis. Electronically Signed   By: Titus Dubin M.D.   On: 06/17/2021 12:49     Time coordinating discharge: Over 30 minutes    Dwyane Dee, MD  [facility] 06/21/2021, 11:30 AM

## 2021-06-20 NOTE — Discharge Instructions (Addendum)
Have your INR checked weekly starting 06/20/21.  Frequency can be further adjusted pending stability of INR

## 2021-06-20 NOTE — Progress Notes (Signed)
Occupational Therapy Treatment Patient Details Name: Crystal [name] MRN: [id number] DOB: 25-Jun-1925 Today's Date: 06/20/2021    History of present illness Pt is 87 yo female admitted on 06/17/21 with fall  at home sustained a R acute comminuted fracture of the mid humeral diaphysis, resulting in NWB status in Sarmiento brace, non-operative treatment.  Pt with highly fluctuating INR values. Of note - Humerus fx in on arm that was affected by polio as a child, so was largely non-functional at baseline. PMHx:  COPD, polio, hyperthyroidism, remote uterine CA, PE, DVT, falls   OT comments  Crystal [name] is incrementally progressing with plans to d/c home today with son. Pt was witting in chair upon arrival, she was mod A +2 to sit<>stand and stand pivot from bed>chair as well as bed mobility. Pt oriented to place, but said it was "January" when cued she was able to correct to July. She also communicated that her son and her sister were in the room, and they were not. Pt required max A to don UB clothing while sitting EOB. Pt continued to benefit from acute OT. Pt's son declined SNF placement, pt with plans for [facility] transport home today.    Follow Up Recommendations  Home health OT (Family declined SNF placement, pt going home with [facility]. She will need max HHOT.)    Equipment Recommendations  None recommended by OT       Precautions / Restrictions Precautions Precautions: Fall Required Braces or Orthoses: Other Brace Other Brace: Sarmiento Brace from [employer] Restrictions Weight Bearing Restrictions: Yes RUE Weight Bearing: Non weight bearing       Mobility Bed Mobility Overal bed mobility: Needs Assistance Bed Mobility: Sit to Supine     Supine to sit: Mod assist;+2 for physical assistance     General bed mobility comments: Increased time and cues for sequencing and scooting to EOB    Transfers Overall transfer level: Needs assistance Equipment used: 2 person hand held  assist Transfers: Sit to/from [facility] Sit to Stand: Mod assist;+2 physical assistance Stand pivot transfers: +2 physical assistance;Mod assist            Balance Overall balance assessment: Needs assistance Sitting-balance support: Feet supported Sitting balance-Leahy Scale: Fair     Standing balance support: Single extremity supported Standing balance-Leahy Scale: Poor             ADL either performed or assessed with clinical judgement   ADL Overall ADL's : Needs assistance/impaired                 Upper Body Dressing : Maximal assistance;Sitting Upper Body Dressing Details (indicate cue type and reason): max A to thread clothing over RUE, over head and orient shirt. Lower Body Dressing: Total assistance               Functional mobility during ADLs: Moderate assistance;+2 for physical assistance General ADL Comments: Assisted pt in getting dress to prepare for d/c      Cognition Arousal/Alertness: Awake/alert Behavior During Therapy: WFL for tasks assessed/performed Overall Cognitive Status: History of cognitive impairments - at baseline               General Comments: Pt oriented to location, not month or time. She reported seeing her son in the "box" while pointing to the bed, and said "my sister is right there in the other bed" while pointing to the bathroom.              General Comments  no new concerns noted    Pertinent Vitals/ Pain       Pain Assessment: Faces Faces Pain Scale: Hurts even more Pain Location: R UE with movement/when placeing brace Pain Descriptors / Indicators: Sharp Pain Intervention(s): Monitored during session   Frequency  Min 2X/week        Progress Toward Goals  OT Goals(current goals can now be found in the care plan section)  Progress towards OT goals: Progressing toward goals  Acute Rehab OT Goals Patient Stated Goal: get better, go home OT Goal Formulation: With  patient Time For Goal Achievement: 07-06-2021 Potential to Achieve Goals: Good ADL Goals Pt Will Perform Grooming: with supervision;with set-up;sitting Pt Will Perform Upper Body Bathing: with mod assist;with min assist;sitting Pt Will Perform Upper Body Dressing: with mod assist;with min assist;sitting Pt Will Transfer to Toilet: with min assist;stand pivot transfer;bedside commode Pt Will Perform Toileting - Clothing Manipulation and hygiene: with max assist;with mod assist;sit to/from stand  Plan Discharge plan remains appropriate       AM-PAC OT "6 Clicks" Daily Activity     Outcome Measure   Help from another person eating meals?: None Help from another person taking care of personal grooming?: A Little Help from another person toileting, which includes using toliet, bedpan, or urinal?: Total Help from another person bathing (including washing, rinsing, drying)?: A Lot Help from another person to put on and taking off regular upper body clothing?: A Lot Help from another person to put on and taking off regular lower body clothing?: Total 6 Click Score: 13    End of Session Equipment Utilized During Treatment: Gait belt (RUE brace)  OT Visit Diagnosis: Unsteadiness on feet (R26.81);Other abnormalities of gait and mobility (R26.89);History of falling (Z91.81);Muscle weakness (generalized) (M62.81);Pain   Activity Tolerance Patient limited by pain   Patient Left in bed   Nurse Communication Mobility status        Time: 6808-8110 OT Time Calculation (min): 22 min  Charges: OT General Charges $OT Visit: 1 Visit OT Treatments $Self Care/Home Management : 8-22 mins   Crystal [name] 06/20/2021, 4:04 PM

## 2021-06-20 NOTE — Progress Notes (Signed)
Physical Therapy Treatment Patient Details Name: Crystal [name] MRN: [id number] DOB: December 14, 1924 Today's Date: 06/20/2021    History of Present Illness Pt is 87 yo female admitted on 06/17/21 with fall  at home sustained a R acute comminuted fracture of the mid humeral diaphysis, resulting in NWB status in Sarmiento brace, non-operative treatment.  Pt with highly fluctuating INR values. Of note - Humerus fx in on arm that was affected by polio as a child, so was largely non-functional at baseline. PMHx:  COPD, polio, hyperthyroidism, remote uterine CA, PE, DVT, falls    PT Comments    Pt supine in bed on arrival.  Pt performing standing trials in standing frame and presents with DOE.  Pt presents with WOB and continues to require 1L [place] to maintain sats.  Informed nursing and case manager of desat with activity.  Pt continues to decline snf and requesting to return home.  Will recommend HHPT if they continue to decline snf.    Follow Up Recommendations  SNF (family refusing snf will required 24 hour support at home and HHPT.)     Equipment Recommendations  3in1 (PT)    Recommendations for Other Services       Precautions / Restrictions Precautions Precautions: Fall Required Braces or Orthoses: Other Brace Other Brace: Sarmiento Brace from [employer] Restrictions Weight Bearing Restrictions: Yes RUE Weight Bearing: Non weight bearing Other Position/Activity Restrictions: Pt keeps removing humeral shaft fracture brace.    Mobility  Bed Mobility Overal bed mobility: Needs Assistance Bed Mobility: Supine to Sit;Sit to Supine     Supine to sit: Mod assist;+2 for safety/equipment Sit to supine: Mod assist;+2 for physical assistance   General bed mobility comments: Pt required assistance for trunk elevation and scooting of hips to move to edge of bed.  Pt required assistance to lower trunk and lift B LEs back to bed.    Transfers Overall transfer level: Needs assistance Equipment  used: Ambulation equipment used (sara stedy with L UE.) Transfers: Sit to/from Stand Sit to Stand: Mod assist;+2 physical assistance Stand pivot transfers: +2 physical assistance;Mod assist       General transfer comment: SIT to STAND x3 in sara stedy with encouragement to increase standing trial to 10 sec.  Cues and facilitation to achieve hip extension and trunk extension.  Ambulation/Gait                 Stairs             Wheelchair Mobility    Modified Rankin (Stroke Patients Only)       Balance Overall balance assessment: Needs assistance Sitting-balance support: Feet supported Sitting balance-Leahy Scale: Fair Sitting balance - Comments: fair once set Postural control: Posterior lean;Right lateral lean Standing balance support: Single extremity supported Standing balance-Leahy Scale: Poor Standing balance comment: Requiring L UE support and min A                            Cognition Arousal/Alertness: Awake/alert Behavior During Therapy: WFL for tasks assessed/performed Overall Cognitive Status: History of cognitive impairments - at baseline                                 General Comments: Pt continues to report seeing things in room that are not present      Exercises      General Comments General comments (skin integrity, edema,  etc.): no new concerns noted      Pertinent Vitals/Pain Pain Assessment: Faces Faces Pain Scale: Hurts even more Pain Location: R UE with movement/when placeing brace Pain Descriptors / Indicators: Sharp Pain Intervention(s): Monitored during session;Repositioned    Home Living                      Prior Function            PT Goals (current goals can now be found in the care plan section) Acute Rehab PT Goals Patient Stated Goal: get better, go home Potential to Achieve Goals: Good Progress towards PT goals: Progressing toward goals    Frequency    Min  3X/week      PT Plan Current plan remains appropriate    Co-evaluation              AM-PAC PT "6 Clicks" Mobility   Outcome Measure  Help needed turning from your back to your side while in a flat bed without using bedrails?: A Lot Help needed moving from lying on your back to sitting on the side of a flat bed without using bedrails?: A Lot Help needed moving to and from a bed to a chair (including a wheelchair)?: A Lot Help needed standing up from a chair using your arms (e.g., wheelchair or bedside chair)?: A Lot Help needed to walk in hospital room?: Total Help needed climbing 3-5 steps with a railing? : Total 6 Click Score: 10    End of Session Equipment Utilized During Treatment: Gait belt Activity Tolerance: Patient limited by fatigue;Patient limited by pain Patient left: in bed;with call bell/phone within reach;with bed alarm set Nurse Communication: Mobility status PT Visit Diagnosis: Unsteadiness on feet (R26.81);Muscle weakness (generalized) (M62.81);History of falling (Z91.81);Pain;Difficulty in walking, not elsewhere classified (R26.2) Pain - Right/Left: Right Pain - part of body: Arm;Shoulder     Time: 6578-4696 PT Time Calculation (min) (ACUTE ONLY): 19 min  Charges:  $Therapeutic Activity: 8-22 mins                     Crystal [name] , PTA Acute Rehabilitation Services Pager [phone] Office [phone]    Crystal [name] Crystal [name] 06/20/2021, 5:07 PM

## 2021-06-20 NOTE — Plan of Care (Signed)
  Problem: Education: Goal: Knowledge of General Education information will improve Description: Including pain rating scale, medication(s)/side effects and non-pharmacologic comfort measures Outcome: Adequate for Discharge   Problem: Health Behavior/Discharge Planning: Goal: Ability to manage health-related needs will improve Outcome: Adequate for Discharge   Problem: Clinical Measurements: Goal: Ability to maintain clinical measurements within normal limits will improve Outcome: Adequate for Discharge Goal: Will remain free from infection Outcome: Adequate for Discharge Goal: Diagnostic test results will improve Outcome: Adequate for Discharge Goal: Respiratory complications will improve Outcome: Adequate for Discharge Goal: Cardiovascular complication will be avoided Outcome: Adequate for Discharge   Problem: Activity: Goal: Risk for activity intolerance will decrease Outcome: Adequate for Discharge   Problem: Nutrition: Goal: Adequate nutrition will be maintained Outcome: Adequate for Discharge   Problem: Coping: Goal: Level of anxiety will decrease Outcome: Adequate for Discharge   Problem: Elimination: Goal: Will not experience complications related to bowel motility Outcome: Adequate for Discharge Goal: Will not experience complications related to urinary retention Outcome: Adequate for Discharge   Problem: Pain Managment: Goal: General experience of comfort will improve Outcome: Adequate for Discharge   Problem: Safety: Goal: Ability to remain free from injury will improve Outcome: Adequate for Discharge   Problem: Skin Integrity: Goal: Risk for impaired skin integrity will decrease Outcome: Adequate for Discharge   Problem: Acute Rehab OT Goals (only OT should resolve) Goal: Pt. Will Perform Grooming Outcome: Adequate for Discharge Goal: Pt. Will Perform Upper Body Bathing Outcome: Adequate for Discharge Goal: Pt. Will Perform Upper Body  Dressing Outcome: Adequate for Discharge Goal: Pt. Will Transfer To Toilet Outcome: Adequate for Discharge Goal: Pt. Will Perform Toileting-Clothing Manipulation Outcome: Adequate for Discharge   Problem: Acute Rehab PT Goals(only PT should resolve) Goal: Pt Will Go Supine/Side To Sit Outcome: Adequate for Discharge Goal: Pt Will Transfer Bed To Chair/Chair To Bed Outcome: Adequate for Discharge Goal: Pt Will Perform Standing Balance Or Pre-Gait Outcome: Adequate for Discharge Goal: Pt Will Ambulate Outcome: Adequate for Discharge Goal: Pt Will Go Up/Down Stairs Outcome: Adequate for Discharge

## 2021-06-20 NOTE — Plan of Care (Signed)

## 2021-06-20 NOTE — Progress Notes (Signed)
Pt IV was removed with catheter intact. AVS discharge on packet for son to receive at home. [facility] has been called for transport. Pt is confused.

## 2021-06-20 NOTE — TOC Transition Note (Addendum)
Transition of Care [facility]) - CM/SW Discharge Note   Patient Details  Name: Crystal [name] MRN: [id number] Date of Birth: 11-06-1925  Transition of [facility]) CM/SW Contact:  Sharin Mons, RN Phone Number: 06/20/2021, 3:26 PM   Clinical Narrative:    Patient will DC to: home  Anticipated DC date: 06/20/2021 Family notified:son, John Transport by: Corey Harold  Per MD patient ready for DC today. RN, patient, patient's family, and [place] notified of DC, start of care 7/22.    Post hospital f/u noted on AVS. Son without  Rx med concerns or affordability issues.   Bartolo Darter [facility])        [phone]        DC  and transport papers on chart. [facility]/Ambulance transport requested for patient.   RNCM will sign off for now as intervention is no longer needed. Please consult [place] again if new needs arise.   06/20/2021 @ 1650 PT noted  oxygen desaturation with activity. MD made aware. DME: home oxygen ordered. Referral made with [facility] for home oxygen. Oxygen will be delivered to pt's home on tomorrow. Pt will d/c to home once equipment delivered, son Jenny Reichmann made aware.   RNCM will sign off for now as intervention is no longer needed. Please consult [place] again if new needs arise.   Final next level of care: [place] Barriers to Discharge: No Barriers Identified   Patient Goals and CMS Choice     Choice offered to / list presented to : Adult Children  Discharge Placement        Discharge Plan and Services   Discharge Planning Services: CM Consult                  HH Arranged: PT, RN [facility] Agency: [place] Date [facility] Agency Contacted: 06/20/21 Time [place]: [id number] Representative spoke with at [place]: [place] Determinants of Health ([place]) Interventions     Readmission Risk Interventions No flowsheet data found.

## 2021-06-20 NOTE — Assessment & Plan Note (Signed)
-   likely due to compressive atelectasis - need to encourage spirometry use some - continue O2, will need at discharge

## 2021-06-20 NOTE — Progress Notes (Signed)
SATURATION QUALIFICATIONS: (This note is used to comply with regulatory documentation for home oxygen)  Patient Saturations on Room Air at Rest = 87%  Patient Saturations on Room Air while Standing= 81% (unable to walk)  Patient Saturations on 1 Liters of oxygen while Standing = 92% (Unable to walk)  Please briefly explain why patient needs home oxygen: Desat with activity

## 2021-06-20 NOTE — Progress Notes (Addendum)
ANTICOAGULATION CONSULT NOTE  Pharmacy Consult for Coumadin Indication:  History of PE/DVT  Not on File  Patient Measurements: Height: 5\' 6"  (167.6 cm) Weight: 94.2 kg (207 lb 10.8 oz) IBW/kg (Calculated) : 59.3  Vital Signs: Temp: 98 F (36.7 C) (07/20 0752) Temp Source: Oral (07/20 0752) BP: 156/78 (07/20 0752) Pulse Rate: 68 (07/20 0752)  Labs: Recent Labs    06/17/21 1216 06/17/21 1420 06/18/21 0249 06/19/21 0209 06/19/21 0827 06/20/21 0153  HGB 12.4  --  10.9* 10.7*  --  11.6*  HCT 40.9  --  36.2 34.4*  --  37.1  PLT 274  --  245 257  --  251  LABPROT 85.3* 83.1* 46.8*  --  21.8* 19.3*  INR >10.0* >10.0* 5.1*  --  1.9* 1.6*  CREATININE 1.40*  --  1.20* 1.13*  --  0.96  TROPONINIHS 6 7  --   --   --   --      Estimated Creatinine Clearance: 42.4 mL/min (by C-G formula based on SCr of 0.96 mg/dL).   Medical History: Past Medical History:  Diagnosis Date   Class 1 obesity due to excess calories with body mass index (BMI) of 33.0 to 33.9 in adult 06/17/2021   COPD (chronic obstructive pulmonary disease) (HCC)    Hypertension    Hypothyroidism (acquired)    Polio    residual RUE weakness   Restless leg syndrome    Uterine cancer (HCC)    remote   VTE (venous thromboembolism)    lifelong AC    Assessment: 87 YOF presented s/p fall with R arm deformity.  Head CT negative.  Pharmacy consulted to continue Coumadin from PTA for history of DVT and PE.  INR > 10 on admission and patient received Vit K 5mg  on 7/17.  Patient unsure of home dose, any recent dose changes nor new meds that could interact with Coumadin.  INR currently sub-therapeutic at 1.6.  No overt bleeding reported.  Goal of Therapy:  INR 2-3 Monitor platelets by anticoagulation protocol: Yes   Plan:  Repeat Coumadin 3mg  PO today Daily PT / INR  Possible discharge today - recommend resuming home regimen and have INR checked on Fri 7/22.  Mehtaab Mayeda D. Mina Marble, PharmD, BCPS, [place] 06/20/2021,  10:17 AM

## 2021-06-20 NOTE — Progress Notes (Addendum)
Progress Note    Lenny Bouchillon   GNF:[id number]  DOB: 04-29-1925  DOA: 06/17/2021     2  PCP: Lajean Manes, MD  CC: fall at home  Hospital Course: Ms. 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  She is cared for at home by her son and has not left the house much recently including for her INR checks.  On admission, INR was greater than 10 with no evidence of bleeding.  She underwent administration of vitamin K and Coumadin was held. In regards to her fall, she underwent imaging on admission and was found to have an acute comminuted fracture of the mid humeral diaphysis on her right arm.  She was evaluated by orthopedic surgery and recommended for a clamshell brace.  Interval History:  No events overnight. Brace in place on arm. Slightly more confused than yesterday.   ROS: Review of systems not obtained due to patient factors.  Cognitive impairment  Assessment & Plan: * Comminuted right humeral fracture - Acute comminuted fracture of the mid humeral diaphysis - nonoperative management, sling as well as Sarmiento clamshell brace through [facility] - likely not much rehab potential and son wishes to bring patient home at time of discharge and is declining rehab placement  Acute respiratory failure with hypoxia ([place]) - likely due to compressive atelectasis - need to encourage spirometry use some - continue O2, will need at discharge   History of DVT (deep vein thrombosis) - Discussed other anticoagulation options with her son on the phone.  Given ongoing fall risk, he wishes to continue on Coumadin but is requesting home health for INR checks as he is unable to take her to the appointments as easily due to her severe immobility -INR has been reversed with vitamin K on admission - INR down to 1.6 on day of discharge - Home dose Coumadin  resumed -Home health RN ordered at discharge for serial INR checks  Supratherapeutic INR-resolved as of 06/20/2021 - INR greater than 10 on admission.  No overt bleeding.  Reversed with vitamin K -Son has difficulty getting her to outpatient INR appointments - Home health will be requested for continuing INR checks at discharge  Abnormal chest x-ray on admission chest x-ray showed Reticulonodular densities in the right midlung, suspicious for bronchiolitis/atypical infection.  She received ceftriaxone azithromycin in the ED, she is asymptomatic, afebrile, hold further antibiotics -Repeat CXR in 4 to 6 weeks  Class 1 obesity due to excess calories with body mass index (BMI) of 33.0 to 33.9 in adult -Body mass index is 33.52 kg/m.  Hypothyroidism - Continue Synthroid   Old records reviewed in assessment of this patient  Antimicrobials:   DVT prophylaxis:   Coumadin   Code Status:   Code Status: Full Code Family Communication: Son  Disposition Plan: Status is: Inpatient  Remains inpatient appropriate because:Ongoing active pain requiring inpatient pain management and Inpatient level of care appropriate due to severity of illness  Dispo: The patient is from: Home              Anticipated d/c is to: Home              Patient currently is not medically stable to d/c.   Difficult to place patient No  Risk of unplanned readmission score: Unplanned Admission- Pilot do not use: 17.21   Objective: Blood pressure (!) 147/71, pulse 80,  temperature 98 F (36.7 C), temperature source Oral, resp. rate 17, height 5\' 6"  (1.676 m), weight 94.2 kg, SpO2 95 %.  Examination: General appearance: alert, cooperative, no distress, and slowed mentation Head: Normocephalic, without obvious abnormality, atraumatic Eyes:  EOMI Lungs: clear to auscultation bilaterally Heart: regular rate and rhythm and S1, S2 normal Abdomen: normal findings: bowel sounds normal and soft, non-tender Extremities:   Right arm noted with bruising and malrotated right hand  brace in place  Skin: mobility and turgor normal Neurologic: Follows commands, moves all 4 extremities.  Dense right upper extremity weakness noted  Consultants:    Procedures:    Data Reviewed: I have personally reviewed following labs and imaging studies Results for orders placed or performed during the hospital encounter of 06/17/21 (from the past 24 hour(s))  Protime-INR     Status: Abnormal   Collection Time: 06/20/21  1:53 AM  Result Value Ref Range   Prothrombin Time 19.3 (H) 11.4 - 15.2 seconds   INR 1.6 (H) 0.8 - 1.2  Basic metabolic panel     Status: Abnormal   Collection Time: 06/20/21  1:53 AM  Result Value Ref Range   Sodium 139 135 - 145 mmol/L   Potassium 4.6 3.5 - 5.1 mmol/L   Chloride 101 98 - 111 mmol/L   CO2 32 22 - 32 mmol/L   Glucose, Bld 105 (H) 70 - 99 mg/dL   BUN 27 (H) 8 - 23 mg/dL   Creatinine, Ser 0.96 0.44 - 1.00 mg/dL   Calcium 8.4 (L) 8.9 - 10.3 mg/dL   GFR, Estimated 55 (L) >60 mL/min   Anion gap 6 5 - 15  CBC with Differential/Platelet     Status: Abnormal   Collection Time: 06/20/21  1:53 AM  Result Value Ref Range   WBC 12.8 (H) 4.0 - 10.5 K/uL   RBC 3.71 (L) 3.87 - 5.11 MIL/uL   Hemoglobin 11.6 (L) 12.0 - 15.0 g/dL   HCT 37.1 36.0 - 46.0 %   MCV 100.0 80.0 - 100.0 fL   MCH 31.3 26.0 - 34.0 pg   MCHC 31.3 30.0 - 36.0 g/dL   RDW 15.6 (H) 11.5 - 15.5 %   Platelets 251 150 - 400 K/uL   nRBC 0.0 0.0 - 0.2 %   Neutrophils Relative % 55 %   Neutro Abs 7.0 1.7 - 7.7 K/uL   Lymphocytes Relative 37 %   Lymphs Abs 4.7 (H) 0.7 - 4.0 K/uL   Monocytes Relative 7 %   Monocytes Absolute 0.9 0.1 - 1.0 K/uL   Eosinophils Relative 1 %   Eosinophils Absolute 0.1 0.0 - 0.5 K/uL   Basophils Relative 0 %   Basophils Absolute 0.0 0.0 - 0.1 K/uL   Immature Granulocytes 0 %   Abs Immature Granulocytes 0.05 0.00 - 0.07 K/uL  Magnesium     Status: None   Collection Time: 06/20/21  1:53 AM  Result  Value Ref Range   Magnesium 2.2 1.7 - 2.4 mg/dL    Recent Results (from the past 240 hour(s))  Resp Panel by RT-PCR (Flu A&B, Covid) Nasopharyngeal Swab     Status: None   Collection Time: 06/17/21 12:19 PM   Specimen: Nasopharyngeal Swab; Nasopharyngeal(NP) swabs in vial transport medium  Result Value Ref Range Status   SARS Coronavirus 2 by RT PCR NEGATIVE NEGATIVE Final    Comment: (NOTE) SARS-CoV-2 target nucleic acids are NOT DETECTED.  The SARS-CoV-2 RNA is generally detectable in upper respiratory specimens during  the acute phase of infection. The lowest concentration of SARS-CoV-2 viral copies this assay can detect is 138 copies/mL. A negative result does not preclude SARS-Cov-2 infection and should not be used as the sole basis for treatment or other patient management decisions. A negative result may occur with  improper specimen collection/handling, submission of specimen other than nasopharyngeal swab, presence of viral mutation(s) within the areas targeted by this assay, and inadequate number of viral copies(<138 copies/mL). A negative result must be combined with clinical observations, patient history, and epidemiological information. The expected result is Negative.  Fact Sheet for Patients:  EntrepreneurPulse.com.au  Fact Sheet for Healthcare Providers:  IncredibleEmployment.be  This test is no t yet approved or cleared by the [place] FDA and  has been authorized for detection and/or diagnosis of SARS-CoV-2 by FDA under an Emergency Use Authorization (EUA). This EUA will remain  in effect (meaning this test can be used) for the duration of the COVID-19 declaration under Section 564(b)(1) of the Act, 21 U.S.C.section 360bbb-3(b)(1), unless the authorization is terminated  or revoked sooner.       Influenza A by PCR NEGATIVE NEGATIVE Final   Influenza B by PCR NEGATIVE NEGATIVE Final    Comment: (NOTE) The Xpert Xpress  SARS-CoV-2/FLU/RSV plus assay is intended as an aid in the diagnosis of influenza from Nasopharyngeal swab specimens and should not be used as a sole basis for treatment. Nasal washings and aspirates are unacceptable for Xpert Xpress SARS-CoV-2/FLU/RSV testing.  Fact Sheet for Patients: EntrepreneurPulse.com.au  Fact Sheet for Healthcare Providers: IncredibleEmployment.be  This test is not yet approved or cleared by the [place] FDA and has been authorized for detection and/or diagnosis of SARS-CoV-2 by FDA under an Emergency Use Authorization (EUA). This EUA will remain in effect (meaning this test can be used) for the duration of the COVID-19 declaration under Section 564(b)(1) of the Act, 21 U.S.C. section 360bbb-3(b)(1), unless the authorization is terminated or revoked.  Performed at [facility], [address] [place], [place] [address]      Radiology Studies: No results found. CT Head Wo Contrast  Final Result    DG Knee Complete 4 Views Right  Final Result    DG Forearm Right  Final Result    DG Knee Complete 4 Views Left  Final Result    DG Pelvis 1-2 Views  Final Result    DG Wrist Complete Right  Final Result    DG Chest Portable 1 View  Final Result    DG Humerus Right  Final Result      Scheduled Meds:  docusate sodium  100 mg Oral BID   gabapentin  400 mg Oral BID   latanoprost  1 drop Both Eyes QHS   levothyroxine  75 mcg Oral QAC breakfast   mirtazapine  30 mg Oral QHS   pantoprazole  40 mg Oral Daily   sodium chloride flush  3 mL Intravenous Q12H   Vitamin D (Ergocalciferol)  50,000 Units Oral Q14 Days   Warfarin - Pharmacist Dosing Inpatient   Does not apply q1600   PRN Meds: acetaminophen **OR** acetaminophen, bisacodyl, hydrALAZINE, HYDROcodone-acetaminophen, morphine injection, polyethylene glycol Continuous Infusions:   LOS: 2 days  Time spent: Greater than 50% of the 35 minute  visit was spent in counseling/coordination of care for the patient as laid out in the A&P.   Dwyane Dee, MD [facility] 06/20/2021, 4:59 PM

## 2021-06-21 ENCOUNTER — Other Ambulatory Visit (HOSPITAL_COMMUNITY): Payer: Self-pay

## 2021-06-21 DIAGNOSIS — S42351A Displaced comminuted fracture of shaft of humerus, right arm, initial encounter for closed fracture: Secondary | ICD-10-CM | POA: Diagnosis not present

## 2021-06-21 DIAGNOSIS — Z86718 Personal history of other venous thrombosis and embolism: Secondary | ICD-10-CM

## 2021-06-21 DIAGNOSIS — R791 Abnormal coagulation profile: Secondary | ICD-10-CM | POA: Diagnosis not present

## 2021-06-21 LAB — CBC WITH DIFFERENTIAL/PLATELET
Abs Immature Granulocytes: 0.05 10*3/uL (ref 0.00–0.07)
Basophils Absolute: 0 10*3/uL (ref 0.0–0.1)
Basophils Relative: 0 %
Eosinophils Absolute: 0.1 10*3/uL (ref 0.0–0.5)
Eosinophils Relative: 1 %
HCT: 35.9 % — ABNORMAL LOW (ref 36.0–46.0)
Hemoglobin: 11.4 g/dL — ABNORMAL LOW (ref 12.0–15.0)
Immature Granulocytes: 0 %
Lymphocytes Relative: 36 %
Lymphs Abs: 4 10*3/uL (ref 0.7–4.0)
MCH: 31.5 pg (ref 26.0–34.0)
MCHC: 31.8 g/dL (ref 30.0–36.0)
MCV: 99.2 fL (ref 80.0–100.0)
Monocytes Absolute: 0.8 10*3/uL (ref 0.1–1.0)
Monocytes Relative: 7 %
Neutro Abs: 6.2 10*3/uL (ref 1.7–7.7)
Neutrophils Relative %: 56 %
Platelets: 285 10*3/uL (ref 150–400)
RBC: 3.62 MIL/uL — ABNORMAL LOW (ref 3.87–5.11)
RDW: 15.6 % — ABNORMAL HIGH (ref 11.5–15.5)
WBC: 11.7 10*3/uL — ABNORMAL HIGH (ref 4.0–10.5)
nRBC: 0 % (ref 0.0–0.2)

## 2021-06-21 LAB — BASIC METABOLIC PANEL
Anion gap: 7 (ref 5–15)
BUN: 23 mg/dL (ref 8–23)
CO2: 34 mmol/L — ABNORMAL HIGH (ref 22–32)
Calcium: 8.5 mg/dL — ABNORMAL LOW (ref 8.9–10.3)
Chloride: 98 mmol/L (ref 98–111)
Creatinine, Ser: 1 mg/dL (ref 0.44–1.00)
GFR, Estimated: 53 mL/min — ABNORMAL LOW (ref >60)
Glucose, Bld: 91 mg/dL (ref 70–99)
Potassium: 4.2 mmol/L (ref 3.5–5.1)
Sodium: 139 mmol/L (ref 135–145)

## 2021-06-21 LAB — MAGNESIUM: Magnesium: 2.2 mg/dL (ref 1.7–2.4)

## 2021-06-21 LAB — PROTIME-INR
INR: 1.7 — ABNORMAL HIGH (ref 0.8–1.2)
Prothrombin Time: 19.7 seconds — ABNORMAL HIGH (ref 11.4–15.2)

## 2021-06-21 MED ORDER — APIXABAN 5 MG PO TABS
5.0000 mg | ORAL_TABLET | Freq: Two times a day (BID) | ORAL | Status: DC
  Administered 2021-06-21: 5 mg via ORAL
  Filled 2021-06-21: qty 1

## 2021-06-21 MED ORDER — APIXABAN 5 MG PO TABS
5.0000 mg | ORAL_TABLET | Freq: Two times a day (BID) | ORAL | 11 refills | Status: AC
  Filled 2021-06-21: qty 60, 30d supply, fill #0

## 2021-06-21 MED ORDER — COVID-19 MRNA VAC-TRIS(PFIZER) 30 MCG/0.3ML IM SUSP
0.3000 mL | Freq: Once | INTRAMUSCULAR | Status: AC
  Administered 2021-06-21: 0.3 mL via INTRAMUSCULAR
  Filled 2021-06-21: qty 0.3

## 2021-06-21 NOTE — Progress Notes (Signed)
Pt 's DME oxygen delivered by [employer] to pt's home finally. [facility] called for pt's transportation to home, Jenny Reichmann( son) made aware. Whitman Hero RN,BSN,CM

## 2021-06-21 NOTE — Care Management Important Message (Signed)
Important Message  Patient Details  Name: Crystal [name] MRN: [id number] Date of Birth: 1925/03/27   Medicare Important Message Given:  Yes     [name] Circle 06/21/2021, 4:26 PM

## 2021-06-21 NOTE — Progress Notes (Signed)
Pt son Jenny Reichmann was made aware that [facility] has been called for transport home. Oxygen was delivered to pt house finally. He was made aware that it may be later before she gets home.

## 2021-06-21 NOTE — Progress Notes (Addendum)
[place] for Coumadin >> apixaban Indication:  History of PE/DVT  Not on File  Patient Measurements: Height: 5\' 6"  (167.6 cm) Weight: 94.2 kg (207 lb 10.8 oz) IBW/kg (Calculated) : 59.3  Vital Signs: BP: 133/49 (07/21 0807) Pulse Rate: 82 (07/21 0807)  Labs: Recent Labs    06/19/21 0209 06/19/21 0827 06/20/21 0153 06/21/21 0353  HGB 10.7*  --  11.6* 11.4*  HCT 34.4*  --  37.1 35.9*  PLT 257  --  251 285  LABPROT  --  21.8* 19.3* 19.7*  INR  --  1.9* 1.6* 1.7*  CREATININE 1.13*  --  0.96 1.00     Estimated Creatinine Clearance: 40.7 mL/min (by C-G formula based on SCr of 1 mg/dL).   Medical History: Past Medical History:  Diagnosis Date   Class 1 obesity due to excess calories with body mass index (BMI) of 33.0 to 33.9 in adult 06/17/2021   COPD (chronic obstructive pulmonary disease) (HCC)    Hypertension    Hypothyroidism (acquired)    Polio    residual RUE weakness   Restless leg syndrome    Uterine cancer (HCC)    remote   VTE (venous thromboembolism)    lifelong AC    Assessment: 34 YOF presented s/p fall with R arm deformity.  Head CT negative.  Pharmacy consulted to continue Coumadin from PTA for history of DVT and PE.  Called son, Jenny Reichmann to discuss warfarin home regimen. Found fluconazole was started outpatient prior to admission. Educated on importance of informing all providers that she is taking warfarin. Dicussed DOAC option. Son reports prior history of doac (potentially eliquis?) that caused upset stomach, and was switched to warfarin. Son agree to retrial. Provider aware.   Nurse noted no signs of bleeding.  Goal of Therapy:  INR 2-3 Monitor platelets by anticoagulation protocol: Yes   Plan:  Stop warfarin Start Eliquis 5mg  BID per Dr. Sabino Gasser Discussed medication change and monitoring with son.    Thank you for allowing pharmacy to participate in this patient's care.   Levonne Spiller, PharmD PGY1  Acute Care Resident  June 21, 2021   Remigio Eisenmenger D. Mina Marble, PharmD, BCPS, [place] 06/21/2021, 10:42 AM

## 2021-06-21 NOTE — Progress Notes (Signed)
Physical Therapy Treatment Patient Details Name: Crystal [name] MRN: [id number] DOB: 1925/02/04 Today's Date: 06/21/2021    History of Present Illness Pt is 87 yo female admitted on 06/17/21 with fall  at home sustained a R acute comminuted fracture of the mid humeral diaphysis, resulting in NWB status in Sarmiento brace, non-operative treatment.  Pt with highly fluctuating INR values. Of note - Humerus fx in on arm that was affected by polio as a child, so was largely non-functional at baseline. PMHx:  COPD, polio, hyperthyroidism, remote uterine CA, PE, DVT, falls    PT Comments    Pt supine in bed this session.  Pt required increased time to wake but appears to be a little groggy as tx progressed.  She required increased assistance but was able to participate in LE exercises.  Plan for return home with HHPT as family continues to refuse snf placement.    Follow Up Recommendations  SNF (family refusing snf will require 24 hr assistance and HHPT.)     Equipment Recommendations       Recommendations for Other Services       Precautions / Restrictions Precautions Precautions: Fall Required Braces or Orthoses: Other Brace Other Brace: Sarmiento Brace from [employer] Restrictions Weight Bearing Restrictions: Yes RUE Weight Bearing: Non weight bearing Other Position/Activity Restrictions: Pt keeps removing humeral shaft fracture brace.    Mobility  Bed Mobility Overal bed mobility: Needs Assistance Bed Mobility: Supine to Sit;Sit to Supine     Supine to sit: Max assist;+2 for safety/equipment     General bed mobility comments: Cues for hand placement to and from seated surface.  Pt reaching with LUE for support but lacks core strength to rise into sitting.  Pt required heavy max assistance to elevate trunk into a seated position.    Transfers Overall transfer level: Needs assistance Equipment used: Ambulation equipment used (sara stedy with LUE) Transfers: Sit to/from  Stand Sit to Stand: Mod assist;+2 physical assistance;Max assist (assist level varried based on patient participation.) Stand pivot transfers: Mod assist;+2 physical assistance       General transfer comment: sit to stand in sara stedy x 2.  Pt required increased assistance as she appears more groggy and fatigued.  Able to extend hips on first attempt but hips very flexed on additional attempts.  Ambulation/Gait Ambulation/Gait assistance:  (Unable.)               Stairs             Wheelchair Mobility    Modified Rankin (Stroke Patients Only)       Balance Overall balance assessment: Needs assistance Sitting-balance support: Feet supported Sitting balance-Leahy Scale: Fair (On B feet were supported without B foot support presents with posterior LOB.)   Postural control: Posterior lean;Right lateral lean   Standing balance-Leahy Scale: Poor Standing balance comment: Heavy external assistance to maintain standing.                            Cognition Arousal/Alertness: Awake/alert Behavior During Therapy: WFL for tasks assessed/performed Overall Cognitive Status: History of cognitive impairments - at baseline                                 General Comments: Pt did not reports hallucinations this session and was confused but appropriate.      Exercises General Exercises - Lower Extremity Ankle  Circles/Pumps: PROM;Both;10 reps;Supine Heel Slides: AAROM;Both;10 reps;Supine Straight Leg Raises: AAROM;Both;10 reps;Supine    General Comments        Pertinent Vitals/Pain Pain Assessment: Faces Faces Pain Scale: Hurts even more Pain Location: R UE with movement/when placeing brace Pain Descriptors / Indicators: Sharp Pain Intervention(s): Monitored during session;Repositioned    Home Living                      Prior Function            PT Goals (current goals can now be found in the care plan section) Acute Rehab  PT Goals Patient Stated Goal: get better, go home Potential to Achieve Goals: Fair Progress towards PT goals: Progressing toward goals    Frequency    Min 3X/week      PT Plan Current plan remains appropriate    Co-evaluation              AM-PAC PT "6 Clicks" Mobility   Outcome Measure  Help needed turning from your back to your side while in a flat bed without using bedrails?: A Lot Help needed moving from lying on your back to sitting on the side of a flat bed without using bedrails?: A Lot Help needed moving to and from a bed to a chair (including a wheelchair)?: A Lot Help needed standing up from a chair using your arms (e.g., wheelchair or bedside chair)?: A Lot Help needed to walk in hospital room?: Total Help needed climbing 3-5 steps with a railing? : Total 6 Click Score: 10    End of Session Equipment Utilized During Treatment: Gait belt Activity Tolerance: Patient limited by fatigue;Patient limited by pain Patient left: in bed;with call bell/phone within reach;with bed alarm set (in chair position.) Nurse Communication: Mobility status PT Visit Diagnosis: Unsteadiness on feet (R26.81);Muscle weakness (generalized) (M62.81);History of falling (Z91.81);Pain;Difficulty in walking, not elsewhere classified (R26.2) Pain - Right/Left: Right Pain - part of body: Arm;Shoulder     Time: 1229-1257 PT Time Calculation (min) (ACUTE ONLY): 28 min  Charges:  $Therapeutic Exercise: 8-22 mins $Therapeutic Activity: 8-22 mins                     Erasmo Leventhal , PTA Acute Rehabilitation Services Pager [phone] Office [phone]    Crystal [name] Eli Hose 06/21/2021, 1:15 PM

## 2021-06-22 ENCOUNTER — Encounter (HOSPITAL_COMMUNITY): Payer: Self-pay

## 2021-06-22 DIAGNOSIS — Z86711 Personal history of pulmonary embolism: Secondary | ICD-10-CM | POA: Diagnosis not present

## 2021-06-22 DIAGNOSIS — Z87891 Personal history of nicotine dependence: Secondary | ICD-10-CM | POA: Diagnosis not present

## 2021-06-22 DIAGNOSIS — S42351D Displaced comminuted fracture of shaft of humerus, right arm, subsequent encounter for fracture with routine healing: Secondary | ICD-10-CM | POA: Diagnosis not present

## 2021-06-22 DIAGNOSIS — Z9181 History of falling: Secondary | ICD-10-CM | POA: Diagnosis not present

## 2021-06-22 DIAGNOSIS — I2510 Atherosclerotic heart disease of native coronary artery without angina pectoris: Secondary | ICD-10-CM | POA: Diagnosis not present

## 2021-06-22 DIAGNOSIS — K219 Gastro-esophageal reflux disease without esophagitis: Secondary | ICD-10-CM | POA: Diagnosis not present

## 2021-06-22 DIAGNOSIS — Z7901 Long term (current) use of anticoagulants: Secondary | ICD-10-CM | POA: Diagnosis not present

## 2021-06-22 DIAGNOSIS — I10 Essential (primary) hypertension: Secondary | ICD-10-CM | POA: Diagnosis not present

## 2021-06-22 DIAGNOSIS — J449 Chronic obstructive pulmonary disease, unspecified: Secondary | ICD-10-CM | POA: Diagnosis not present

## 2021-06-22 DIAGNOSIS — Z86718 Personal history of other venous thrombosis and embolism: Secondary | ICD-10-CM | POA: Diagnosis not present

## 2021-06-22 DIAGNOSIS — R251 Tremor, unspecified: Secondary | ICD-10-CM | POA: Diagnosis not present

## 2021-06-22 DIAGNOSIS — E039 Hypothyroidism, unspecified: Secondary | ICD-10-CM | POA: Diagnosis not present

## 2021-06-25 DIAGNOSIS — K219 Gastro-esophageal reflux disease without esophagitis: Secondary | ICD-10-CM | POA: Diagnosis not present

## 2021-06-25 DIAGNOSIS — Z9181 History of falling: Secondary | ICD-10-CM | POA: Diagnosis not present

## 2021-06-25 DIAGNOSIS — R251 Tremor, unspecified: Secondary | ICD-10-CM | POA: Diagnosis not present

## 2021-06-25 DIAGNOSIS — S42351D Displaced comminuted fracture of shaft of humerus, right arm, subsequent encounter for fracture with routine healing: Secondary | ICD-10-CM | POA: Diagnosis not present

## 2021-06-25 DIAGNOSIS — I2510 Atherosclerotic heart disease of native coronary artery without angina pectoris: Secondary | ICD-10-CM | POA: Diagnosis not present

## 2021-06-25 DIAGNOSIS — E039 Hypothyroidism, unspecified: Secondary | ICD-10-CM | POA: Diagnosis not present

## 2021-06-25 DIAGNOSIS — Z86718 Personal history of other venous thrombosis and embolism: Secondary | ICD-10-CM | POA: Diagnosis not present

## 2021-06-25 DIAGNOSIS — Z87891 Personal history of nicotine dependence: Secondary | ICD-10-CM | POA: Diagnosis not present

## 2021-06-25 DIAGNOSIS — I10 Essential (primary) hypertension: Secondary | ICD-10-CM | POA: Diagnosis not present

## 2021-06-25 DIAGNOSIS — Z7901 Long term (current) use of anticoagulants: Secondary | ICD-10-CM | POA: Diagnosis not present

## 2021-06-25 DIAGNOSIS — J449 Chronic obstructive pulmonary disease, unspecified: Secondary | ICD-10-CM | POA: Diagnosis not present

## 2021-06-25 DIAGNOSIS — Z86711 Personal history of pulmonary embolism: Secondary | ICD-10-CM | POA: Diagnosis not present

## 2021-06-27 ENCOUNTER — Telehealth (HOSPITAL_COMMUNITY): Payer: Self-pay

## 2021-06-27 ENCOUNTER — Other Ambulatory Visit (HOSPITAL_COMMUNITY): Payer: Self-pay

## 2021-06-27 DIAGNOSIS — I10 Essential (primary) hypertension: Secondary | ICD-10-CM | POA: Diagnosis not present

## 2021-06-27 DIAGNOSIS — Z7901 Long term (current) use of anticoagulants: Secondary | ICD-10-CM | POA: Diagnosis not present

## 2021-06-27 DIAGNOSIS — J449 Chronic obstructive pulmonary disease, unspecified: Secondary | ICD-10-CM | POA: Diagnosis not present

## 2021-06-27 DIAGNOSIS — S42351D Displaced comminuted fracture of shaft of humerus, right arm, subsequent encounter for fracture with routine healing: Secondary | ICD-10-CM | POA: Diagnosis not present

## 2021-06-27 DIAGNOSIS — Z87891 Personal history of nicotine dependence: Secondary | ICD-10-CM | POA: Diagnosis not present

## 2021-06-27 DIAGNOSIS — Z9181 History of falling: Secondary | ICD-10-CM | POA: Diagnosis not present

## 2021-06-27 DIAGNOSIS — Z86711 Personal history of pulmonary embolism: Secondary | ICD-10-CM | POA: Diagnosis not present

## 2021-06-27 DIAGNOSIS — I2510 Atherosclerotic heart disease of native coronary artery without angina pectoris: Secondary | ICD-10-CM | POA: Diagnosis not present

## 2021-06-27 DIAGNOSIS — Z86718 Personal history of other venous thrombosis and embolism: Secondary | ICD-10-CM | POA: Diagnosis not present

## 2021-06-27 DIAGNOSIS — K219 Gastro-esophageal reflux disease without esophagitis: Secondary | ICD-10-CM | POA: Diagnosis not present

## 2021-06-27 DIAGNOSIS — E039 Hypothyroidism, unspecified: Secondary | ICD-10-CM | POA: Diagnosis not present

## 2021-06-27 DIAGNOSIS — R251 Tremor, unspecified: Secondary | ICD-10-CM | POA: Diagnosis not present

## 2021-06-27 NOTE — Telephone Encounter (Signed)
Pharmacy Transitions of Care Follow-up Telephone Call  Date of discharge: 06/21/21  Discharge Diagnosis: Right humerus fracture  How have you been since you were released from the hospital? Spoke to patient's son Jenny Reichmann on the phone since patient was in bed. She has been in bed since discharge but OT and PT has been to her house for sessions.   Medication changes made at discharge:     START taking: acetaminophen (TYLENOL)  Eliquis (apixaban)  oxyCODONE (Roxicodone)   STOP taking: warfarin 2 MG tablet (COUMADIN)  warfarin 3 MG tablet (COUMADIN)   Medication changes verified by the patient? Yes    Medication Accessibility:  Home Pharmacy:  [facility] [place]  Was the patient provided with refills on discharged medications? Yes   Have all prescriptions been transferred from [facility] to home pharmacy?  Yes  Is the patient able to afford medications? Patient has AARPMPD    Medication Review:  APIXABAN (ELIQUIS)  Apixaban 5 mg BID initiated on 06/21/21.  - Discussed importance of taking medication around the same time everyday  - Advised patient of medications to avoid (NSAIDs, ASA)  - Educated that Tylenol (acetaminophen) will be the preferred analgesic to prevent risk of bleeding  - Emphasized importance of monitoring for signs and symptoms of bleeding (abnormal bruising, prolonged bleeding, nose bleeds, bleeding from gums, discolored urine, black tarry stools)  - Advised patient to alert all providers of anticoagulation therapy prior to starting a new medication or having a procedure   Follow-up Appointments:  PCP Hospital f/u appt confirmed? Dr. Felipa Eth is PCP, currently trying to communicate with PCP and [facility] f/u appt confirmed? [facility] is coming to patient house frequently for OT and PT  If their condition worsens, is the pt aware to call PCP or go to the Emergency Dept.? Yes  Final Patient  Assessment: Patient has refills at home pharmacy and being followed by provider

## 2021-06-29 ENCOUNTER — Inpatient Hospital Stay (HOSPITAL_COMMUNITY): Payer: Medicare Other

## 2021-06-29 ENCOUNTER — Other Ambulatory Visit: Payer: Self-pay

## 2021-06-29 ENCOUNTER — Emergency Department (HOSPITAL_COMMUNITY): Payer: Medicare Other

## 2021-06-29 ENCOUNTER — Inpatient Hospital Stay (HOSPITAL_COMMUNITY)
Admission: EM | Admit: 2021-06-29 | Discharge: 2021-08-02 | DRG: 871 | Disposition: E | Payer: Medicare Other | Attending: Internal Medicine | Admitting: Internal Medicine

## 2021-06-29 DIAGNOSIS — R402431 Glasgow coma scale score 3-8, in the field [EMT or ambulance]: Secondary | ICD-10-CM

## 2021-06-29 DIAGNOSIS — Z66 Do not resuscitate: Secondary | ICD-10-CM | POA: Diagnosis present

## 2021-06-29 DIAGNOSIS — Z96652 Presence of left artificial knee joint: Secondary | ICD-10-CM | POA: Diagnosis not present

## 2021-06-29 DIAGNOSIS — N179 Acute kidney failure, unspecified: Secondary | ICD-10-CM | POA: Insufficient documentation

## 2021-06-29 DIAGNOSIS — Z743 Need for continuous supervision: Secondary | ICD-10-CM | POA: Diagnosis not present

## 2021-06-29 DIAGNOSIS — I499 Cardiac arrhythmia, unspecified: Secondary | ICD-10-CM | POA: Diagnosis not present

## 2021-06-29 DIAGNOSIS — Z803 Family history of malignant neoplasm of breast: Secondary | ICD-10-CM

## 2021-06-29 DIAGNOSIS — Z79890 Hormone replacement therapy (postmenopausal): Secondary | ICD-10-CM

## 2021-06-29 DIAGNOSIS — Z8612 Personal history of poliomyelitis: Secondary | ICD-10-CM | POA: Diagnosis not present

## 2021-06-29 DIAGNOSIS — R404 Transient alteration of awareness: Secondary | ICD-10-CM | POA: Diagnosis not present

## 2021-06-29 DIAGNOSIS — J90 Pleural effusion, not elsewhere classified: Secondary | ICD-10-CM | POA: Diagnosis not present

## 2021-06-29 DIAGNOSIS — I472 Ventricular tachycardia: Secondary | ICD-10-CM | POA: Diagnosis not present

## 2021-06-29 DIAGNOSIS — I10 Essential (primary) hypertension: Secondary | ICD-10-CM | POA: Diagnosis present

## 2021-06-29 DIAGNOSIS — G928 Other toxic encephalopathy: Secondary | ICD-10-CM | POA: Diagnosis present

## 2021-06-29 DIAGNOSIS — K219 Gastro-esophageal reflux disease without esophagitis: Secondary | ICD-10-CM | POA: Diagnosis present

## 2021-06-29 DIAGNOSIS — R4182 Altered mental status, unspecified: Secondary | ICD-10-CM | POA: Diagnosis present

## 2021-06-29 DIAGNOSIS — G2581 Restless legs syndrome: Secondary | ICD-10-CM | POA: Diagnosis present

## 2021-06-29 DIAGNOSIS — Z515 Encounter for palliative care: Secondary | ICD-10-CM | POA: Diagnosis not present

## 2021-06-29 DIAGNOSIS — E039 Hypothyroidism, unspecified: Secondary | ICD-10-CM | POA: Diagnosis present

## 2021-06-29 DIAGNOSIS — J80 Acute respiratory distress syndrome: Secondary | ICD-10-CM | POA: Diagnosis not present

## 2021-06-29 DIAGNOSIS — Z87891 Personal history of nicotine dependence: Secondary | ICD-10-CM | POA: Diagnosis not present

## 2021-06-29 DIAGNOSIS — Z20822 Contact with and (suspected) exposure to covid-19: Secondary | ICD-10-CM | POA: Diagnosis present

## 2021-06-29 DIAGNOSIS — Z452 Encounter for adjustment and management of vascular access device: Secondary | ICD-10-CM

## 2021-06-29 DIAGNOSIS — D689 Coagulation defect, unspecified: Secondary | ICD-10-CM | POA: Diagnosis present

## 2021-06-29 DIAGNOSIS — Z86718 Personal history of other venous thrombosis and embolism: Secondary | ICD-10-CM

## 2021-06-29 DIAGNOSIS — I517 Cardiomegaly: Secondary | ICD-10-CM | POA: Diagnosis not present

## 2021-06-29 DIAGNOSIS — R0603 Acute respiratory distress: Secondary | ICD-10-CM | POA: Diagnosis not present

## 2021-06-29 DIAGNOSIS — E872 Acidosis: Secondary | ICD-10-CM | POA: Diagnosis present

## 2021-06-29 DIAGNOSIS — J690 Pneumonitis due to inhalation of food and vomit: Secondary | ICD-10-CM | POA: Diagnosis present

## 2021-06-29 DIAGNOSIS — A408 Other streptococcal sepsis: Secondary | ICD-10-CM | POA: Diagnosis not present

## 2021-06-29 DIAGNOSIS — R54 Age-related physical debility: Secondary | ICD-10-CM | POA: Diagnosis present

## 2021-06-29 DIAGNOSIS — R627 Adult failure to thrive: Secondary | ICD-10-CM | POA: Diagnosis present

## 2021-06-29 DIAGNOSIS — J9601 Acute respiratory failure with hypoxia: Secondary | ICD-10-CM | POA: Diagnosis not present

## 2021-06-29 DIAGNOSIS — Z79899 Other long term (current) drug therapy: Secondary | ICD-10-CM

## 2021-06-29 DIAGNOSIS — R571 Hypovolemic shock: Secondary | ICD-10-CM | POA: Diagnosis not present

## 2021-06-29 DIAGNOSIS — E875 Hyperkalemia: Secondary | ICD-10-CM | POA: Diagnosis present

## 2021-06-29 DIAGNOSIS — J9690 Respiratory failure, unspecified, unspecified whether with hypoxia or hypercapnia: Secondary | ICD-10-CM | POA: Diagnosis not present

## 2021-06-29 DIAGNOSIS — A419 Sepsis, unspecified organism: Secondary | ICD-10-CM

## 2021-06-29 DIAGNOSIS — J449 Chronic obstructive pulmonary disease, unspecified: Secondary | ICD-10-CM | POA: Diagnosis not present

## 2021-06-29 DIAGNOSIS — R34 Anuria and oliguria: Secondary | ICD-10-CM | POA: Diagnosis not present

## 2021-06-29 DIAGNOSIS — R6520 Severe sepsis without septic shock: Secondary | ICD-10-CM | POA: Diagnosis not present

## 2021-06-29 DIAGNOSIS — G629 Polyneuropathy, unspecified: Secondary | ICD-10-CM | POA: Diagnosis present

## 2021-06-29 DIAGNOSIS — R6521 Severe sepsis with septic shock: Secondary | ICD-10-CM | POA: Diagnosis present

## 2021-06-29 DIAGNOSIS — G8929 Other chronic pain: Secondary | ICD-10-CM | POA: Diagnosis present

## 2021-06-29 DIAGNOSIS — Z7901 Long term (current) use of anticoagulants: Secondary | ICD-10-CM

## 2021-06-29 DIAGNOSIS — F0390 Unspecified dementia without behavioral disturbance: Secondary | ICD-10-CM | POA: Diagnosis present

## 2021-06-29 DIAGNOSIS — R1310 Dysphagia, unspecified: Secondary | ICD-10-CM | POA: Diagnosis present

## 2021-06-29 DIAGNOSIS — T82528A Displacement of other cardiac and vascular devices and implants, initial encounter: Secondary | ICD-10-CM

## 2021-06-29 DIAGNOSIS — R0902 Hypoxemia: Secondary | ICD-10-CM

## 2021-06-29 DIAGNOSIS — R579 Shock, unspecified: Secondary | ICD-10-CM | POA: Diagnosis not present

## 2021-06-29 DIAGNOSIS — Z981 Arthrodesis status: Secondary | ICD-10-CM | POA: Diagnosis not present

## 2021-06-29 DIAGNOSIS — R6889 Other general symptoms and signs: Secondary | ICD-10-CM | POA: Diagnosis not present

## 2021-06-29 DIAGNOSIS — Z993 Dependence on wheelchair: Secondary | ICD-10-CM

## 2021-06-29 DIAGNOSIS — J9811 Atelectasis: Secondary | ICD-10-CM | POA: Diagnosis not present

## 2021-06-29 DIAGNOSIS — Z7189 Other specified counseling: Secondary | ICD-10-CM | POA: Diagnosis not present

## 2021-06-29 DIAGNOSIS — E871 Hypo-osmolality and hyponatremia: Secondary | ICD-10-CM | POA: Diagnosis not present

## 2021-06-29 DIAGNOSIS — Z8542 Personal history of malignant neoplasm of other parts of uterus: Secondary | ICD-10-CM

## 2021-06-29 DIAGNOSIS — Z4682 Encounter for fitting and adjustment of non-vascular catheter: Secondary | ICD-10-CM | POA: Diagnosis not present

## 2021-06-29 DIAGNOSIS — R918 Other nonspecific abnormal finding of lung field: Secondary | ICD-10-CM | POA: Diagnosis not present

## 2021-06-29 DIAGNOSIS — Z0189 Encounter for other specified special examinations: Secondary | ICD-10-CM

## 2021-06-29 DIAGNOSIS — Z7951 Long term (current) use of inhaled steroids: Secondary | ICD-10-CM

## 2021-06-29 DIAGNOSIS — R778 Other specified abnormalities of plasma proteins: Secondary | ICD-10-CM

## 2021-06-29 DIAGNOSIS — F419 Anxiety disorder, unspecified: Secondary | ICD-10-CM | POA: Diagnosis present

## 2021-06-29 LAB — POCT I-STAT 7, (LYTES, BLD GAS, ICA,H+H)
Acid-base deficit: 2 mmol/L (ref 0.0–2.0)
Bicarbonate: 21.3 mmol/L (ref 20.0–28.0)
Calcium, Ion: 1.05 mmol/L — ABNORMAL LOW (ref 1.15–1.40)
HCT: 35 % — ABNORMAL LOW (ref 36.0–46.0)
Hemoglobin: 11.9 g/dL — ABNORMAL LOW (ref 12.0–15.0)
O2 Saturation: 99 %
Patient temperature: 35.1
Potassium: 4.6 mmol/L (ref 3.5–5.1)
Sodium: 136 mmol/L (ref 135–145)
TCO2: 22 mmol/L (ref 22–32)
pCO2 arterial: 27 mmHg — ABNORMAL LOW (ref 32.0–48.0)
pH, Arterial: 7.497 — ABNORMAL HIGH (ref 7.350–7.450)
pO2, Arterial: 115 mmHg — ABNORMAL HIGH (ref 83.0–108.0)

## 2021-06-29 LAB — COMPREHENSIVE METABOLIC PANEL
ALT: 11 U/L (ref 0–44)
AST: 17 U/L (ref 15–41)
Albumin: 2.3 g/dL — ABNORMAL LOW (ref 3.5–5.0)
Alkaline Phosphatase: 62 U/L (ref 38–126)
Anion gap: 16 — ABNORMAL HIGH (ref 5–15)
BUN: 45 mg/dL — ABNORMAL HIGH (ref 8–23)
CO2: 24 mmol/L (ref 22–32)
Calcium: 7.7 mg/dL — ABNORMAL LOW (ref 8.9–10.3)
Chloride: 99 mmol/L (ref 98–111)
Creatinine, Ser: 2.73 mg/dL — ABNORMAL HIGH (ref 0.44–1.00)
GFR, Estimated: 16 mL/min — ABNORMAL LOW (ref >60)
Glucose, Bld: 87 mg/dL (ref 70–99)
Potassium: 5.5 mmol/L — ABNORMAL HIGH (ref 3.5–5.1)
Sodium: 139 mmol/L (ref 135–145)
Total Bilirubin: 2.2 mg/dL — ABNORMAL HIGH (ref 0.3–1.2)
Total Protein: 5.5 g/dL — ABNORMAL LOW (ref 6.5–8.1)

## 2021-06-29 LAB — BASIC METABOLIC PANEL
Anion gap: 20 — ABNORMAL HIGH (ref 5–15)
BUN: 44 mg/dL — ABNORMAL HIGH (ref 8–23)
CO2: 20 mmol/L — ABNORMAL LOW (ref 22–32)
Calcium: 8.1 mg/dL — ABNORMAL LOW (ref 8.9–10.3)
Chloride: 97 mmol/L — ABNORMAL LOW (ref 98–111)
Creatinine, Ser: 2.91 mg/dL — ABNORMAL HIGH (ref 0.44–1.00)
GFR, Estimated: 15 mL/min — ABNORMAL LOW (ref >60)
Glucose, Bld: 102 mg/dL — ABNORMAL HIGH (ref 70–99)
Potassium: 4.7 mmol/L (ref 3.5–5.1)
Sodium: 137 mmol/L (ref 135–145)

## 2021-06-29 LAB — URINALYSIS, ROUTINE W REFLEX MICROSCOPIC
Bilirubin Urine: NEGATIVE
Glucose, UA: NEGATIVE mg/dL
Hgb urine dipstick: NEGATIVE
Ketones, ur: 5 mg/dL — AB
Nitrite: NEGATIVE
Protein, ur: NEGATIVE mg/dL
Specific Gravity, Urine: 1.013 (ref 1.005–1.030)
WBC, UA: >50 WBC/hpf — ABNORMAL HIGH (ref 0–5)
pH: 5 (ref 5.0–8.0)

## 2021-06-29 LAB — I-STAT CHEM 8, ED
BUN: 43 mg/dL — ABNORMAL HIGH (ref 8–23)
Calcium, Ion: 0.98 mmol/L — ABNORMAL LOW (ref 1.15–1.40)
Chloride: 99 mmol/L (ref 98–111)
Creatinine, Ser: 2.5 mg/dL — ABNORMAL HIGH (ref 0.44–1.00)
Glucose, Bld: 86 mg/dL (ref 70–99)
HCT: 31 % — ABNORMAL LOW (ref 36.0–46.0)
Hemoglobin: 10.5 g/dL — ABNORMAL LOW (ref 12.0–15.0)
Potassium: 5.4 mmol/L — ABNORMAL HIGH (ref 3.5–5.1)
Sodium: 137 mmol/L (ref 135–145)
TCO2: 28 mmol/L (ref 22–32)

## 2021-06-29 LAB — TSH: TSH: 4.424 u[IU]/mL (ref 0.350–4.500)

## 2021-06-29 LAB — CBC WITH DIFFERENTIAL/PLATELET
Abs Immature Granulocytes: 0.21 10*3/uL — ABNORMAL HIGH (ref 0.00–0.07)
Basophils Absolute: 0 10*3/uL (ref 0.0–0.1)
Basophils Relative: 0 %
Eosinophils Absolute: 0 10*3/uL (ref 0.0–0.5)
Eosinophils Relative: 0 %
HCT: 34.8 % — ABNORMAL LOW (ref 36.0–46.0)
Hemoglobin: 10.1 g/dL — ABNORMAL LOW (ref 12.0–15.0)
Immature Granulocytes: 2 %
Lymphocytes Relative: 27 %
Lymphs Abs: 2.9 10*3/uL (ref 0.7–4.0)
MCH: 32.3 pg (ref 26.0–34.0)
MCHC: 29 g/dL — ABNORMAL LOW (ref 30.0–36.0)
MCV: 111.2 fL — ABNORMAL HIGH (ref 80.0–100.0)
Monocytes Absolute: 0.5 10*3/uL (ref 0.1–1.0)
Monocytes Relative: 5 %
Neutro Abs: 7.1 10*3/uL (ref 1.7–7.7)
Neutrophils Relative %: 66 %
Platelets: 310 10*3/uL (ref 150–400)
RBC: 3.13 MIL/uL — ABNORMAL LOW (ref 3.87–5.11)
RDW: 16 % — ABNORMAL HIGH (ref 11.5–15.5)
WBC: 10.7 10*3/uL — ABNORMAL HIGH (ref 4.0–10.5)
nRBC: 0.5 % — ABNORMAL HIGH (ref 0.0–0.2)

## 2021-06-29 LAB — RESP PANEL BY RT-PCR (FLU A&B, COVID) ARPGX2
Influenza A by PCR: NEGATIVE
Influenza B by PCR: NEGATIVE
SARS Coronavirus 2 by RT PCR: NEGATIVE

## 2021-06-29 LAB — PROTIME-INR
INR: 8.7 (ref 0.8–1.2)
Prothrombin Time: 71.3 seconds — ABNORMAL HIGH (ref 11.4–15.2)

## 2021-06-29 LAB — LACTIC ACID, PLASMA
Lactic Acid, Venous: 1.9 mmol/L (ref 0.5–1.9)
Lactic Acid, Venous: 2 mmol/L (ref 0.5–1.9)

## 2021-06-29 LAB — GLUCOSE, CAPILLARY
Glucose-Capillary: 98 mg/dL (ref 70–99)
Glucose-Capillary: 98 mg/dL (ref 70–99)

## 2021-06-29 LAB — MRSA NEXT GEN BY PCR, NASAL: MRSA by PCR Next Gen: NOT DETECTED

## 2021-06-29 LAB — TROPONIN I (HIGH SENSITIVITY)
Troponin I (High Sensitivity): 130 ng/L (ref <18)
Troponin I (High Sensitivity): 324 ng/L (ref <18)

## 2021-06-29 MED ORDER — VANCOMYCIN HCL 2000 MG/400ML IV SOLN
2000.0000 mg | Freq: Once | INTRAVENOUS | Status: AC
  Administered 2021-06-29: 2000 mg via INTRAVENOUS
  Filled 2021-06-29: qty 400

## 2021-06-29 MED ORDER — SODIUM CHLORIDE 0.9% FLUSH: 10.0000 mL | INTRAVENOUS | Status: DC | PRN

## 2021-06-29 MED ORDER — PHENYLEPHRINE HCL (PRESSORS) 10 MG/ML IV SOLN
INTRAVENOUS | Status: AC | PRN
  Administered 2021-06-29 (×2): 80 ug

## 2021-06-29 MED ORDER — ETOMIDATE 2 MG/ML IV SOLN: 20.0000 mg | Freq: Once | INTRAVENOUS | Status: DC

## 2021-06-29 MED ORDER — LEVOTHYROXINE SODIUM 25 MCG PO TABS: 75.0000 ug | ORAL_TABLET | Freq: Every day | ORAL | Status: DC

## 2021-06-29 MED ORDER — FENTANYL BOLUS VIA INFUSION
25.0000 ug | INTRAVENOUS | Status: DC | PRN
  Administered 2021-06-30: 25 ug via INTRAVENOUS
  Filled 2021-06-29: qty 25

## 2021-06-29 MED ORDER — SODIUM CHLORIDE 0.9 % IV SOLN
250.0000 mL | INTRAVENOUS | Status: DC
  Administered 2021-06-29: 250 mL via INTRAVENOUS

## 2021-06-29 MED ORDER — SODIUM CHLORIDE 0.9 % IV BOLUS
1000.0000 mL | Freq: Once | INTRAVENOUS | Status: AC
  Administered 2021-06-29: 1000 mL via INTRAVENOUS

## 2021-06-29 MED ORDER — SODIUM CHLORIDE 0.9% FLUSH
10.0000 mL | Freq: Two times a day (BID) | INTRAVENOUS | Status: DC
  Administered 2021-06-29 – 2021-07-01 (×3): 10 mL

## 2021-06-29 MED ORDER — PROPOFOL BOLUS VIA INFUSION
20.0000 mg | Freq: Once | INTRAVENOUS | Status: AC
  Administered 2021-06-29: 20 mg via INTRAVENOUS
  Filled 2021-06-29: qty 20

## 2021-06-29 MED ORDER — FENTANYL CITRATE (PF) 100 MCG/2ML IJ SOLN
25.0000 ug | INTRAMUSCULAR | Status: DC | PRN
  Administered 2021-06-30: 50 ug via INTRAVENOUS

## 2021-06-29 MED ORDER — FENTANYL 2500MCG IN NS 250ML (10MCG/ML) PREMIX INFUSION
25.0000 ug/h | INTRAVENOUS | Status: DC
  Administered 2021-06-29: 150 ug/h via INTRAVENOUS
  Administered 2021-06-29: 50 ug/h via INTRAVENOUS
  Administered 2021-06-30: 100 ug/h via INTRAVENOUS
  Filled 2021-06-29 (×2): qty 250

## 2021-06-29 MED ORDER — ETOMIDATE 2 MG/ML IV SOLN
INTRAVENOUS | Status: AC | PRN
  Administered 2021-06-29 (×2): 10 mg via INTRAVENOUS

## 2021-06-29 MED ORDER — SUCCINYLCHOLINE CHLORIDE 20 MG/ML IJ SOLN
INTRAMUSCULAR | Status: AC | PRN
  Administered 2021-06-29: 100 mg via INTRAVENOUS

## 2021-06-29 MED ORDER — FENTANYL CITRATE (PF) 100 MCG/2ML IJ SOLN: 50.0000 ug | Freq: Once | INTRAMUSCULAR | Status: DC

## 2021-06-29 MED ORDER — PROPOFOL 1000 MG/100ML IV EMUL
0.0000 ug/kg/min | INTRAVENOUS | Status: DC
  Administered 2021-06-29: 30 ug/kg/min via INTRAVENOUS

## 2021-06-29 MED ORDER — SODIUM CHLORIDE 0.9 % IV SOLN
2.0000 g | Freq: Once | INTRAVENOUS | Status: AC
  Administered 2021-06-29: 2 g via INTRAVENOUS
  Filled 2021-06-29: qty 2

## 2021-06-29 MED ORDER — SODIUM CHLORIDE 0.9 % IV SOLN: 2.0000 g | INTRAVENOUS | Status: DC

## 2021-06-29 MED ORDER — CHLORHEXIDINE GLUCONATE CLOTH 2 % EX PADS
6.0000 | MEDICATED_PAD | Freq: Every day | CUTANEOUS | Status: DC
  Administered 2021-06-29 – 2021-07-02 (×4): 6 via TOPICAL

## 2021-06-29 MED ORDER — ORAL CARE MOUTH RINSE
15.0000 mL | OROMUCOSAL | Status: DC
  Administered 2021-06-29 – 2021-07-01 (×20): 15 mL via OROMUCOSAL

## 2021-06-29 MED ORDER — NOREPINEPHRINE 4 MG/250ML-% IV SOLN
INTRAVENOUS | Status: AC | PRN
  Administered 2021-06-29: 10 ug/kg/min via INTRAVENOUS

## 2021-06-29 MED ORDER — NOREPINEPHRINE 4 MG/250ML-% IV SOLN
5.0000 ug/min | INTRAVENOUS | Status: DC
  Administered 2021-06-29 (×2): 8 ug/min via INTRAVENOUS
  Administered 2021-06-30: 5 ug/min via INTRAVENOUS
  Administered 2021-06-30: 8 ug/min via INTRAVENOUS
  Administered 2021-06-30: 9 ug/min via INTRAVENOUS
  Administered 2021-07-01: 3 ug/min via INTRAVENOUS
  Filled 2021-06-29 (×5): qty 250

## 2021-06-29 MED ORDER — POLYETHYLENE GLYCOL 3350 17 G PO PACK: 17.0000 g | PACK | Freq: Every day | ORAL | Status: DC | PRN

## 2021-06-29 MED ORDER — LACTATED RINGERS IV BOLUS
1000.0000 mL | Freq: Once | INTRAVENOUS | Status: AC
  Administered 2021-06-29: 1000 mL via INTRAVENOUS

## 2021-06-29 MED ORDER — SODIUM CHLORIDE 0.9 % IV SOLN
250.0000 mL | INTRAVENOUS | Status: DC
  Administered 2021-06-30: 250 mL via INTRAVENOUS

## 2021-06-29 MED ORDER — VANCOMYCIN VARIABLE DOSE PER UNSTABLE RENAL FUNCTION (PHARMACIST DOSING): Status: DC

## 2021-06-29 MED ORDER — DOCUSATE SODIUM 50 MG/5ML PO LIQD: 100.0000 mg | Freq: Two times a day (BID) | ORAL | Status: DC | PRN

## 2021-06-29 MED ORDER — PANTOPRAZOLE SODIUM 40 MG IV SOLR
40.0000 mg | Freq: Every day | INTRAVENOUS | Status: DC
  Administered 2021-06-29 – 2021-07-01 (×3): 40 mg via INTRAVENOUS
  Filled 2021-06-29 (×3): qty 40

## 2021-06-29 MED ORDER — PROPOFOL 1000 MG/100ML IV EMUL
0.0000 ug/kg/min | INTRAVENOUS | Status: DC
  Administered 2021-06-29 (×3): 30 ug/kg/min via INTRAVENOUS
  Administered 2021-06-30: 15 ug/kg/min via INTRAVENOUS
  Administered 2021-06-30: 5 ug/kg/min via INTRAVENOUS
  Administered 2021-06-30 – 2021-07-01 (×2): 15 ug/kg/min via INTRAVENOUS
  Filled 2021-06-29 (×2): qty 100
  Filled 2021-06-29: qty 200
  Filled 2021-06-29 (×2): qty 100

## 2021-06-29 MED ORDER — SODIUM CHLORIDE 0.9 % IV BOLUS (SEPSIS): 2000.0000 mL | Freq: Once | INTRAVENOUS | Status: DC

## 2021-06-29 MED ORDER — NOREPINEPHRINE 4 MG/250ML-% IV SOLN
2.0000 ug/min | INTRAVENOUS | Status: DC
  Administered 2021-06-29: 10 ug/min via INTRAVENOUS

## 2021-06-29 MED ORDER — IPRATROPIUM-ALBUTEROL 0.5-2.5 (3) MG/3ML IN SOLN: 3.0000 mL | RESPIRATORY_TRACT | Status: DC | PRN

## 2021-06-29 MED ORDER — ROCURONIUM BROMIDE 50 MG/5ML IV SOLN: 100.0000 mg | Freq: Once | INTRAVENOUS | Status: DC

## 2021-06-29 MED ORDER — CHLORHEXIDINE GLUCONATE 0.12% ORAL RINSE (MEDLINE KIT)
15.0000 mL | Freq: Two times a day (BID) | OROMUCOSAL | Status: DC
  Administered 2021-06-29 – 2021-07-02 (×6): 15 mL via OROMUCOSAL

## 2021-06-29 MED ORDER — LACTATED RINGERS IV SOLN: INTRAVENOUS | Status: AC

## 2021-06-29 NOTE — Progress Notes (Signed)
Pharmacy Antibiotic Note  Crystal [name] is a 87 y.o. female admitted on 06/15/2021 with sepsis.  Pharmacy has been consulted for vancomycin and cefepime dosing.  Plan: Vancomycin '2000mg'$  x1 then vanc variable on MAR given elevated Cr Cefepime 2g IV q24h -Monitor renal function, clinical status, and antibiotic plan -Re-assess dosing for vancomycin when able  Weight: 95 kg (209 lb 7 oz)  Temp (24hrs), Avg:95.5 F (35.3 C), Min:93.9 F (34.4 C), Max:97 F (36.1 C)  Recent Labs  Lab 06/12/2021 1124 06/03/2021 1126 06/25/2021 1135  WBC  --  10.7*  --   CREATININE  --  2.73* 2.50*  LATICACIDVEN 1.9  --   --     Estimated Creatinine Clearance: 16.3 mL/min (A) (by C-G formula based on SCr of 2.5 mg/dL (H)).    Allergies  Allergen Reactions   Clinoril [Sulindac] Rash   Erythromycin Rash   Penicillins Rash   Prozac [Fluoxetine Hcl] Rash   Sulfa Antibiotics Rash    Antimicrobials this admission: Vanc 7/29 >>  Cefepime 7/29 >>   Microbiology results: 7/29 BCx:   Thank you for allowing pharmacy to be a part of this patient's care.  Joetta Manners, PharmD, [facility] Emergency Medicine Clinical Pharmacist ED RPh Phone: [place]: [phone]

## 2021-06-29 NOTE — Procedures (Signed)
Arterial Catheter Insertion Procedure Note  KAVITHA WEBBE  AM:[id number]  12/27/1924  Date:06/09/2021  Time:5:59 PM    Provider Performing: Estill Cotta    Procedure: Insertion of Arterial Line [phone]) with US guidance [id number])   Indication(s) Blood pressure monitoring and/or need for frequent ABGs  Consent Unable to obtain consent due to inability to find a medical decision maker for patient.  All reasonable efforts were made.  Another independent medical provider, DeWald MD , confirmed the benefits of this procedure outweigh the risks.  Anesthesia None   Time Out Verified patient identification, verified procedure, site/side was marked, verified correct patient position, special equipment/implants available, medications/allergies/relevant history reviewed, required imaging and test results available.   Sterile Technique Maximal sterile technique including full sterile barrier drape, hand hygiene, sterile gown, sterile gloves, mask, hair covering, sterile ultrasound probe cover (if used).   Procedure Description Area of catheter insertion was cleaned with chlorhexidine and draped in sterile fashion. With real-time ultrasound guidance an arterial catheter was placed into the right femoral artery.  Appropriate arterial tracings confirmed on monitor.     Complications/Tolerance None; patient tolerated the procedure well.   EBL Minimal   Specimen(s) None  Redmond School., MSN, APRN, AGACNP-BC [facility]  06/02/2021 , 6:00 PM  Please see [email or web address] for pager details  If no response, please call [phone] After hours, please call Elink at [phone]

## 2021-06-29 NOTE — H&P (Addendum)
NAME:  Crystal [name], MRN:  [id number], DOB:  02-May-1925, LOS: 0 ADMISSION DATE:  06/01/2021, CONSULTATION DATE:  7/29 REFERRING MD:  Dr. Tomi Bamberger EDP, CHIEF COMPLAINT:  Unresponsive   History of Present Illness:  Patient is encephalopathic and/or intubated. Therefore history has been obtained from chart review.    Crystal [name] is a 87 y.o. female who presented to the [facility] ED with a chief complaint of altered mental status.   The patient was recently admitted from July 17th after a fall, received treatment for a mid humeral diaphysis fracture and an elevated INR and was discharged on the 21st. Per notes it seems that she was transitioned off warfarin to eliquis. Reports per family the past few days she was not really eating or drinking much.   Per chart review and discussion over the phone with son, the patient was given 2.5 mg of oxycodone the evening and 5 mg of Ambien of 7/29 at 2300. She slept all night and was found unresponsive this morning. EMS was called and she was found to be hypoxic and minimally responsive to painful stimuli. Narcan was given without relief.   The patient arrived to the ED and was found to be hypoxic and hypotensive.  Push dose pressors were given and she was intubated for airway protection. A code sepsis was called- 2 L of NS were given, pressors initiated, and  vanc and cefepime were started. 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 rate (!) 22, weight 95 kg, SpO2 100 %.    Vent Mode: PRVC FiO2 (%):  [100 %] 100 % Set Rate:  [22 bmp] 22 bmp Vt Set:  [470 mL] 470 mL PEEP:  [8 cmH20] 8 cmH20 Plateau Pressure:  [25 cmH20] 25 cmH20  No intake or output data in the 24 hours ending 06/01/2021 1434 Filed Weights   06/03/2021 1135  Weight: 95 kg    Examination: General: overweight elderly female in NAD on vent HENT: [place]/AT, PERRL, no JVD Lungs: Clear bilateral breath sounds Cardiovascular: RRR, no MRG Abdomen: Soft, non-distended, normoactive.  Extremities: No acute deformity Neuro: Sedated RASS -3.   CT head non-acute CXR: poor quality image. Volume loss on the right. ? R effusion. ? R basilar PNA vs edema.   K 5.5 Creatinine 2.73 LA 1.9 INR 8.7 WBC 10.7  Resolved Hospital Problem list     Assessment & Plan:   Acute hypoxemic respiratory failure: question aspiration event. Small to moderate  - Full vent support - Await ABG - VAP bundle - follow CXR - Will need HCAP coverage considering recent hospitalization. Cefepime/Vanco  Shock: Distributive vs hypovolemic.  - Admit to ICU - s/p 2L NS in the ED - Will gently give an additional liter of LR.  - Norepinephrine for map goal 65 mmHg -  Echocardiogram  Acute renal failure: most likely pre-renal. Baseline creatinine 1, but 2.7 on admission  Hyperkalemia  - Gentle hydration - BP support with pressors - Repeat BMP this evening  Acute metabolic encephalopathy: multifactorial. Likely dehydrated at home and still taking Ambien and narcs. I suspect this is the initial insult. Now she has likely aspirated and hypoxemia/shock playing a role.  - Sedation with propofol> wean as able. RASS goal -1.  - Support BP - Hydrate and give time for long acting sedatives to wear off.   Hx DVT/PE with lifelong AC. Switched to Eliquis  during previous hospitalization as she has been unable to keep up with INR checks on warfarin. - Hold Eliquis with INR 8 and renal failure - Not clear how significant supratherapeutic INR is, as she is on Eliquis not warfarin.  - Recent falls question her anticoagulation candidacy  COPD without acute exacerbation no bronchodilators on home med list - PRN duoneb  HTN Long QTC - holding home benazepril, lasix,   Hypothyroid - TSH - Continue home dose synthroid  Goals of care: - Long discussion over phone with the patient's son Jenny Reichmann. He describes his mothers health recently as declining leading up to her more recent hospitalization. She is wheelchair bound, requires significant assistance with ADLs, and has began to demonstrate some mental decline as well. He wants [place] to aggressively treat medical conditions, but would not want her to undergo CPR if her heart were to stop. Ok with continued mechanical ventilation.     Best Practice (right click and "Reselect all SmartList Selections" daily)   Diet/type: NPO DVT prophylaxis: not indicated GI prophylaxis: PPI Lines: Central line Foley:  Yes, and it is still needed Code Status:  DNR Last date of multidisciplinary goals of care discussion [ 7/29 ]  Labs   CBC: Recent Labs  Lab 06/05/2021 1126 06/03/2021 1135  WBC 10.7*  --   NEUTROABS 7.1  --   HGB 10.1* 10.5*  HCT 34.8* 31.0*  MCV 111.2*  --   PLT 310  --     Basic Metabolic Panel: Recent Labs  Lab 06/08/2021 1126 06/13/2021 1135  NA 139 137  K 5.5* 5.4*  CL 99 99  CO2 24  --   GLUCOSE 87 86  BUN 45* 43*  CREATININE 2.73* 2.50*  CALCIUM 7.7*  --    GFR: Estimated Creatinine Clearance: 16.3 mL/min (A) (by C-G formula based on SCr of 2.5 mg/dL (H)). Recent Labs  Lab 07/01/2021 1124 06/27/2021 1126  WBC  --  10.7*  LATICACIDVEN 1.9  --     Liver Function Tests: Recent Labs  Lab 06/06/2021 1126  AST 17  ALT 11  ALKPHOS 62  BILITOT 2.2*  PROT 5.5*  ALBUMIN 2.3*    No results for input(s): LIPASE, AMYLASE in the last 168 hours. No results for input(s): AMMONIA in the last 168 hours.  ABG    Component Value Date/Time   TCO2 28 06/01/2021 1135     Coagulation Profile: Recent Labs  Lab 06/05/2021 1126  INR 8.7*    Cardiac Enzymes: No results for input(s): CKTOTAL, CKMB, CKMBINDEX, TROPONINI in the last 168 hours.  HbA1C: No results found for: HGBA1C  CBG: No results for input(s): GLUCAP in the last 168 hours.  Review of Systems:   Patient is encephalopathic and/or intubated. Therefore history has been obtained from chart review.    Past Medical History:  She,  has a past medical history of Allergic sinusitis, Arthritis, Asthma,  Benign tumor of pituitary gland ([place]) (1999), Cancer ([place]), Class 1 obesity due to excess calories with body mass index (BMI) of 33.0 to 33.9 in adult (06/17/2021), COPD (chronic obstructive pulmonary disease) ([place]), Diverticulosis, GERD (gastroesophageal reflux disease), Hypertension, Hyperthyroidism, Hypothyroidism (acquired), IBS (irritable bowel syndrome), IBS (irritable bowel syndrome), Peripheral neuropathy, Polio, Polio, Restless leg syndrome, Shingles, Uterine cancer ([place]), Uterine cancer ([place]), and VTE (venous thromboembolism).   Surgical History:   Past Surgical History:  Procedure Laterality Date   CARPAL TUNNEL RELEASE Left    CATARACT EXTRACTION Right    CERVICAL POLYPECTOMY     ESOPHAGOGASTRODUODENOSCOPY N/A 01/22/2014   Procedure: ESOPHAGOGASTRODUODENOSCOPY (EGD);  Surgeon: Wonda Horner, MD;  [facility] ENDOSCOPY;  Service: Endoscopy;  Laterality: N/A;   REPLACEMENT TOTAL KNEE     TONSILLECTOMY     TOTAL SHOULDER REPLACEMENT     VENA CAVA FILTER PLACEMENT Right 01/24/2014   Procedure: INSERTION VENA-CAVA FILTER; ULTRASOUND GUIDED;  Surgeon: Rosetta Posner, MD;  Location: [facility];  Service: Vascular;  Laterality: Right;     Social History:   reports that she has quit smoking. Her smoking use  included cigarettes. She has never used smokeless tobacco. She reports previous alcohol use. She reports that she does not use drugs.   Family History:  Her family history includes Breast cancer in her mother; Cancer in her mother; Heart disease in her father.   Allergies Allergies  Allergen Reactions   Clinoril [Sulindac] Rash   Erythromycin Rash   Penicillins Rash   Prozac [Fluoxetine Hcl] Rash   Sulfa Antibiotics Rash     Home Medications  Prior to Admission medications   Medication Sig Start Date End Date Taking? Authorizing Provider  acetaminophen (TYLENOL) 325 MG tablet Take 2 tablets (650 mg total) by mouth every 4 (four) hours as needed for mild pain (or Fever >/= 101). 06/20/21   Dwyane Dee, MD  apixaban (ELIQUIS) 5 MG TABS tablet Take 1 tablet (5 mg total) by mouth 2 (two) times daily. 06/21/21   Dwyane Dee, MD  benazepril (LOTENSIN) 10 MG tablet Take 10 mg by mouth daily. 05/04/17   [provider]  benazepril (LOTENSIN) 10 MG tablet Take 10 mg by mouth daily. 04/26/21   [provider]  Cholecalciferol (VITAMIN D) 2000 units tablet Take 2,000 Units by mouth daily.     [provider]  docusate sodium 100 MG CAPS Take 100 mg by mouth 2 (two) times daily. 02/01/14   Domenic Polite, MD  ferrous sulfate 325 (65 FE) MG tablet Take 325 mg by mouth daily with breakfast.    [provider]  FLOVENT HFA 110 MCG/ACT inhaler Inhale 2 puffs into the lungs 2 (two) times daily as needed for wheezing. 05/24/17   [provider]  furosemide (LASIX) 20 MG tablet Take 20 mg by mouth daily. 06/05/17   [provider]  furosemide (LASIX) 20 MG tablet Take 40 mg by mouth daily. 04/26/21   [provider]  gabapentin (NEURONTIN) 400 MG capsule Take 400 mg by mouth 3 (three) times daily. 06/05/17   [provider]  gabapentin (NEURONTIN) 400 MG capsule Take 400 mg by mouth in the morning, at noon, and at bedtime. 04/24/21    [provider]  latanoprost (XALATAN) 0.005 % ophthalmic solution Place 1 drop into both eyes at bedtime. 06/17/17   [provider]  latanoprost (XALATAN) 0.005 % ophthalmic solution Place 1 drop into both eyes at bedtime. 04/27/21   [provider]  levothyroxine (SYNTHROID) 75 MCG tablet Take 75 mcg by mouth daily. 04/26/21   [provider]  levothyroxine (SYNTHROID, LEVOTHROID) 75 MCG tablet Take 75 mcg by mouth daily before breakfast.     [provider]  LORazepam (ATIVAN) 0.5 MG tablet Take 0.5 mg by mouth at bedtime as needed for sleep. 06/09/17   [provider]  LORazepam (ATIVAN) 0.5 MG tablet Take 0.5 mg by mouth at bedtime as needed for sleep. 04/26/21   [provider]  mirtazapine (REMERON) 30 MG tablet Take 30 mg by mouth at bedtime.     [provider]  mirtazapine (REMERON) 30 MG tablet Take 30 mg by mouth at bedtime. 04/26/21   [provider]  nitrofurantoin, macrocrystal-monohydrate, (MACROBID) 100 MG capsule Take 100 mg by mouth 2 (two) times daily. 03/03/18   [provider]  Omega-3 Fatty Acids (FISH OIL) 1000 MG CAPS Take 1,000 mg by mouth daily.     [provider]  omeprazole (PRILOSEC) 20 MG capsule Take 20 mg by mouth daily. 05/19/17   [provider]  omeprazole (PRILOSEC) 20 MG capsule Take 20 mg by mouth daily. 04/24/21   [provider]  oxyCODONE (ROXICODONE) 5 MG immediate release tablet Take 1 tablet (5 mg total) by mouth every 6 (six) hours as needed. 06/20/21 06/20/22  Dwyane Dee, MD  pramipexole (MIRAPEX) 0.75 MG tablet Take 0.75 mg by mouth at bedtime.    [provider]  pramipexole (MIRAPEX) 0.75 MG tablet Take 0.75 mg by mouth at bedtime. 04/24/21   [provider]  ranitidine (ZANTAC) 300 MG tablet Take 300 mg by mouth daily as needed for heartburn.    [provider]  traZODone (DESYREL) 50 MG tablet Take 50 mg by mouth  at bedtime as needed for sleep.    [provider]  Vitamin D, Ergocalciferol, (DRISDOL) 1.25 MG (50000 UNIT) CAPS capsule Take 50,000 Units by mouth every 14 (fourteen) days. 04/24/21   [provider]  warfarin (COUMADIN) 3 MG tablet Take 3 mg by mouth every other day. Alternate taking Coumadin 4 mg    [provider]  warfarin (COUMADIN) 4 MG tablet Take 1 tablet (4 mg total) by mouth daily. For INR 2-2.5 Patient taking differently: Take 4 mg by mouth every other day. Alternate taking with Coumadin 3 mg 02/01/14   Domenic Polite, MD  zolpidem (AMBIEN) 10 MG tablet Take 10 mg by mouth at bedtime. 05/27/17   [provider]  zolpidem (AMBIEN) 10 MG tablet Take 5 mg by mouth at bedtime. 04/24/21   [provider]     Critical care time: 52 minutes     Georgann Housekeeper, AGACNP-BC [facility]  See Amion for personal pager PCCM on call pager [phone] until 7pm. Please call Elink 7p-7a. [phone]  06/04/2021 2:34 PM

## 2021-06-29 NOTE — ED Triage Notes (Signed)
Patient BIB [facility] from home, minimally responsive to painful stimuli, low O2 sats with NRB. Husband states given 2.5 oxycodone last night at 11, slept all night and unresponsive this morning.

## 2021-06-29 NOTE — ED Provider Notes (Signed)
[facility] EMERGENCY DEPARTMENT Provider Note   CSN: [id number] Arrival date & time: 06/14/2021  1031     History Chief Complaint  Patient presents with   unresponsive    Crystal [name] is a 87 y.o. female.  HPI  Patient presented to the ED for altered mental status.  Patient was recently admitted to the hospital on July 17.  She was discharged on the 21st.  Patient was receiving treatment for a mid humeral diaphysis fracture of her right arm as well as elevated INR.  According to the EMS report the patient was home with her husband.  He gave her half dose of one of her pain medications last night.  Patient has been asleep ever since and then this morning he was unable to get her to wake up.  EMS was called.  They noted to be hypoxic and hypotensive.  She was given Narcan without relief.  Past Medical History:  Diagnosis Date   Allergic sinusitis    Arthritis    Asthma    Benign tumor of pituitary gland ([place]) 1999   Cancer ([place])    Class 1 obesity due to excess calories with body mass index (BMI) of 33.0 to 33.9 in adult 06/17/2021   COPD (chronic obstructive pulmonary disease) (HCC)    Diverticulosis    GERD (gastroesophageal reflux disease)    Hypertension    Hyperthyroidism    Hypothyroidism (acquired)    IBS (irritable bowel syndrome)    IBS (irritable bowel syndrome)    Peripheral neuropathy    Polio    Polio    residual RUE weakness   Restless leg syndrome    Shingles    Uterine cancer ([place])    Uterine cancer ([place])    remote   VTE (venous thromboembolism)    lifelong AC    Patient Active Problem List   Diagnosis Date Noted   Acute respiratory failure with hypoxia ([place]) 06/20/2021   Comminuted right humeral fracture 06/19/2021   Abnormal chest x-ray 06/19/2021   Elevated INR 06/18/2021   History of DVT (deep vein thrombosis) 06/17/2021   Polio 06/17/2021   Hypothyroidism 06/17/2021   Hypertension 06/17/2021   COPD (chronic obstructive  pulmonary disease) ([place]) 06/17/2021   Restless leg syndrome 06/17/2021   Fall at home, initial encounter 06/17/2021   Class 1 obesity due to excess calories with body mass index (BMI) of 33.0 to 33.9 in adult 06/17/2021   Prolonged QT interval 06/17/2021   Second degree heart block 09/25/2017   Dyspnea 08/13/2017   DVT (deep venous thrombosis) ([place]) 01/18/2014   Hypotension, unspecified 01/18/2014   PE (pulmonary embolism) 01/17/2014    Past Surgical History:  Procedure Laterality Date   CARPAL TUNNEL RELEASE Left    CATARACT EXTRACTION Right    CERVICAL POLYPECTOMY     ESOPHAGOGASTRODUODENOSCOPY N/A 01/22/2014   Procedure: ESOPHAGOGASTRODUODENOSCOPY (EGD);  Surgeon: Wonda Horner, MD;  Location[facility] ENDOSCOPY;  Service: Endoscopy;  Laterality: N/A;   REPLACEMENT TOTAL KNEE     TONSILLECTOMY     TOTAL SHOULDER REPLACEMENT     VENA CAVA FILTER PLACEMENT Right 01/24/2014   Procedure: INSERTION VENA-CAVA FILTER; ULTRASOUND GUIDED;  Surgeon: Rosetta Posner, MD;  Location: [facility];  Service: Vascular;  Laterality: Right;     OB History   No obstetric history on file.     Family History  Problem Relation Age of Onset   Cancer Mother    Breast cancer Mother  Heart disease Father     Social History   Tobacco Use   Smoking status: Former    Types: Cigarettes   Smokeless tobacco: Never  Vaping Use   Vaping Use: Never used  Substance Use Topics   Alcohol use: Not Currently   Drug use: Never    Home Medications Prior to Admission medications   Medication Sig Start Date End Date Taking? Authorizing Provider  acetaminophen (TYLENOL) 325 MG tablet Take 2 tablets (650 mg total) by mouth every 4 (four) hours as needed for mild pain (or Fever >/= 101). 06/20/21   Dwyane Dee, MD  apixaban (ELIQUIS) 5 MG TABS tablet Take 1 tablet (5 mg total) by mouth 2 (two) times daily. 06/21/21   Dwyane Dee, MD  benazepril (LOTENSIN) 10 MG tablet Take 10 mg by mouth daily. 05/04/17   [provider]  benazepril (LOTENSIN) 10 MG tablet Take 10 mg by mouth daily. 04/26/21   [provider]  Cholecalciferol (VITAMIN D) 2000 units tablet Take 2,000 Units by mouth daily.     [provider]  docusate sodium 100 MG CAPS Take 100 mg by mouth 2 (two) times daily. 02/01/14   Domenic Polite, MD  ferrous sulfate 325 (65 FE) MG tablet Take 325 mg by mouth daily with breakfast.    [provider]  FLOVENT HFA 110 MCG/ACT inhaler Inhale 2 puffs into the lungs 2 (two) times daily as needed for wheezing. 05/24/17   [provider]  furosemide (LASIX) 20 MG tablet Take 20 mg by mouth daily. 06/05/17   [provider]  furosemide (LASIX) 20 MG tablet Take 40 mg by mouth daily. 04/26/21   [provider]  gabapentin (NEURONTIN) 400 MG capsule Take 400 mg by mouth 3 (three) times daily. 06/05/17   [provider]  gabapentin (NEURONTIN) 400 MG capsule Take 400 mg by mouth in the morning, at noon, and at bedtime. 04/24/21   [provider]  latanoprost (XALATAN) 0.005 % ophthalmic solution Place 1 drop into both eyes at bedtime. 06/17/17   [provider]  latanoprost (XALATAN) 0.005 % ophthalmic solution Place 1 drop into both eyes at bedtime. 04/27/21   [provider]  levothyroxine (SYNTHROID) 75 MCG tablet Take 75 mcg by mouth daily. 04/26/21   [provider]  levothyroxine (SYNTHROID, LEVOTHROID) 75 MCG tablet Take 75 mcg by mouth daily before breakfast.     [provider]  LORazepam (ATIVAN) 0.5 MG tablet Take 0.5 mg by mouth at bedtime as needed for sleep. 06/09/17   [provider]  LORazepam (ATIVAN) 0.5 MG tablet Take 0.5 mg by mouth at bedtime as needed for sleep. 04/26/21   [provider]  mirtazapine (REMERON) 30 MG tablet Take 30 mg by mouth at bedtime.     [provider]  mirtazapine (REMERON) 30 MG tablet Take 30 mg by mouth at bedtime. 04/26/21   [provider]  nitrofurantoin, macrocrystal-monohydrate, (MACROBID) 100 MG capsule Take 100 mg by mouth 2 (two) times daily. 03/03/18   [provider]  Omega-3 Fatty Acids (FISH OIL) 1000 MG CAPS Take 1,000 mg by mouth daily.     [provider]  omeprazole (PRILOSEC) 20 MG capsule Take 20 mg by mouth daily. 05/19/17   [provider]  omeprazole (PRILOSEC) 20 MG capsule Take 20 mg by mouth daily. 04/24/21   [provider]  oxyCODONE (ROXICODONE) 5 MG immediate release tablet Take 1 tablet (5 mg total)  by mouth every 6 (six) hours as needed. 06/20/21 06/20/22  Dwyane Dee, MD  pramipexole (MIRAPEX) 0.75 MG tablet Take 0.75 mg by mouth at bedtime.    [provider]  pramipexole (MIRAPEX) 0.75 MG tablet Take 0.75 mg by mouth at bedtime. 04/24/21   [provider]  ranitidine (ZANTAC) 300 MG tablet Take 300 mg by mouth daily as needed for heartburn.    [provider]  traZODone (DESYREL) 50 MG tablet Take 50 mg by mouth at bedtime as needed for sleep.    [provider]  Vitamin D, Ergocalciferol, (DRISDOL) 1.25 MG (50000 UNIT) CAPS capsule Take 50,000 Units by mouth every 14 (fourteen) days. 04/24/21   [provider]  warfarin (COUMADIN) 3 MG tablet Take 3 mg by mouth every other day. Alternate taking Coumadin 4 mg    [provider]  warfarin (COUMADIN) 4 MG tablet Take 1 tablet (4 mg total) by mouth daily. For INR 2-2.5 Patient taking differently: Take 4 mg by mouth every other day. Alternate taking with Coumadin 3 mg 02/01/14   Domenic Polite, MD  zolpidem (AMBIEN) 10 MG tablet Take 10 mg by mouth at bedtime. 05/27/17   [provider]  zolpidem (AMBIEN) 10 MG tablet Take 5 mg by mouth at bedtime. 04/24/21   [provider]    Allergies    Clinoril [sulindac], Erythromycin, Penicillins, Prozac [fluoxetine hcl], and Sulfa antibiotics  Review of Systems   Review of Systems  Unable to  perform ROS: Mental status change   Physical Exam Updated Vital Signs BP (!) 121/55   Pulse 62   Temp (!) 95.4 F (35.2 C)   Resp (!) 22   Wt 95 kg   SpO2 100%   BMI 33.80 kg/m   Physical Exam Constitutional:      General: She is in acute distress.     Appearance: She is ill-appearing.  HENT:     Head: Normocephalic and atraumatic.     Nose: No congestion or rhinorrhea.  Eyes:     General:        Right eye: No discharge.        Left eye: No discharge.     Conjunctiva/sclera: Conjunctivae normal.  Cardiovascular:     Rate and Rhythm: Normal rate and regular rhythm.  Pulmonary:     Breath sounds: Rhonchi present. No wheezing.  Abdominal:     General: There is no distension.     Tenderness: There is no abdominal tenderness.  Musculoskeletal:     Cervical back: No rigidity.     Comments: Right arm in a brace, no edema elsewhere  Lymphadenopathy:     Cervical: No cervical adenopathy.  Skin:    Coloration: Skin is pale. Skin is not jaundiced.  Neurological:     Comments: Unresponsive verbally, does not answer questions, some withdrawal to pain    ED Results / Procedures / Treatments   Labs (all labs ordered are listed, but only abnormal results are displayed) Labs Reviewed  COMPREHENSIVE METABOLIC PANEL - Abnormal; Notable for the following components:      Result Value   Potassium 5.5 (*)    BUN 45 (*)    Creatinine, Ser 2.73 (*)    Calcium 7.7 (*)    Total Protein 5.5 (*)    Albumin 2.3 (*)    Total Bilirubin 2.2 (*)    GFR, Estimated 16 (*)    Anion gap 16 (*)    All other components within  normal limits  CBC WITH DIFFERENTIAL/PLATELET - Abnormal; Notable for the following components:   WBC 10.7 (*)    RBC 3.13 (*)    Hemoglobin 10.1 (*)    HCT 34.8 (*)    MCV 111.2 (*)    MCHC 29.0 (*)    RDW 16.0 (*)    nRBC 0.5 (*)    Abs Immature Granulocytes 0.21 (*)    All other components within normal limits  PROTIME-INR - Abnormal; Notable for the following  components:   Prothrombin Time 71.3 (*)    INR 8.7 (*)    All other components within normal limits  I-STAT CHEM 8, ED - Abnormal; Notable for the following components:   Potassium 5.4 (*)    BUN 43 (*)    Creatinine, Ser 2.50 (*)    Calcium, Ion 0.98 (*)    Hemoglobin 10.5 (*)    HCT 31.0 (*)    All other components within normal limits  TROPONIN I (HIGH SENSITIVITY) - Abnormal; Notable for the following components:   Troponin I (High Sensitivity) 130 (*)    All other components within normal limits  RESP PANEL BY RT-PCR (FLU A&B, COVID) ARPGX2  CULTURE, BLOOD (ROUTINE X 2)  CULTURE, BLOOD (ROUTINE X 2)  LACTIC ACID, PLASMA  LACTIC ACID, PLASMA  URINALYSIS, ROUTINE W REFLEX MICROSCOPIC  TROPONIN I (HIGH SENSITIVITY)    EKG None  Radiology CT Head Wo Contrast  Result Date: 06/27/2021 CLINICAL DATA:  Mental status change of unknown etiology EXAM: CT HEAD WITHOUT CONTRAST TECHNIQUE: Contiguous axial images were obtained from the base of the skull through the vertex without intravenous contrast. COMPARISON:  06/17/2021. FINDINGS: Brain: No evidence of acute infarction, hemorrhage, hydrocephalus, extra-axial collection or mass lesion/mass effect. There is mild diffuse low-attenuation within the subcortical and periventricular white matter compatible with chronic microvascular disease. Chronic bilateral basal ganglial lacunar infarcts. Left frontal lobe encephalomalacia is again noted compatible with previous infarct. Vascular: No hyperdense vessel or unexpected calcification. Skull: Normal. Negative for fracture or focal lesion. Sinuses/Orbits: Postoperative change involving the right maxillary sinus with chronic mucoperiosteal thickening noted. No acute abnormality identified. Other: None IMPRESSION: 1. No acute intracranial abnormalities. 2. Chronic small vessel ischemic disease and brain atrophy. 3. Chronic left frontal lobe infarct. Electronically Signed   By: Kerby Moors M.D.   On:  06/20/2021 12:44   DG Chest Port 1 View  Result Date: 07/01/2021 CLINICAL DATA:  ET tube, central line placement. Found unresponsive. EXAM: PORTABLE CHEST 1 VIEW COMPARISON:  06/17/2021. FINDINGS: Interval intubation with the endotracheal tube tip approximately 1.5 cm above the carina. Right IJ central venous catheter with the tip projecting at the expected location of the SVC. Gastric tube tip projects approximately 7.9 cm above the GE junction. Patient rotation and portable supine imaging limits evaluation. Within this limitation, there appears to be a new small to moderate right pleural effusion, cardiomegaly, and mild diffuse interstitial prominence. New overlying right basilar opacity. Previously seen reticulonodular opacity in the right midlung appears persistent with volume loss on the right. No visible pneumothorax on this single supine radiograph. Enlarged cardiac silhouette, similar to prior. Similar widening of the mediastinum, likely accentuated by AP technique and patient positioning. Suspected hiatal hernia, better characterized on prior CT. IMPRESSION: 1. Endotracheal tube tip approximately 1.5 cm above the carina. Consider retraction by approximately 2.5 cm. 2. Gastric tube tip projects approximately 7.9 cm above the GE junction. Recommend advancement if intragastric position is desired. 3. Right IJ central venous  catheter with the tip projecting at the expected location of the SVC. 4. Patient rotation and portable supine imaging limits evaluation. Within this limitation, there appears to be a new small to moderate right pleural effusion, cardiomegaly, and mild diffuse interstitial prominence suggestive of mild interstitial edema. New overlying right basilar opacity could represent atelectasis or pneumonia. 5. Previously seen reticulonodular opacity in the right midlung appears persistent with volume loss on the right. Given persistence, recommend short interval follow-up radiographs or CT chest  to exclude malignancy. 6. Similar widening of the mediastinum, likely accentuated by AP technique and patient positioning. Electronically Signed   By: Margaretha Sheffield MD   On: 06/19/2021 12:36    Procedures .Critical Care  Date/Time: 06/15/2021 11:58 AM Performed by: Dorie Rank, MD Authorized by: Dorie Rank, MD   Critical care provider statement:    Critical care time (minutes):  45   Critical care was time spent personally by me on the following activities:  Discussions with consultants, evaluation of patient's response to treatment, examination of patient, ordering and performing treatments and interventions, ordering and review of laboratory studies, ordering and review of radiographic studies, pulse oximetry, re-evaluation of patient's condition, obtaining history from patient or surrogate and review of old charts Procedure Name: Intubation Date/Time: 06/30/2021 11:59 AM Performed by: Dorie Rank, MD Pre-anesthesia Checklist: Patient identified, Patient being monitored, Emergency Drugs available, Timeout performed and Suction available Oxygen Delivery Method: Non-rebreather mask Preoxygenation: Pre-oxygenation with 100% oxygen Induction Type: Rapid sequence Ventilation: Mask ventilation without difficulty Laryngoscope Size: Glidescope Grade View: Grade I Tube size: 7.5 mm Number of attempts: 2 Airway Equipment and Method: Video-laryngoscopy Placement Confirmation: ETT inserted through vocal cords under direct vision, CO2 detector and Breath sounds checked- equal and bilateral Tube secured with: ETT holder Dental Injury: Teeth and Oropharynx as per pre-operative assessment  Difficulty Due To: Difficulty was unanticipated Comments: Difficulty due to infiltration of IV.  Initial RSI meds were not effective.  Once identified, meds administered through her other IV.  Intubated without difficulty    .Central Line  Date/Time: 06/12/2021 12:01 PM Performed by: Dorie Rank, MD Authorized  by: Dorie Rank, MD   Consent:    Consent obtained:  Emergent situation Pre-procedure details:    Indication(s): central venous access and insufficient peripheral access     Hand hygiene: Hand hygiene performed prior to insertion     Sterile barrier technique: All elements of maximal sterile technique followed     Skin preparation:  Chlorhexidine   Skin preparation agent: Skin preparation agent completely dried prior to procedure   Sedation:    Sedation type:  None Anesthesia:    Anesthesia method:  Local infiltration Procedure details:    Location:  R internal jugular   Patient position:  Supine   Procedural supplies:  Triple lumen   Ultrasound guidance: yes     Ultrasound guidance timing: real time     Sterile ultrasound techniques: Sterile gel and sterile probe covers were used     Number of attempts:  1   Successful placement: yes   Post-procedure details:    Post-procedure:  Dressing applied and line sutured   Assessment:  Blood return through all ports, free fluid flow and placement verified by x-ray   Procedure completion:  Tolerated   Medications Ordered in ED Medications  0.9 %  sodium chloride infusion (0 mLs Intravenous Stopped 06/28/2021 1110)  norepinephrine (LEVOPHED) '4mg'$  in 293m premix infusion (10 mcg/min Intravenous New Bag/Given 06/14/2021 1101)  fentaNYL 25089m in  NS 274m (176m/ml) infusion-PREMIX (50 mcg/hr Intravenous New Bag/Given 06/21/2021 1118)  fentaNYL (SUBLIMAZE) bolus via infusion 25 mcg (has no administration in time range)  propofol (DIPRIVAN) 1000 MG/100ML infusion (30 mcg/kg/min  95 kg Intravenous New Bag/Given 06/28/2021 1237)  vancomycin (VANCOREADY) IVPB 2000 mg/400 mL (2,000 mg Intravenous New Bag/Given 06/23/2021 1324)  lactated ringers infusion (has no administration in time range)  vancomycin variable dose per unstable renal function (pharmacist dosing) (has no administration in time range)  ceFEPIme (MAXIPIME) 2 g in sodium chloride 0.9 % 100 mL  IVPB (has no administration in time range)  etomidate (AMIDATE) injection (10 mg Intravenous Given 06/06/2021 1050)  phenylephrine (NEO-SYNEPHRINE) injection (80 mcg  Given 06/21/2021 1100)  succinylcholine (ANECTINE) injection (100 mg Intravenous Given 06/02/2021 1050)  norepinephrine (LEVOPHED) '4mg'$  in 250000000remix infusion (20 mcg/min Intravenous Rate/Dose Change 06/12/2021 1056)  sodium chloride 0.9 % bolus 1,000 mL (0 mLs Intravenous Stopped 06/18/2021 1241)  sodium chloride 0.9 % bolus 1,000 mL (0 mLs Intravenous Stopped 06/21/2021 1241)  propofol (DIPRIVAN) bolus via infusion 20 mg (20 mg Intravenous Bolus from Bag 06/21/2021 1237)  ceFEPIme (MAXIPIME) 2 g in sodium chloride 0.9 % 100 mL IVPB (0 g Intravenous Stopped 06/02/2021 1320)    ED Course  I have reviewed the triage vital signs and the nursing notes.  Pertinent labs & imaging results that were available during my care of the patient were reviewed by me and considered in my medical decision making (see chart for details).  Clinical Course as of 06/18/2021 1327  Fri Jun 29, 2021  1248 Trop elevated [JK]  1248 Cr elevated, aki [JK]  1249 Hgb stable [JK]  1324 Updated patient's son.  He states he does not want to come to the hospital but would like to receive updates.  He states she had not been doing well for few days now.  She has not been eating or drinking well [JK]    Clinical Course User Index [JK] KnDorie RankMD   MDM Rules/Calculators/A&P                           Patient presented to the ED in acute respiratory and cardiovascular distress.  She was hypoxic and hypotensive.  Immediately had to proceed with resuscitation.  Patient was given IV fluids.  We also administered push dose pressors.  Patient was intubated for airway control and resuscitation .  Patient does show evidence of cardiac injury with elevated troponin but this may be related to disc demand ischemia.  Patient has evidence of acute kidney injury.  She has been given IV fluids.   Head CT does not show any acute changes at this time.  Still concerned about the possibility of occult stroke.  Sepsis a concern with her hypotension and hypothermia.  Patient has been started on broad-spectrum antibiotics.  She is requiring vasopressors but has stabilized.  Consulted with critical care service for admission further treatment  final Clinical Impression(s) / ED Diagnoses Final diagnoses:  Sepsis, due to unspecified organism, unspecified whether acute organ dysfunction present ([place] Acute respiratory failure with hypoxia ([facility] Elevated troponin  AKI (acute kidney injury) ([facility]    KnDorie RankMD 06/25/2021 1328

## 2021-06-29 NOTE — Sepsis Progress Note (Signed)
eLink is monitoring this code sepsis

## 2021-06-30 ENCOUNTER — Inpatient Hospital Stay (HOSPITAL_COMMUNITY): Payer: Medicare Other

## 2021-06-30 DIAGNOSIS — A419 Sepsis, unspecified organism: Secondary | ICD-10-CM | POA: Diagnosis not present

## 2021-06-30 DIAGNOSIS — R0603 Acute respiratory distress: Secondary | ICD-10-CM

## 2021-06-30 DIAGNOSIS — Z452 Encounter for adjustment and management of vascular access device: Secondary | ICD-10-CM | POA: Diagnosis not present

## 2021-06-30 LAB — BLOOD CULTURE ID PANEL (REFLEXED) - BCID2

## 2021-06-30 LAB — BASIC METABOLIC PANEL
Anion gap: 17 — ABNORMAL HIGH (ref 5–15)
BUN: 47 mg/dL — ABNORMAL HIGH (ref 8–23)
CO2: 21 mmol/L — ABNORMAL LOW (ref 22–32)
Calcium: 8.1 mg/dL — ABNORMAL LOW (ref 8.9–10.3)
Chloride: 97 mmol/L — ABNORMAL LOW (ref 98–111)
Creatinine, Ser: 2.71 mg/dL — ABNORMAL HIGH (ref 0.44–1.00)
GFR, Estimated: 16 mL/min — ABNORMAL LOW (ref >60)
Glucose, Bld: 107 mg/dL — ABNORMAL HIGH (ref 70–99)
Potassium: 4.6 mmol/L (ref 3.5–5.1)
Sodium: 135 mmol/L (ref 135–145)

## 2021-06-30 LAB — GLUCOSE, CAPILLARY
Glucose-Capillary: 105 mg/dL — ABNORMAL HIGH (ref 70–99)
Glucose-Capillary: 102 mg/dL — ABNORMAL HIGH (ref 70–99)
Glucose-Capillary: 92 mg/dL (ref 70–99)
Glucose-Capillary: 92 mg/dL (ref 70–99)
Glucose-Capillary: 92 mg/dL (ref 70–99)
Glucose-Capillary: 95 mg/dL (ref 70–99)

## 2021-06-30 LAB — CBC
HCT: 34.4 % — ABNORMAL LOW (ref 36.0–46.0)
Hemoglobin: 10.8 g/dL — ABNORMAL LOW (ref 12.0–15.0)
MCH: 31.8 pg (ref 26.0–34.0)
MCHC: 31.4 g/dL (ref 30.0–36.0)
MCV: 101.2 fL — ABNORMAL HIGH (ref 80.0–100.0)
Platelets: 376 10*3/uL (ref 150–400)
RBC: 3.4 MIL/uL — ABNORMAL LOW (ref 3.87–5.11)
RDW: 16.2 % — ABNORMAL HIGH (ref 11.5–15.5)
WBC: 22.8 10*3/uL — ABNORMAL HIGH (ref 4.0–10.5)
nRBC: 0.2 % (ref 0.0–0.2)

## 2021-06-30 LAB — PROTIME-INR
INR: 9.2 (ref 0.8–1.2)
Prothrombin Time: 74.4 s — ABNORMAL HIGH (ref 11.4–15.2)

## 2021-06-30 LAB — MAGNESIUM: Magnesium: 2.1 mg/dL (ref 1.7–2.4)

## 2021-06-30 LAB — TRIGLYCERIDES: Triglycerides: 193 mg/dL — ABNORMAL HIGH (ref <150)

## 2021-06-30 LAB — PHOSPHORUS: Phosphorus: 2.7 mg/dL (ref 2.5–4.6)

## 2021-06-30 MED ORDER — SODIUM CHLORIDE 0.9 % IV SOLN
2.0000 g | INTRAVENOUS | Status: DC
  Administered 2021-06-30 – 2021-07-01 (×2): 2 g via INTRAVENOUS
  Filled 2021-06-30 (×2): qty 20

## 2021-06-30 MED ORDER — GABAPENTIN 250 MG/5ML PO SOLN
200.0000 mg | Freq: Three times a day (TID) | ORAL | Status: DC
  Filled 2021-06-30 (×7): qty 4

## 2021-06-30 MED ORDER — FENTANYL CITRATE (PF) 100 MCG/2ML IJ SOLN
25.0000 ug | INTRAMUSCULAR | Status: DC | PRN
  Administered 2021-07-01: 50 ug via INTRAVENOUS
  Administered 2021-07-01: 100 ug via INTRAVENOUS
  Filled 2021-06-30 (×2): qty 2

## 2021-06-30 NOTE — Progress Notes (Signed)
   06/30/21 0813  Airway 7.5 mm  Placement Date/Time: 06/26/2021 1052   Placed By: ED Physician  Airway Device: Endotracheal Tube  Laryngoscope Blade: MAC;3  ETT Types: Oral  Size (mm): 7.5 mm  Cuffed: Cuffed  Insertion attempts: 2  Airway Equipment: Stylet;Video Laryngoscope  Placemen...  Secured at (cm) 23 cm  Measured From Lips  Secured Location Right  Secured By Actuary Repositioned Yes  Cuff Pressure (cm H2O) Green OR 18-26 CmH2O  Site Condition Dry  Adult Ventilator Settings  Vent Type Servo i  Humidity HME  Vent Mode PRVC  Vt Set 470 mL  Set Rate 18 bmp  FiO2 (%) 40 %  I Time 1 Sec(s)  PEEP 5 cmH20  Adult Ventilator Measurements  Peak Airway Pressure 30 L/min  Mean Airway Pressure 15 cmH20  Resp Rate Spontaneous 0 br/min  Resp Rate Total 18 br/min  Exhaled Vt 470 mL  Measured Ve 8.5 mL  I:E Ratio Measured 1:1  Auto PEEP 0 cmH20  Total PEEP 5 cmH20  SpO2 95 %  Adult Ventilator Alarms  Alarms On Y  Ve High Alarm 17 L/min  Ve Low Alarm 6 L/min  Resp Rate High Alarm 38 br/min  Resp Rate Low Alarm 16  PEEP Low Alarm 6 cmH2O  Press High Alarm 45 cmH2O  Daily Weaning Assessment  Daily Assessment of Readiness to Wean Wean protocol criteria not met  Reason not met Apnea  Breath Sounds  Bilateral Breath Sounds Diminished  Airway Suctioning/Secretions  Suction Type ETT  Suction Device  Catheter  Secretion Amount None  Suction Tolerance Tolerated well  Suctioning Adverse Effects None

## 2021-06-30 NOTE — Progress Notes (Signed)
  Echocardiogram 2D Echocardiogram has been performed.  Johny Chess 06/30/2021, 5:54 PM

## 2021-06-30 NOTE — Progress Notes (Signed)
PHARMACY - PHYSICIAN COMMUNICATION CRITICAL VALUE ALERT - BLOOD CULTURE IDENTIFICATION (BCID)  Crystal [name] is an 87 y.o. female who presented to [facility] on 06/12/2021 with a chief complaint of altered mental status  Assessment:  2 out of 4 blood cultures with GPCs identified as strep species - unknown source  Name of physician (or Provider) Contacted: [place]  Current antibiotics: Vanc/ Cefepime  Changes to prescribed antibiotics recommended:  D/c Vanc and cefepime; start ceftriaxone  Results for orders placed or performed during the hospital encounter of 06/12/2021  Blood Culture ID Panel (Reflexed) (Collected: 06/12/2021 11:24 AM)  Result Value Ref Range   Enterococcus faecalis NOT DETECTED NOT DETECTED   Enterococcus Faecium NOT DETECTED NOT DETECTED   Listeria monocytogenes NOT DETECTED NOT DETECTED   Staphylococcus species NOT DETECTED NOT DETECTED   Staphylococcus aureus (BCID) NOT DETECTED NOT DETECTED   Staphylococcus epidermidis NOT DETECTED NOT DETECTED   Staphylococcus lugdunensis NOT DETECTED NOT DETECTED   Streptococcus species DETECTED (A) NOT DETECTED   Streptococcus agalactiae NOT DETECTED NOT DETECTED   Streptococcus pneumoniae NOT DETECTED NOT DETECTED   Streptococcus pyogenes NOT DETECTED NOT DETECTED   A.calcoaceticus-baumannii NOT DETECTED NOT DETECTED   Bacteroides fragilis NOT DETECTED NOT DETECTED   Enterobacterales NOT DETECTED NOT DETECTED   Enterobacter cloacae complex NOT DETECTED NOT DETECTED   Escherichia coli NOT DETECTED NOT DETECTED   Klebsiella aerogenes NOT DETECTED NOT DETECTED   Klebsiella oxytoca NOT DETECTED NOT DETECTED   Klebsiella pneumoniae NOT DETECTED NOT DETECTED   Proteus species NOT DETECTED NOT DETECTED   Salmonella species NOT DETECTED NOT DETECTED   Serratia marcescens NOT DETECTED NOT DETECTED   Haemophilus influenzae NOT DETECTED NOT DETECTED   Neisseria meningitidis NOT DETECTED NOT DETECTED   Pseudomonas aeruginosa  NOT DETECTED NOT DETECTED   Stenotrophomonas maltophilia NOT DETECTED NOT DETECTED   Candida albicans NOT DETECTED NOT DETECTED   Candida auris NOT DETECTED NOT DETECTED   Candida glabrata NOT DETECTED NOT DETECTED   Candida krusei NOT DETECTED NOT DETECTED   Candida parapsilosis NOT DETECTED NOT DETECTED   Candida tropicalis NOT DETECTED NOT DETECTED   Cryptococcus neoformans/gattii NOT DETECTED NOT DETECTED   Alanda Slim, PharmD, FCCM Clinical Pharmacist Please see AMION for all Pharmacists' Contact Phone Numbers 06/30/2021, 10:33 AM

## 2021-06-30 NOTE — Progress Notes (Addendum)
NAME:  Crystal [name], MRN:  [id number], DOB:  [birth year], LOS: 1 ADMISSION DATE:  06/18/2021, CONSULTATION DATE:  7/29 REFERRING MD:  Dr. Tomi Bamberger EDP, CHIEF COMPLAINT:  Unresponsive   History of Present Illness:  Patient is encephalopathic and/or intubated. Therefore history has been obtained from chart review.    Crystal [name] is a 87 y.o. female who presented to the [facility] ED with a chief complaint of altered mental status.   The patient was recently admitted from July 17th after a fall, received treatment for a mid humeral diaphysis fracture and an elevated INR and was discharged on the 21st. Per notes it seems that she was transitioned off warfarin to eliquis. Reports per family the past few days she was not really eating or drinking much.   Per chart review and discussion over the phone with son, the patient was given 2.5 mg of oxycodone the evening and 5 mg of Ambien of 7/29 at 2300. She slept all night and was found unresponsive this morning. EMS was called and she was found to be hypoxic and minimally responsive to painful stimuli. Narcan was given without relief.   The patient arrived to the ED and was found to be hypoxic and hypotensive.  Push dose pressors were given and she was intubated for airway protection. A code sepsis was called- 2 L of NS were given, pressors initiated, and  vanc and cefepime were started. 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 Intubated. Right IJ CVL placed. Rigth fem aline placed. Started on vanc and cefepime INR was > 8 so AC held  7/30 cultures growing GPC in blood. Dc vanc as BCID growing strep changing to rocephin . Awaiting INR   Interim History / Subjective:   Weaning pressors  Objective   Blood pressure (Abnormal) 119/53, pulse (Abnormal) 51, temperature (Abnormal) 97.16 F (36.2 C), resp. rate 18, height '5\' 6"'$  (1.676 m), weight 94.3 kg, SpO2 95 %.    Vent Mode: PRVC FiO2 (%):  [40 %-100 %] 40 % Set Rate:  [18 bmp-22 bmp] 18 bmp Vt Set:  [470 mL] 470 mL PEEP:  [5 cmH20-10 cmH20] 5 cmH20 Plateau Pressure:  [18 cmH20-24 cmH20] 23 cmH20   Intake/Output Summary (Last 24 hours) at 06/30/2021 1111 Last data filed at 06/30/2021 0700 Gross per 24 hour  Intake 3965.76 ml  Output 180 ml  Net 3785.76 ml   Filed Weights   06/11/2021 1135 06/02/2021 1600 06/30/21 0500  Weight: 95 kg 93 kg 94.3 kg    Examination: General: Chronically ill-appearing 87 year old female remains sedated on fentanyl infusion.  When sedation was off was biting on endotracheal tube preventing weaning attempt HEENT: Orally intubated neck advance it is a little bit looks like the stenosis at the past Tubes currently grinding the back of the posterior pharynx to yesterday edema reduced the problem is moving a lot think he is having right JVD pupils equal reactive  Pulmonary: Crackles bases, equal breath sounds, plateau pressures 20 Cardiac: Regular rate and rhythm Abdomen: Soft nontender Extremities: Warm dry brisk capillary refill, pitting edema in all extremities skin discolored, thin, weeping Neuro: Agitated at times will move equally, purposeful. GU clear yellow.  Resolved Hospital Problem list     Assessment & Plan:   Acute hypoxemic respiratory failure: Secondary to aspiration pneumonia/HCAP, and probably element of left pleural effusion PCXR: Endotracheal tube in satisfactory position, left greater than right  airspace disease.  Plan Continue full ventilator support VAP bundle PAD protocol, RASS goal 0 to -1 A.m. chest x-ray Will look at left chest with ultrasound, 2 out of 4 blood cultures positive with GPC   Septic shock in the setting of GPC bacteremia-->? Source aspiration, GI translocation?  -Weaning pressors -PCR  Plan Continue IV maintenance fluids Titrate norepi for MAP > 65 F/u ECHO  Narrow abx to ceftriaxone    Acute renal failure: most likely pre-renal. Baseline creatinine 1, but 2.7 on admission  & slowly improving  Plan Keep euvolemic Strict I&O Renal dose meds Am chem   Intermittent fluid and electrolyte imbalance Plan Trend and replace as indicated  Acute toxic and metabolic encephalopathy: sepsis, meds  Plan PAD protocol  RASS goal 0 to -1  Add back Neurontin at 1/2 dose   Hx DVT/PE w/ acute coagulopathy  with lifelong AC. Switched to Eliquis during previous hospitalization as she has been unable to keep up with INR checks on warfarin. - Hold Eliquis with INR 8 and renal failure - Not clear how significant supratherapeutic INR is, as she is on Eliquis not warfarin.  Plan Repeat INR today  Holding AC Cont SCD  COPD without acute exacerbation no bronchodilators on home med list Plan PRN BDs  HTN Long QTC Plan Holding home meds given shock  Hypothyroid Plan Cont synthroid    Goals of care: wheelchair bound, requires significant assistance with ADLs, and has began to demonstrate some mental decline as well. He wants [place] to aggressively treat medical conditions, but would not want her to undergo CPR if her heart were to stop. Ok with continued mechanical ventilation.      Best Practice (right click and "Reselect all SmartList Selections" daily)   Diet/type: tubefeeds DVT prophylaxis: not indicated GI prophylaxis: PPI Lines: Central line Foley:  Yes, and it is still needed Code Status:  DNR Last date of multidisciplinary goals of care  discussion [ 7/29 ]  Critical care time: 32 min     06/30/2021 11:11 AM    Attending:    Subjective: On mechanical ventilation Wakes to touch this morning Remains on low dose levophed INR elevated Family reports she may be taking two different anticoagulants at home [facility] bacteremia: strep species  Objective: Vitals:   06/30/21 1200 06/30/21 1206 06/30/21 1300 06/30/21 1400  BP: (!) 95/50  (!) 99/47 (!) 91/45  Pulse: (!) 58  64 (!) 58  Resp: '18  18 18  '$ Temp: 97.7 F (36.5 C)  98.06 F (36.7 C) 98.06 F (36.7 C)  TempSrc:      SpO2: 95% 92% 95% 95%  Weight:      Height:       Vent Mode: PRVC FiO2 (%):  [40 %-100 %] 40 % Set Rate:  [18 bmp-22 bmp] 18 bmp Vt Set:  [470 mL] 470 mL PEEP:  [5 cmH20-10 cmH20] 5 cmH20 Plateau Pressure:  [18 cmH20-24 cmH20] 22 cmH20  Intake/Output Summary (Last 24 hours) at 06/30/2021 1453 Last  data filed at 06/30/2021 1400 Gross per 24 hour  Intake 4450.59 ml  Output 300 ml  Net 4150.59 ml    General:  In bed on vent HENT: NCAT ETT in place PULM: CTA B, vent supported breathing CV: RRR, no mgr GI: BS+, soft, nontender MSK: normal bulk and tone Neuro: sedated on vent    CBC    Component Value Date/Time   WBC 22.8 (H) 06/30/2021 0218   RBC 3.40 (L) 06/30/2021 0218   HGB 10.8 (L) 06/30/2021 0218   HCT 34.4 (L) 06/30/2021 0218   PLT 376 06/30/2021 0218   MCV 101.2 (H) 06/30/2021 0218   MCH 31.8 06/30/2021 0218   MCHC 31.4 06/30/2021 0218   RDW 16.2 (H) 06/30/2021 0218   LYMPHSABS 2.9 06/04/2021 1126   MONOABS 0.5 06/09/2021 1126   EOSABS 0.0 06/21/2021 1126   BASOSABS 0.0 06/11/2021 1126    BMET    Component Value Date/Time   NA 135 06/30/2021 0218   K 4.6 06/30/2021 0218   CL 97 (L) 06/30/2021 0218   CO2 21 (L) 06/30/2021 0218   GLUCOSE 107 (H) 06/30/2021 0218   BUN 47 (H) 06/30/2021 0218   CREATININE 2.71 (H) 06/30/2021 0218   CALCIUM 8.1 (L) 06/30/2021 0218   GFRNONAA 16 (L) 06/30/2021 0218   GFRAA >60  03/08/2018 2131    CXR images personally reviewed, rotated, left lung infiltrates LLL, trace effusion, ett in place  Impression/Plan: Acute respiratory failure with hypoxemia Full mechanical vent support VAP prevention Daily WUA/SBT  Septic shock due to pneumonia and strep bacteremia Continue ceftriaxone Monitor culture result  Need for sedation for mechanical ventiation: over sedated 7/30 RASS target 0 to -1 Change fentanyl to prn Propofol per PAD  AKI: Treat septic shock as above  Hypothyroidism: Hold for now until we can place cor-trak  Rest as per NP note DNR  My cc time 20 minutes  Roselie Awkward, MD [place] [facility] Pager: [phone] Cell: [phone] After 7pm: [phone]

## 2021-06-30 NOTE — Progress Notes (Signed)
eLink Physician-Brief Progress Note Patient Name: Crystal [name] DOB: 15-Jan-1925 MRN: [id number]   Date of Service  06/30/2021  HPI/Events of Note  Received request for IV synthroid. Have been unable to insert feeding tube. TSH 4  eICU Interventions  Will defer IV synthroid for now as no urgent indication. Will await dayteam to attempt for GI access     Intervention Category Intermediate Interventions: Other:  Judd Lien 06/30/2021, 4:31 AM

## 2021-07-01 ENCOUNTER — Inpatient Hospital Stay (HOSPITAL_COMMUNITY): Payer: Medicare Other

## 2021-07-01 DIAGNOSIS — J9601 Acute respiratory failure with hypoxia: Secondary | ICD-10-CM | POA: Diagnosis not present

## 2021-07-01 DIAGNOSIS — N179 Acute kidney failure, unspecified: Secondary | ICD-10-CM | POA: Diagnosis not present

## 2021-07-01 DIAGNOSIS — A419 Sepsis, unspecified organism: Secondary | ICD-10-CM | POA: Diagnosis not present

## 2021-07-01 DIAGNOSIS — Z452 Encounter for adjustment and management of vascular access device: Secondary | ICD-10-CM | POA: Diagnosis not present

## 2021-07-01 LAB — PROTIME-INR
INR: 4.5 (ref 0.8–1.2)
Prothrombin Time: 42.5 seconds — ABNORMAL HIGH (ref 11.4–15.2)

## 2021-07-01 LAB — CBC
HCT: 31.9 % — ABNORMAL LOW (ref 36.0–46.0)
Hemoglobin: 10.4 g/dL — ABNORMAL LOW (ref 12.0–15.0)
MCH: 31.6 pg (ref 26.0–34.0)
MCHC: 32.6 g/dL (ref 30.0–36.0)
MCV: 97 fL (ref 80.0–100.0)
Platelets: 324 10*3/uL (ref 150–400)
RBC: 3.29 MIL/uL — ABNORMAL LOW (ref 3.87–5.11)
RDW: 16.9 % — ABNORMAL HIGH (ref 11.5–15.5)
WBC: 19.2 10*3/uL — ABNORMAL HIGH (ref 4.0–10.5)
nRBC: 0 % (ref 0.0–0.2)

## 2021-07-01 LAB — COMPREHENSIVE METABOLIC PANEL
ALT: 14 U/L (ref 0–44)
AST: 22 U/L (ref 15–41)
Albumin: 2 g/dL — ABNORMAL LOW (ref 3.5–5.0)
Alkaline Phosphatase: 66 U/L (ref 38–126)
Anion gap: 12 (ref 5–15)
BUN: 44 mg/dL — ABNORMAL HIGH (ref 8–23)
CO2: 24 mmol/L (ref 22–32)
Calcium: 7.9 mg/dL — ABNORMAL LOW (ref 8.9–10.3)
Chloride: 98 mmol/L (ref 98–111)
Creatinine, Ser: 2.32 mg/dL — ABNORMAL HIGH (ref 0.44–1.00)
GFR, Estimated: 19 mL/min — ABNORMAL LOW (ref >60)
Glucose, Bld: 104 mg/dL — ABNORMAL HIGH (ref 70–99)
Potassium: 3.9 mmol/L (ref 3.5–5.1)
Sodium: 134 mmol/L — ABNORMAL LOW (ref 135–145)
Total Bilirubin: 2.1 mg/dL — ABNORMAL HIGH (ref 0.3–1.2)
Total Protein: 5.3 g/dL — ABNORMAL LOW (ref 6.5–8.1)

## 2021-07-01 LAB — ECHOCARDIOGRAM COMPLETE
Area-P 1/2: 2.29 cm2
Height: 66 in
S' Lateral: 2.2 cm
Weight: 3326.3 oz

## 2021-07-01 LAB — POCT I-STAT 7, (LYTES, BLD GAS, ICA,H+H)
Acid-Base Excess: 5 mmol/L — ABNORMAL HIGH (ref 0.0–2.0)
Acid-Base Excess: 4 mmol/L — ABNORMAL HIGH (ref 0.0–2.0)
Bicarbonate: 27.2 mmol/L (ref 20.0–28.0)
Bicarbonate: 26.2 mmol/L (ref 20.0–28.0)
Calcium, Ion: 1.11 mmol/L — ABNORMAL LOW (ref 1.15–1.40)
Calcium, Ion: 1.04 mmol/L — ABNORMAL LOW (ref 1.15–1.40)
HCT: 31 % — ABNORMAL LOW (ref 36.0–46.0)
HCT: 30 % — ABNORMAL LOW (ref 36.0–46.0)
Hemoglobin: 10.5 g/dL — ABNORMAL LOW (ref 12.0–15.0)
Hemoglobin: 10.2 g/dL — ABNORMAL LOW (ref 12.0–15.0)
O2 Saturation: 96 %
O2 Saturation: 96 %
Patient temperature: 36.7
Patient temperature: 36.6
Potassium: 3.8 mmol/L (ref 3.5–5.1)
Potassium: 3.7 mmol/L (ref 3.5–5.1)
Sodium: 133 mmol/L — ABNORMAL LOW (ref 135–145)
Sodium: 135 mmol/L (ref 135–145)
TCO2: 28 mmol/L (ref 22–32)
TCO2: 27 mmol/L (ref 22–32)
pCO2 arterial: 31.2 mmHg — ABNORMAL LOW (ref 32.0–48.0)
pCO2 arterial: 30.2 mmHg — ABNORMAL LOW (ref 32.0–48.0)
pH, Arterial: 7.548 — ABNORMAL HIGH (ref 7.350–7.450)
pH, Arterial: 7.544 — ABNORMAL HIGH (ref 7.350–7.450)
pO2, Arterial: 69 mmHg — ABNORMAL LOW (ref 83.0–108.0)
pO2, Arterial: 70 mmHg — ABNORMAL LOW (ref 83.0–108.0)

## 2021-07-01 LAB — GLUCOSE, CAPILLARY
Glucose-Capillary: 100 mg/dL — ABNORMAL HIGH (ref 70–99)
Glucose-Capillary: 98 mg/dL (ref 70–99)
Glucose-Capillary: 101 mg/dL — ABNORMAL HIGH (ref 70–99)
Glucose-Capillary: 92 mg/dL (ref 70–99)
Glucose-Capillary: 106 mg/dL — ABNORMAL HIGH (ref 70–99)

## 2021-07-01 LAB — VANCOMYCIN, RANDOM: Vancomycin Rm: 22

## 2021-07-01 MED ORDER — FUROSEMIDE 10 MG/ML IJ SOLN
80.0000 mg | Freq: Once | INTRAMUSCULAR | Status: AC
  Administered 2021-07-01: 80 mg via INTRAVENOUS
  Filled 2021-07-01: qty 8

## 2021-07-01 NOTE — Progress Notes (Signed)
Received call from lab, pts PT/INR is now 42.5/4.5.  Relayed to CCM MD.

## 2021-07-01 NOTE — Progress Notes (Signed)
Attempted to reach pts son to provide updates. Unable to reach or leave vm.

## 2021-07-01 NOTE — Progress Notes (Signed)
Pt has only put out 67m since '80mg'$  of lasix. SpO2 slowly dropping, HFNC increased to 8L to maintain SpO2 of 88%. NRB tried w/ no improvement. RT aware, bipap in room if needed. Relayed to CCM MD. Plan for '80mg'$  more of lasix.

## 2021-07-01 NOTE — Progress Notes (Signed)
Pt very lethargic w/ weak, congested cough, failed bedside swallow screen. Will relay to night shift RN w/ plan to reassess.

## 2021-07-01 NOTE — Progress Notes (Signed)
NAME:  Crystal [name], MRN:  [id number], DOB:  Dec 13, 1924, LOS: 2 ADMISSION DATE:  06/12/2021, CONSULTATION DATE:  7/29 REFERRING MD:  Dr. Tomi Bamberger EDP, CHIEF COMPLAINT:  Unresponsive   History of Present Illness:  Patient is encephalopathic and/or intubated. Therefore history has been obtained from chart review.    Crystal [name] is a 87 y.o. female who presented to the [facility] ED with a chief complaint of altered mental status.   The patient was recently admitted from July 17th after a fall, received treatment for a mid humeral diaphysis fracture and an elevated INR and was discharged on the 21st. Per notes it seems that she was transitioned off warfarin to eliquis. Reports per family the past few days she was not really eating or drinking much.   Per chart review and discussion over the phone with son, the patient was given 2.5 mg of oxycodone the evening and 5 mg of Ambien of 7/29 at 2300. She slept all night and was found unresponsive this morning. EMS was called and she was found to be hypoxic and minimally responsive to painful stimuli. Narcan was given without relief.   The patient arrived to the ED and was found to be hypoxic and hypotensive.  Push dose pressors were given and she was intubated for airway protection. A code sepsis was called- 2 L of NS were given, pressors initiated, and  vanc and cefepime were started. 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 Intubated. Right IJ CVL placed. Rigth fem aline placed. Started on vanc and cefepime INR was > 8 so AC held  7/30 cultures growing GPC in blood. Dc vanc as BCID growing strep changing to rocephin . INR rising to 9.2 7/31 passed SBT. Extubated,. Pressor requirements down. BC still pending.  ECHO: Left ventricular ejection fraction, by estimation, is 60 to 65%. The left ventricle has normal function. The left ventricle has no regional wall motion abnormalities. Left ventricular diastolic parameters are consistent with Grade I diastolic dysfunction (impaired relaxation). Interim History / Subjective:  Passed SBT  Objective   Blood pressure (Abnormal) 112/49, pulse (Abnormal) 57, temperature 97.7 F (36.5 C), resp. rate 18, height '5\' 6"'$  (1.676 m), weight 94.3 kg, SpO2 91 %.    Vent Mode: PRVC FiO2 (%):  [40 %] 40 % Set Rate:  [18 bmp] 18 bmp Vt Set:  [420 mL-470 mL] 420 mL PEEP:  [5 cmH20] 5 cmH20 Plateau Pressure:  [17 cmH20-23 cmH20] 19 cmH20   Intake/Output Summary (Last 24 hours) at 07/01/2021 1016 Last data filed at 07/01/2021 0900 Gross per 24 hour  Intake 754.63 ml  Output 615 ml  Net 139.63 ml   Filed Weights   06/20/2021 1600 06/30/21 0500 07/01/21 0500  Weight: 93 kg 94.3 kg 94.3 kg    Examination: General: This is a chronically ill-appearing 87 year old female she is now more awake, interactive.  Appears comfortable on spontaneous breathing trial HEENT  normocephalic atraumatic orally intubated.  Right IJ triple-lumen catheter is in place looks unremarkable Pulmonary: Clear to auscultation diminished bases tidal volume in the mid 400 range on spontaneous breathing trial.  Appears comfortable. Cardiac: Regular rate and rhythm without murmur rub or gallop Abdomen: Soft nontender Extremities: Warm and dry generalized anasarca some scattered areas of ecchymosis right arm immobilization in place Neuro: Much more awake today following commands, purposeful.  Orientation  unable to check but seems nonfocal exam  GU: Clear yellow  Resolved Hospital Problem list     Assessment & Plan:   Acute hypoxemic respiratory failure: Secondary to aspiration pneumonia/HCAP, and probably element of left pleural effusion portable chest x-ray personally reviewed:The endotracheal tube was in satisfactory position.  The film was quite rotated.  There appears to be left greater than right airspace disease with probable element of pleural effusion. Successfully passed spontaneous breathing trial Plan Extubate  Discontinue all sedation  Supplemental oxygen with pulse oximetry  Incentive spirometry  IV Lasix x1  Repeat a.m. chest x-ray  Follow-up PT/INR with coagulopathy would not proceed with thoracentesis emergently but will need to look at this with ultrasound again on 8/1  Get her out of bed post extubation   Septic shock in the setting of GPC bacteremia-->? Source aspiration, GI translocation?  -Weaning pressors; WBC ct decreasing  Plan Continue to wean pressors for systolic blood pressure greater than 100 ceftriaxone day #2 awaiting sensitivities  Keep euvolemic  TTE did not show evidence of vegetation, will repeat blood cultures today.  We are going to need to decide whether or not she needs TEE, however given advanced age reluctant to offer  Acute renal failure: most likely pre-renal. Baseline creatinine 1, but 2.7 on admission  & slowly improving  Plan Renal dose medications Keep euvolemic Strict intake output A.m. chemistry We will keep Foley catheter in place for 1 more day  Intermittent fluid and electrolyte imbalance: Mild hyponatremia Plan IV Lasix x1  Acute toxic and metabolic encephalopathy: sepsis, meds  Plan Discontinuing all sedation Continue Neurontin at half her normal dosing  Hx DVT/PE w/ acute coagulopathy  with lifelong AC. Switched to Eliquis during previous hospitalization as she has been unable to keep up with INR checks on  warfarin. - Hold Eliquis with INR 8 and renal failure - Not clear how significant supratherapeutic INR is, as she is on Eliquis not warfarin.  Plan Continue to hold her DOAC Repeat INR SCDs  COPD without acute exacerbation no bronchodilators on home med list Plan Scheduled bronchodilators  HTN Long QTC Plan Holding her home antihypertensives  Hypothyroid Plan Continue Synthroid  Goals of care: wheelchair bound, requires significant assistance with ADLs, and has began to demonstrate some mental decline as well. He wants [place] to aggressively treat medical conditions, but would not want her to undergo CPR if her heart were to stop. Ok with continued mechanical ventilation.      Best Practice (right click and "Reselect all SmartList Selections" daily)   Diet/type: clear liquids DVT prophylaxis:, Give her some Lasix GI prophylaxis: PPI Lines: Central line Foley:  Yes, and it is still needed Code Status:  DNR Last date of multidisciplinary goals of care discussion [ 7/29 ]  Critical care time: 31 minutes    Erick Colace ACNP-BC [place] Pager # [phone] OR # [phone] if no answer

## 2021-07-01 NOTE — Progress Notes (Addendum)
Pt extubated to 6L HFNC.  Pt vitals wnl.

## 2021-07-01 NOTE — Progress Notes (Signed)
   07/01/21 0802  Airway 7.5 mm  Placement Date/Time: 06/01/2021 1052   Placed By: ED Physician  Airway Device: Endotracheal Tube  Laryngoscope Blade: MAC;3  ETT Types: Oral  Size (mm): 7.5 mm  Cuffed: Cuffed  Insertion attempts: 2  Airway Equipment: Stylet;Video Laryngoscope  Placemen...  Secured at (cm) 23 cm  Measured From Lips  Secured Location Right  Secured By Actuary Repositioned Yes  Prone position No  Cuff Pressure (cm H2O) Green OR 18-26 CmH2O  Site Condition Dry  Adult Ventilator Settings  Vent Type Servo i  Humidity HME  Vent Mode PRVC  Vt Set 420 mL  Set Rate 18 bmp  FiO2 (%) 40 %  I Time 1 Sec(s)  PEEP 5 cmH20  Adult Ventilator Measurements  Peak Airway Pressure 22 L/min  Mean Airway Pressure 11 cmH20  Plateau Pressure 19 cmH20  Resp Rate Spontaneous 0 br/min  Resp Rate Total 18 br/min  Exhaled Vt 422 mL  Measured Ve 7.6 mL  I:E Ratio Measured 1:2  Auto PEEP 0 cmH20  Total PEEP 5 cmH20  SpO2 91 %  Adult Ventilator Alarms  Alarms On Y  Ve High Alarm 25 L/min  Ve Low Alarm 4 L/min  Resp Rate High Alarm 38 br/min  Resp Rate Low Alarm 8  PEEP Low Alarm 4 cmH2O  Press High Alarm 45 cmH2O  VAP Prevention  HME changed No  Ventilator changed No  Transported while on vent No  HOB> 30 Degrees Y  Equipment wiped down Yes  Daily Weaning Assessment  Daily Assessment of Readiness to Wean Wean protocol criteria not met  Reason not met Apnea  Breath Sounds  Bilateral Breath Sounds Diminished  Airway Suctioning/Secretions  Suction Type ETT  Suction Device  Catheter  Secretion Amount Moderate  Secretion Color Other (Comment) (brown)  Secretion Consistency Thick  Suction Tolerance Tolerated fairly well  Suctioning Adverse Effects Anxiety

## 2021-07-01 NOTE — Procedures (Signed)
Extubation Procedure Note  Patient Details:   Name: Crystal [name] DOB: [birth year] MRN: [id number]   Airway Documentation:    Vent end date: 07/01/21 Vent end time: 1104   Evaluation  O2 sats: stable throughout Complications: No apparent complications Patient did tolerate procedure well. Bilateral Breath Sounds: Diminished  Patient able to speak: Yes  Rudene Re 07/01/2021, 11:06 AM

## 2021-07-01 NOTE — Progress Notes (Signed)
[facility] Progress Note Patient Name: Crystal [name] DOB: [birth year] MRN: [id number]   Date of Service  07/01/2021  HPI/Events of Note  ABG noted. VT is at 470, this is 8 cc/kg IBW  eICU Interventions  Lower VT to 420. Continue other settings. Repeat ABG in 2 hours      Intervention Category Major Interventions: Respiratory failure - evaluation and management  Gurpreet Mikhail G Delesia Martinek 07/01/2021, 4:27 AM

## 2021-07-02 DIAGNOSIS — Z66 Do not resuscitate: Secondary | ICD-10-CM

## 2021-07-02 DIAGNOSIS — J9601 Acute respiratory failure with hypoxia: Secondary | ICD-10-CM | POA: Diagnosis not present

## 2021-07-02 DIAGNOSIS — Z7189 Other specified counseling: Secondary | ICD-10-CM

## 2021-07-02 DIAGNOSIS — Z515 Encounter for palliative care: Secondary | ICD-10-CM

## 2021-07-02 LAB — CBC
HCT: 33.7 % — ABNORMAL LOW (ref 36.0–46.0)
Hemoglobin: 10.4 g/dL — ABNORMAL LOW (ref 12.0–15.0)
MCH: 31.7 pg (ref 26.0–34.0)
MCHC: 30.9 g/dL (ref 30.0–36.0)
MCV: 102.7 fL — ABNORMAL HIGH (ref 80.0–100.0)
Platelets: 300 10*3/uL (ref 150–400)
RBC: 3.28 MIL/uL — ABNORMAL LOW (ref 3.87–5.11)
RDW: 17.3 % — ABNORMAL HIGH (ref 11.5–15.5)
WBC: 20.3 10*3/uL — ABNORMAL HIGH (ref 4.0–10.5)
nRBC: 0 % (ref 0.0–0.2)

## 2021-07-02 LAB — COMPREHENSIVE METABOLIC PANEL
ALT: 13 U/L (ref 0–44)
AST: 17 U/L (ref 15–41)
Albumin: 1.9 g/dL — ABNORMAL LOW (ref 3.5–5.0)
Alkaline Phosphatase: 71 U/L (ref 38–126)
Anion gap: 9 (ref 5–15)
BUN: 46 mg/dL — ABNORMAL HIGH (ref 8–23)
CO2: 30 mmol/L (ref 22–32)
Calcium: 8.1 mg/dL — ABNORMAL LOW (ref 8.9–10.3)
Chloride: 98 mmol/L (ref 98–111)
Creatinine, Ser: 2.07 mg/dL — ABNORMAL HIGH (ref 0.44–1.00)
GFR, Estimated: 22 mL/min — ABNORMAL LOW (ref >60)
Glucose, Bld: 106 mg/dL — ABNORMAL HIGH (ref 70–99)
Potassium: 4.3 mmol/L (ref 3.5–5.1)
Sodium: 137 mmol/L (ref 135–145)
Total Bilirubin: 0.9 mg/dL (ref 0.3–1.2)
Total Protein: 5.7 g/dL — ABNORMAL LOW (ref 6.5–8.1)

## 2021-07-02 LAB — BLOOD GAS, ARTERIAL
Acid-Base Excess: 2.8 mmol/L — ABNORMAL HIGH (ref 0.0–2.0)
Bicarbonate: 29.9 mmol/L — ABNORMAL HIGH (ref 20.0–28.0)
Drawn by: 42783
FIO2: 80
O2 Saturation: 87.7 %
Patient temperature: 37
pCO2 arterial: 75.8 mmHg (ref 32.0–48.0)
pH, Arterial: 7.22 — ABNORMAL LOW (ref 7.350–7.450)
pO2, Arterial: 56.6 mmHg — ABNORMAL LOW (ref 83.0–108.0)

## 2021-07-02 LAB — PROTIME-INR
INR: 2.9 — ABNORMAL HIGH (ref 0.8–1.2)
Prothrombin Time: 30.5 seconds — ABNORMAL HIGH (ref 11.4–15.2)

## 2021-07-02 LAB — GLUCOSE, CAPILLARY
Glucose-Capillary: 100 mg/dL — ABNORMAL HIGH (ref 70–99)
Glucose-Capillary: 105 mg/dL — ABNORMAL HIGH (ref 70–99)
Glucose-Capillary: 104 mg/dL — ABNORMAL HIGH (ref 70–99)

## 2021-07-02 LAB — CULTURE, BLOOD (ROUTINE X 2): Special Requests: ADEQUATE

## 2021-07-02 MED ORDER — GLYCOPYRROLATE 0.2 MG/ML IJ SOLN: 0.2000 mg | INTRAMUSCULAR | Status: DC | PRN

## 2021-07-02 MED ORDER — GLYCOPYRROLATE 1 MG PO TABS: 1.0000 mg | ORAL_TABLET | ORAL | Status: DC | PRN

## 2021-07-02 MED ORDER — GLYCOPYRROLATE 0.2 MG/ML IJ SOLN
0.2000 mg | INTRAMUSCULAR | Status: DC | PRN
  Administered 2021-07-02: 0.2 mg via INTRAVENOUS
  Filled 2021-07-02: qty 1

## 2021-07-02 MED ORDER — POLYVINYL ALCOHOL 1.4 % OP SOLN
1.0000 [drp] | Freq: Four times a day (QID) | OPHTHALMIC | Status: DC | PRN
  Filled 2021-07-02: qty 15

## 2021-07-02 MED ORDER — BIOTENE DRY MOUTH MT LIQD: 15.0000 mL | OROMUCOSAL | Status: DC | PRN

## 2021-07-02 MED ORDER — ACETAMINOPHEN 325 MG PO TABS: 650.0000 mg | ORAL_TABLET | Freq: Four times a day (QID) | ORAL | Status: DC | PRN

## 2021-07-02 MED ORDER — HYDROMORPHONE HCL 1 MG/ML IJ SOLN
0.5000 mg | INTRAMUSCULAR | Status: DC | PRN
  Administered 2021-07-02: 1 mg via INTRAVENOUS
  Filled 2021-07-02: qty 1

## 2021-07-02 MED ORDER — ALBUMIN HUMAN 25 % IV SOLN
25.0000 g | Freq: Four times a day (QID) | INTRAVENOUS | Status: DC
  Administered 2021-07-02: 25 g via INTRAVENOUS
  Filled 2021-07-02: qty 100

## 2021-07-02 MED ORDER — LATANOPROST 0.005 % OP SOLN
1.0000 [drp] | Freq: Every day | OPHTHALMIC | Status: DC
  Filled 2021-07-02: qty 2.5

## 2021-07-02 MED ORDER — ACETAMINOPHEN 650 MG RE SUPP: 650.0000 mg | Freq: Four times a day (QID) | RECTAL | Status: DC | PRN

## 2021-07-02 MED ORDER — LORAZEPAM 2 MG/ML IJ SOLN
0.5000 mg | INTRAMUSCULAR | Status: DC | PRN
  Administered 2021-07-02: 1 mg via INTRAVENOUS
  Filled 2021-07-02: qty 1

## 2021-07-02 DEATH — deceased

## 2021-07-04 LAB — CULTURE, BLOOD (ROUTINE X 2)
Culture: NO GROWTH
Special Requests: ADEQUATE

## 2021-07-06 LAB — CULTURE, BLOOD (ROUTINE X 2)
Culture: NO GROWTH
Culture: NO GROWTH

## 2021-08-02 NOTE — Progress Notes (Signed)
Patient transitioned to comfort care. See MAR for comfort medications given. Levophed stopped, oxygen weaned to 2L for comfort. Patient appears to be in no distress.   HR 88  Oxygen 63% BP 54/18

## 2021-08-02 NOTE — Progress Notes (Signed)
NAME:  Crystal [name], MRN:  [id number], DOB:  03-Jan-1925, LOS: 3 ADMISSION DATE:  06/21/2021, CONSULTATION DATE:  7/29 REFERRING MD:  Dr. Tomi Bamberger EDP, CHIEF COMPLAINT:  Unresponsive   History of Present Illness:  Patient is encephalopathic and/or intubated. Therefore history has been obtained from chart review.    KAMERIN VANTIEM is a 87 y.o. female who presented to the [facility] ED with a chief complaint of altered mental status.   The patient was recently admitted from July 17th after a fall, received treatment for a mid humeral diaphysis fracture and an elevated INR and was discharged on the 21st. Per notes it seems that she was transitioned off warfarin to eliquis. Reports per family the past few days she was not really eating or drinking much.   Per chart review and discussion over the phone with son, the patient was given 2.5 mg of oxycodone the evening and 5 mg of Ambien of 7/29 at 2300. She slept all night and was found unresponsive this morning. EMS was called and she was found to be hypoxic and minimally responsive to painful stimuli. Narcan was given without relief.   The patient arrived to the ED and was found to be hypoxic and hypotensive.  Push dose pressors were given and she was intubated for airway protection. A code sepsis was called- 2 L of NS were given, pressors initiated, and  vanc and cefepime were started. 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 Intubated. Right IJ CVL placed. Rigth fem aline placed. Started on vanc and cefepime INR was > 8 so AC held  7/30 cultures growing GPC in blood. Dc vanc as BCID growing strep changing to rocephin . INR rising to 9.2 7/31 passed SBT. Extubated,. Pressor requirements down. BC still pending.  ECHO: Left ventricular ejection fraction, by estimation, is 60 to 65%. The left ventricle has normal function. The left ventricle has no regional wall motion abnormalities. Left ventricular diastolic parameters are consistent with Grade I diastolic dysfunction (impaired relaxation). Interim History / Subjective:  Extubated, confused, not really providing any subjective. Not tolerating any PO. Poor urine output.  Objective   Blood pressure (!) 112/49, pulse 78, temperature (!) 97.4 F (36.3 C), temperature source Axillary, resp. rate 18, height '5\' 6"'$  (1.676 m), weight 98.7 kg, SpO2 90 %. CVP:  [10 mmHg-12 mmHg] 12 mmHg  Vent Mode: PRVC FiO2 (%):  [40 %] 40 % Set Rate:  [18 bmp] 18 bmp Vt Set:  [420 mL] 420 mL PEEP:  [5 cmH20] 5 cmH20 Plateau Pressure:  [19 cmH20] 19 cmH20   Intake/Output Summary (Last 24 hours) at July 19, 2021 0730 Last data filed at July 19, 2021 0700 Gross per 24 hour  Intake 636.27 ml  Output 365 ml  Net 271.27 ml    Filed Weights   06/30/21 0500 07/01/21 0500 07/19/21 0431  Weight: 94.3 kg 94.3 kg 98.7 kg    Examination: Constitutional: frail elderly  woman in NAD  Eyes: EOMI, pupils equal Ears, nose, mouth, and throat: MM dry, trachea midline Cardiovascular: RRR, ext warm Respiratory: Diminished bases due to poor inspiratory efforts, no accessory muscle use Gastrointestinal: soft, hypoactive BS Skin: No rashes, normal turgor Neurologic: able to get her to wiggle her left toes some but not much Psychiatric: RASS -1  Cr slightly improved Albumin 1.9 LFTs stable WBC 20, stable Stable anemia Plts stable CXR interstitial edema, rotated, probable L effusion:  bedside US shows its pretty small Coagulopathy improved  Resolved Hospital Problem list     Assessment & Plan:  Acute hypoxemic respiratory failure- multifactorial secondary to increased metabolic demand from sepsis, poor nutritional status leading to third spacing, and muscular deconditioning; element of aspiration vs. CAP possible as well.  Strep viridans bacteremia- may need TEE depending on GOC; TTE neg; rocpehin  FTT, memory issues, frail elderly, DNR- will have palliative come help discussions with son, I think patient would do well in hospice  Warfarin coagulopathy- suspect related to poor PO, improving, why does she have eliquis and warafrin on her home med list?  COPD- not in flare, Bds PRN  Dysphagia- will need cortrak unless mental status turns around  Shock- related to infection, poor oncotic pressure - Levophed titrated to MAP 65 - Albumin 100g 25% over next 24h - Midodrine if/when has PO access  Chronic pain and anxiety- hold home meds for now given somnolence  Acute metabolic encephalopathy- in setting of progressive decline and likely dementia development as OP; check ABG; supportive care, [place] discussions  Hypothyroidism- synthroid  Other chronic issues see orders   Best Practice (right click and "Reselect all SmartList Selections" daily)   Diet/type:  NPO pending SLP eval DVT prophylaxis:, Give her some Lasix GI prophylaxis: PPI Lines: Central line Foley:  Yes, and it is still needed Code Status:  DNR Last date of multidisciplinary goals of care discussion [ 7/29 ]; next will have palliative help; again think patient is in dying process   Patient critically ill due to shock Interventions to address this today levophed titration Risk of deterioration without these interventions is high  I personally spent 34 minutes providing critical care not including any separately billable procedures  Erskine Emery MD [facility]  Prefer epic  messenger for cross cover needs If after hours, please call E-link

## 2021-08-02 NOTE — Progress Notes (Signed)
RT placed pt on BiPAP per Dr. Tamala Julian based on pt's ABG. Pt is now on BiPAP and tolerating well.

## 2021-08-02 NOTE — Progress Notes (Signed)
[facility] Progress Note Patient Name: Crystal [name] DOB: [birth year] MRN: [id number]   Date of Service  27-Jul-2021  HPI/Events of Note  Notified of oliguria without significant response to furosemide Inquiry if albumin may be given  eICU Interventions  Patient is 4 liters positive. Unlikely to benefit with more volume If albumin to be given will need to follow with lasix. Will defer for now as already received 160 mg furosemide and continues to be on norepinephrine     Intervention Category Intermediate Interventions: Oliguria - evaluation and management  Judd Lien 2021-07-27, 3:43 AM

## 2021-08-02 NOTE — Death Summary Note (Signed)
DEATH SUMMARY   Patient Details  Name: Crystal [name] MRN: [id number] DOB: 07/28/1925  Admission/Discharge Information   Admit Date:  2021-07-01  Date of Death: Date of Death: 04-Jul-2021  Time of Death: Time of Death: 2  Length of Stay: 3  Referring Physician: Lajean Manes, MD   Reason(s) for Hospitalization  - Strep viridans baceteremia with septic shock - Acute respiratoray failure - Frail elderly, DNR, dementia, FTT  Brief Hospital Course (including significant findings, care, treatment, and services provided and events leading to death)  Crystal [name] is a 87 y.o. female who presented to the [facility] ED with a chief complaint of altered mental status.   The patient was recently admitted from July 17th after a fall, received treatment for a mid humeral diaphysis fracture and an elevated INR and was discharged on the 21st. Per notes it seems that she was transitioned off warfarin to eliquis. Reports per family the past few days she was not really eating or drinking much.   Per chart review and discussion over the phone with son, the patient was given 2.5 mg of oxycodone the evening and 5 mg of Ambien of Jul 02, 2023 at 2300. She slept all night and was found unresponsive this morning. EMS was called and she was found to be hypoxic and minimally responsive to painful stimuli. Narcan was given without relief.   The patient arrived to the ED and was found to be hypoxic and hypotensive.  Push dose pressors were given and she was intubated for airway protection. A code sepsis was called- 2 L of NS were given, pressors initiated, and  vanc and cefepime were started. 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  Given appropriate antibiotics.  On 07-05-23, ABG showed recurrent CO2 retention.  In light of patient's progressive decline at home, son wanted her to be allowed to pass in peace.   Pertinent Labs and Studies  Significant Diagnostic Studies DG Pelvis 1-2 Views  Result Date: 06/17/2021 CLINICAL DATA:  Acute pelvic pain following fall. Initial encounter. EXAM: PELVIS - 1-2 VIEW COMPARISON:  None. FINDINGS: No acute fracture or dislocation identified. No focal bony lesions are present. Surgical changes within the lumbar spine are present. IMPRESSION: No acute abnormality. Electronically Signed   By: Margarette Canada M.D.   On: 06/17/2021 13:18   DG Forearm Right  Result Date: 06/17/2021 CLINICAL DATA:  Acute RIGHT forearm pain following fall. Initial encounter. EXAM: RIGHT FOREARM - 2 VIEW COMPARISON:  None. FINDINGS: No acute fracture, subluxation or dislocation identified. Diffuse osteopenia is noted. Heavy vascular calcifications are present. IMPRESSION: 1. No acute bony abnormality. 2. Diffuse osteopenia. Electronically Signed   By: Margarette Canada M.D.   On: 06/17/2021 13:17   DG Wrist Complete Right  Result Date: 06/17/2021 CLINICAL DATA:  Fall. EXAM: RIGHT WRIST - COMPLETE 3+ VIEW COMPARISON:  None. FINDINGS: No distal radius fracture. No carpal bone fracture. The radiocarpal joint is intact. Abnormal articulation of the radius and ulna with the shortened ulna approximating the radial head. Extensive vascular calcifications. IMPRESSION: 1. No acute fracture of the wrist. 2. Chronic distal forearm  deformity with ulnar radial pseudoarticulation. Electronically Signed   By: Suzy Bouchard M.D.   On: 06/17/2021 13:24   DG Abd 1 View  Result Date: 06/04/2021 CLINICAL DATA:  OG tube placement. EXAM: ABDOMEN - 1 VIEW COMPARISON:  Single-view of the abdomen 01/24/2014. FINDINGS: OG tube tip is seen at the junction of the middle and distal  thirds of the esophagus. Recommend advancement of approximately 15 cm. Bowel gas pattern is unremarkable. Spinal fusion hardware and IVC filter noted. IMPRESSION: Recommend 15 cm advancement of the patient's OG tube. Electronically Signed   By: Inge Rise M.D.   On: 06/24/2021 17:18   CT Head Wo Contrast  Result Date: 06/30/2021 CLINICAL DATA:  Mental status change of unknown etiology EXAM: CT HEAD WITHOUT CONTRAST TECHNIQUE: Contiguous axial images were obtained from the base of the skull through the vertex without intravenous contrast. COMPARISON:  06/17/2021. FINDINGS: Brain: No evidence of acute infarction, hemorrhage, hydrocephalus, extra-axial collection or mass lesion/mass effect. There is mild diffuse low-attenuation within the subcortical and periventricular white matter compatible with chronic microvascular disease. Chronic bilateral basal ganglial lacunar infarcts. Left frontal lobe encephalomalacia is again noted compatible with previous infarct. Vascular: No hyperdense vessel or unexpected calcification. Skull: Normal. Negative for fracture or focal lesion. Sinuses/Orbits: Postoperative change involving the right maxillary sinus with chronic mucoperiosteal thickening noted. No acute abnormality identified. Other: None IMPRESSION: 1. No acute intracranial abnormalities. 2. Chronic small vessel ischemic disease and brain atrophy. 3. Chronic left frontal lobe infarct. Electronically Signed   By: Kerby Moors M.D.   On: 06/28/2021 12:44   CT Head Wo Contrast  Result Date: 06/17/2021 CLINICAL DATA:  87 year old female with fall and head injury. Currently on blood thinners. EXAM: CT HEAD WITHOUT CONTRAST TECHNIQUE: Contiguous axial images were obtained from the base of the skull through the vertex without intravenous contrast. COMPARISON:  09/02/2010 MR FINDINGS: Brain: No evidence of acute infarction, hemorrhage, hydrocephalus, extra-axial collection or mass lesion/mass effect. White matter  hypodensities and remote basal ganglia lacunar infarcts are noted. Vascular: Carotid atherosclerotic calcifications are noted. Skull: Normal. Negative for fracture or focal lesion. Sinuses/Orbits: No acute abnormality Other: None IMPRESSION: 1. No evidence of acute intracranial abnormality. 2. Chronic small vessel ischemic changes and remote basal ganglia lacunar infarcts. Electronically Signed   By: Margarette Canada M.D.   On: 06/17/2021 16:28   DG CHEST PORT 1 VIEW  Result Date: 07/01/2021 CLINICAL DATA:  Respiratory failure. EXAM: PORTABLE CHEST 1 VIEW COMPARISON:  06/30/2021. FINDINGS: ETT tip is above the carina. Right IJ catheter tip projects over the SVC. Stable cardiomediastinal contours. Increase diffuse hazy opacification of the left lung is identified compatible with worsening edema and/or pleural effusion. Right lung remains clear. Marked scoliosis deformity is convex towards the left. IMPRESSION: Worsening aeration to the left lung. Electronically Signed   By: Kerby Moors M.D.   On: 07/01/2021 08:08   DG Chest Port 1 View  Result Date: 06/30/2021 CLINICAL DATA:  Hypoxia. EXAM: PORTABLE CHEST 1 VIEW COMPARISON:  06/03/2021 FINDINGS: There is a right IJ catheter with tip in the projection of the SVC. ET tube tip is above the carina. Right lung clear. Veil like opacification of the left mid lung and periphery of the left base compatible with pleural effusion. Worsening opacification of the retrocardiac left lung base which may reflect new atelectasis. IMPRESSION: 1. Persistent left pleural effusion 2. Worsening aeration to the retrocardiac left base. 3. Stable support apparatus. Electronically Signed   By: Kerby Moors  M.D.   On: 06/30/2021 08:17   DG CHEST PORT 1 VIEW  Result Date: 06/24/2021 CLINICAL DATA:  OG tube and central line placement. EXAM: PORTABLE CHEST 1 VIEW COMPARISON:  Single-view of the chest today at 2:33 p.m. FINDINGS: OG tube has been advanced slightly with its tip now at  the junction of the mid and distal esophagus. The tube should be advanced approximately 15 cm for better positioning. Right IJ catheter and ET tube project in good position, unchanged. Right pleural effusion is markedly decreased. Hazy opacity over the left chest compatible with layering pleural effusion again seen. Heart size is normal. IMPRESSION: OG tube has been advanced slightly. Recommend further advancement of approximately 15 cm. Marked decrease in a right pleural effusion since the exam earlier today. Findings compatible with a layering left pleural effusion and basilar atelectasis are unchanged. Electronically Signed   By: Inge Rise M.D.   On: 06/28/2021 17:13   DG Chest Port 1 View  Result Date: 06/27/2021 CLINICAL DATA:  verify ETT / central line placement EXAM: PORTABLE CHEST 1 VIEW COMPARISON:  Radiograph 06/22/2021, chest CT 03/08/2018 FINDINGS: The patient is rotated to the right. Endotracheal tube overlies the midthoracic trachea. There is a nasogastric tube which overlies the mid esophagus, unchanged. Right approach catheter overlies the proximal superior vena cava. Unchanged cardiomediastinal silhouette. Unchanged moderate size right pleural effusion with adjacent lower lung consolidation. No visible pneumothorax. Bones are unchanged. IMPRESSION: Endotracheal tube tip overlies the midthoracic trachea. Nasogastric tube tip overlies the mid esophagus, recommend advancement. Central catheter tip overlies the proximal superior vena cava. Unchanged moderate size right pleural effusion with adjacent lower lung consolidation which could be lobar collapse or infection. Electronically Signed   By: Maurine Simmering   On: 06/06/2021 15:03   DG Chest Port 1 View  Result Date: 06/13/2021 CLINICAL DATA:  ET tube, central line placement. Found unresponsive. EXAM: PORTABLE CHEST 1 VIEW COMPARISON:  06/17/2021. FINDINGS: Interval intubation with the endotracheal tube tip approximately 1.5 cm above the  carina. Right IJ central venous catheter with the tip projecting at the expected location of the SVC. Gastric tube tip projects approximately 7.9 cm above the GE junction. Patient rotation and portable supine imaging limits evaluation. Within this limitation, there appears to be a new small to moderate right pleural effusion, cardiomegaly, and mild diffuse interstitial prominence. New overlying right basilar opacity. Previously seen reticulonodular opacity in the right midlung appears persistent with volume loss on the right. No visible pneumothorax on this single supine radiograph. Enlarged cardiac silhouette, similar to prior. Similar widening of the mediastinum, likely accentuated by AP technique and patient positioning. Suspected hiatal hernia, better characterized on prior CT. IMPRESSION: 1. Endotracheal tube tip approximately 1.5 cm above the carina. Consider retraction by approximately 2.5 cm. 2. Gastric tube tip projects approximately 7.9 cm above the GE junction. Recommend advancement if intragastric position is desired. 3. Right IJ central venous catheter with the tip projecting at the expected location of the SVC. 4. Patient rotation and portable supine imaging limits evaluation. Within this limitation, there appears to be a new small to moderate right pleural effusion, cardiomegaly, and mild diffuse interstitial prominence suggestive of mild interstitial edema. New overlying right basilar opacity could represent atelectasis or pneumonia. 5. Previously seen reticulonodular opacity in the right midlung appears persistent with volume loss on the right. Given persistence, recommend short interval follow-up radiographs or CT chest to exclude malignancy. 6. Similar widening of the mediastinum, likely accentuated by AP technique and patient  positioning. Electronically Signed   By: Margaretha Sheffield MD   On: 06/28/2021 12:36   DG Chest Portable 1 View  Result Date: 06/17/2021 CLINICAL DATA:  Fall. EXAM:  PORTABLE CHEST 1 VIEW COMPARISON:  Chest x-ray dated March 08, 2018. FINDINGS: The patient is rotated to the right. Stable cardiomediastinal silhouette. New reticulonodular densities in the right mid lung. No focal consolidation, pleural effusion, or pneumothorax. No acute osseous abnormality. IMPRESSION: 1. New reticulonodular densities in the right mid lung, suspicious for bronchiolitis/atypical infection. Electronically Signed   By: Titus Dubin M.D.   On: 06/17/2021 12:47   DG Knee Complete 4 Views Left  Result Date: 06/17/2021 CLINICAL DATA:  Knee pain after fall. EXAM: LEFT KNEE - COMPLETE 4+ VIEW COMPARISON:  May 10, 2005 FINDINGS: No evidence of fracture, dislocation, or joint effusion. Intact 3 component left knee arthroplasty. Vascular calcifications noted.  Mild diffuse soft tissue swelling. IMPRESSION: 1. No acute fracture or dislocation identified about the left knee. 2. Mild diffuse soft tissue swelling. 3. Intact 3 component left knee arthroplasty. Electronically Signed   By: Fidela Salisbury M.D.   On: 06/17/2021 13:16   DG Knee Complete 4 Views Right  Result Date: 06/17/2021 CLINICAL DATA:  Acute RIGHT knee pain following fall. Initial encounter. EXAM: RIGHT KNEE - COMPLETE 4+ VIEW COMPARISON:  04/13/2004 FINDINGS: Total knee arthroplasty changes noted. No acute fracture, dislocation or joint effusion noted. No hardware complicating features are noted. IMPRESSION: No acute abnormality. Electronically Signed   By: Margarette Canada M.D.   On: 06/17/2021 13:16   DG Humerus Right  Result Date: 06/17/2021 CLINICAL DATA:  Fall. EXAM: RIGHT HUMERUS - 2+ VIEW COMPARISON:  None. FINDINGS: Acute comminuted fracture of the mid humeral diaphysis with mild apex anterior angulation. There is a 2.6 cm butterfly fragment. The dominant fracture fragments demonstrate minimal posterior displacement and mild overriding measuring 8 mm. The right shoulder and elbow are grossly intact. Osteopenia. Soft tissues  are unremarkable. IMPRESSION: 1. Acute comminuted fracture of the mid humeral diaphysis. Electronically Signed   By: Titus Dubin M.D.   On: 06/17/2021 12:49   ECHOCARDIOGRAM COMPLETE  Result Date: 07/01/2021    ECHOCARDIOGRAM REPORT   Patient Name:   KAYTLINN OPSAHL Date of Exam: 06/30/2021 Medical Rec #:  [id number]         Height:       66.0 in Accession #:    [id number]        Weight:       207.9 lb Date of Birth:  Mar 26, 1925        BSA:          2.033 m Patient Age:    28 years          BP:           87/42 mmHg Patient Gender: F                 HR:           57 bpm. Exam Location:  Inpatient Procedure: 2D Echo Indications:    acute respiratory distress  History:        Patient has prior history of Echocardiogram examinations, most                 recent 01/18/2014. COPD and sepsis, Arrythmias:second degree                 heart block, Signs/Symptoms:Altered Mental Status; Risk  Factors:Hypertension.  Sonographer:    Johny Chess Referring Phys: [id number] Estill Cotta  Sonographer Comments: Echo performed with patient supine and on artificial respirator. IMPRESSIONS  1. Left ventricular ejection fraction, by estimation, is 60 to 65%. The left ventricle has normal function. The left ventricle has no regional wall motion abnormalities. Left ventricular diastolic parameters are consistent with Grade I diastolic dysfunction (impaired relaxation).  2. Right ventricular systolic function is normal. The right ventricular size is normal. There is moderately elevated pulmonary artery systolic pressure.  3. The mitral valve is normal in structure. No evidence of mitral valve regurgitation. No evidence of mitral stenosis.  4. The aortic valve is normal in structure. Aortic valve regurgitation is not visualized. No aortic stenosis is present.  5. There is mild (Grade II) echolucent plaque involving the transverse aorta.  6. The inferior vena cava is dilated in size with <50% respiratory  variability, suggesting right atrial pressure of 15 mmHg. FINDINGS  Left Ventricle: Left ventricular ejection fraction, by estimation, is 60 to 65%. The left ventricle has normal function. The left ventricle has no regional wall motion abnormalities. The left ventricular internal cavity size was normal in size. There is  no left ventricular hypertrophy. Left ventricular diastolic parameters are consistent with Grade I diastolic dysfunction (impaired relaxation). Indeterminate filling pressures. Right Ventricle: The right ventricular size is normal. No increase in right ventricular wall thickness. Right ventricular systolic function is normal. There is moderately elevated pulmonary artery systolic pressure. The tricuspid regurgitant velocity is 3.07 m/s, and with an assumed right atrial pressure of 15 mmHg, the estimated right ventricular systolic pressure is 123456 mmHg. Left Atrium: Left atrial size was normal in size. Right Atrium: Right atrial size was normal in size. Pericardium: There is no evidence of pericardial effusion. Mitral Valve: The mitral valve is normal in structure. Mild mitral annular calcification. No evidence of mitral valve regurgitation. No evidence of mitral valve stenosis. Tricuspid Valve: The tricuspid valve is normal in structure. Tricuspid valve regurgitation is mild . No evidence of tricuspid stenosis. Aortic Valve: The aortic valve is normal in structure. Aortic valve regurgitation is not visualized. No aortic stenosis is present. Pulmonic Valve: The pulmonic valve was normal in structure. Pulmonic valve regurgitation is not visualized. No evidence of pulmonic stenosis. Aorta: The aortic root is normal in size and structure. There is mild (Grade II) echolucent plaque involving the transverse aorta. Venous: The inferior vena cava is dilated in size with less than 50% respiratory variability, suggesting right atrial pressure of 15 mmHg. IAS/Shunts: No atrial level shunt detected by color  flow Doppler.  LEFT VENTRICLE PLAX 2D LVIDd:         3.30 cm  Diastology LVIDs:         2.20 cm  LV e' medial:    6.64 cm/s LV PW:         0.90 cm  LV E/e' medial:  9.6 LV IVS:        0.90 cm  LV e' lateral:   5.44 cm/s LVOT diam:     1.70 cm  LV E/e' lateral: 11.7 LV SV:         52 LV SV Index:   25 LVOT Area:     2.27 cm  RIGHT VENTRICLE             IVC RV S prime:     14.60 cm/s  IVC diam: 2.50 cm TAPSE (M-mode): 1.8 cm LEFT ATRIUM  Index       RIGHT ATRIUM           Index LA diam:      3.40 cm 1.67 cm/m  RA Area:     14.90 cm LA Vol (A2C): 44.2 ml 21.74 ml/m RA Volume:   38.10 ml  18.74 ml/m LA Vol (A4C): 50.2 ml 24.69 ml/m  AORTIC VALVE LVOT Vmax:   96.40 cm/s LVOT Vmean:  60.400 cm/s LVOT VTI:    0.227 m  AORTA Ao Root diam: 3.30 cm Ao Asc diam:  2.70 cm MITRAL VALVE                TRICUSPID VALVE MV Area (PHT): 2.29 cm     TR Peak grad:   37.7 mmHg MV Decel Time: 331 msec     TR Vmax:        307.00 cm/s MV E velocity: 63.80 cm/s MV A velocity: 103.00 cm/s  SHUNTS MV E/A ratio:  0.62         Systemic VTI:  0.23 m                             Systemic Diam: 1.70 cm Skeet Latch MD Electronically signed by Skeet Latch MD Signature Date/Time: 07/01/2021/8:30:29 AM    Final     Microbiology Recent Results (from the past 240 hour(s))  Culture, blood (Routine x 2)     Status: Abnormal   Collection Time: 06/28/2021 11:24 AM   Specimen: BLOOD  Result Value Ref Range Status   Specimen Description BLOOD SITE NOT SPECIFIED  Final   Special Requests   Final    BOTTLES DRAWN AEROBIC AND ANAEROBIC Blood Culture adequate volume   Culture  Setup Time   Final    GRAM POSITIVE COCCI IN BOTH AEROBIC AND ANAEROBIC BOTTLES CRITICAL RESULT CALLED TO, READ BACK BY AND VERIFIED WITH: CATHY PIERCE PHARMD '@1021'$  06/30/21 EB    Culture (A)  Final    STREPTOCOCCUS MITIS/ORALIS THE SIGNIFICANCE OF ISOLATING THIS ORGANISM FROM A SINGLE SET OF BLOOD CULTURES WHEN MULTIPLE SETS ARE DRAWN IS UNCERTAIN.  PLEASE NOTIFY THE MICROBIOLOGY DEPARTMENT WITHIN ONE WEEK IF SPECIATION AND SENSITIVITIES ARE REQUIRED. Performed at [facility], [place] [address], [place],  [address]    Report Status Jul 10, 2021 FINAL  Final  Blood Culture ID Panel (Reflexed)     Status: Abnormal   Collection Time: 06/13/2021 11:24 AM  Result Value Ref Range Status   Enterococcus faecalis NOT DETECTED NOT DETECTED Final   Enterococcus Faecium NOT DETECTED NOT DETECTED Final   Listeria monocytogenes NOT DETECTED NOT DETECTED Final   Staphylococcus species NOT DETECTED NOT DETECTED Final   Staphylococcus aureus (BCID) NOT DETECTED NOT DETECTED Final   Staphylococcus epidermidis NOT DETECTED NOT DETECTED Final   Staphylococcus lugdunensis NOT DETECTED NOT DETECTED Final   Streptococcus species DETECTED (A) NOT DETECTED Final    Comment: Not Enterococcus species, Streptococcus agalactiae, Streptococcus pyogenes, or Streptococcus pneumoniae. CRITICAL RESULT CALLED TO, READ BACK BY AND VERIFIED WITH: CATHY PIERCE PHARMD '@1021'$  06/30/21 EB    Streptococcus agalactiae NOT DETECTED NOT DETECTED Final   Streptococcus pneumoniae NOT DETECTED NOT DETECTED Final   Streptococcus pyogenes NOT DETECTED NOT DETECTED Final   A.calcoaceticus-baumannii NOT DETECTED NOT DETECTED Final   Bacteroides fragilis NOT DETECTED NOT DETECTED Final   Enterobacterales NOT DETECTED NOT DETECTED Final   Enterobacter cloacae complex NOT DETECTED NOT DETECTED Final  Escherichia coli NOT DETECTED NOT DETECTED Final   Klebsiella aerogenes NOT DETECTED NOT DETECTED Final   Klebsiella oxytoca NOT DETECTED NOT DETECTED Final   Klebsiella pneumoniae NOT DETECTED NOT DETECTED Final   Proteus species NOT DETECTED NOT DETECTED Final   Salmonella species NOT DETECTED NOT DETECTED Final   Serratia marcescens NOT DETECTED NOT DETECTED Final   Haemophilus influenzae NOT DETECTED NOT DETECTED Final   Neisseria meningitidis NOT DETECTED NOT DETECTED Final    Pseudomonas aeruginosa NOT DETECTED NOT DETECTED Final   Stenotrophomonas maltophilia NOT DETECTED NOT DETECTED Final   Candida albicans NOT DETECTED NOT DETECTED Final   Candida auris NOT DETECTED NOT DETECTED Final   Candida glabrata NOT DETECTED NOT DETECTED Final   Candida krusei NOT DETECTED NOT DETECTED Final   Candida parapsilosis NOT DETECTED NOT DETECTED Final   Candida tropicalis NOT DETECTED NOT DETECTED Final   Cryptococcus neoformans/gattii NOT DETECTED NOT DETECTED Final    Comment: Performed at [facility], [place] [address], [place], [place] [address]  Resp Panel by RT-PCR (Flu A&B, Covid) Nasopharyngeal Swab     Status: None   Collection Time: 06/07/2021 11:56 AM   Specimen: Nasopharyngeal Swab; Nasopharyngeal(NP) swabs in vial transport medium  Result Value Ref Range Status   SARS Coronavirus 2 by RT PCR NEGATIVE NEGATIVE Final    Comment: (NOTE) SARS-CoV-2 target nucleic acids are NOT DETECTED.  The SARS-CoV-2 RNA is generally detectable in upper respiratory specimens during the acute phase of infection. The lowest concentration of SARS-CoV-2 viral copies this assay can detect is 138 copies/mL. A negative result does not preclude SARS-Cov-2 infection and should not be used as the sole basis for treatment or other patient management decisions. A negative result may occur with  improper specimen collection/handling, submission of specimen other than nasopharyngeal swab, presence of viral mutation(s) within the areas targeted by this assay, and inadequate number of viral copies(<138 copies/mL). A negative result must be combined with clinical observations, patient history, and epidemiological information. The expected result is Negative.  Fact Sheet for Patients:  EntrepreneurPulse.com.au  Fact Sheet for Healthcare Providers:  IncredibleEmployment.be  This test is no t yet approved or cleared by the [place] FDA and   has been authorized for detection and/or diagnosis of SARS-CoV-2 by FDA under an Emergency Use Authorization (EUA). This EUA will remain  in effect (meaning this test can be used) for the duration of the COVID-19 declaration under Section 564(b)(1) of the Act, 21 U.S.C.section 360bbb-3(b)(1), unless the authorization is terminated  or revoked sooner.       Influenza A by PCR NEGATIVE NEGATIVE Final   Influenza B by PCR NEGATIVE NEGATIVE Final    Comment: (NOTE) The Xpert Xpress SARS-CoV-2/FLU/RSV plus assay is intended as an aid in the diagnosis of influenza from Nasopharyngeal swab specimens and should not be used as a sole basis for treatment. Nasal washings and aspirates are unacceptable for Xpert Xpress SARS-CoV-2/FLU/RSV testing.  Fact Sheet for Patients: EntrepreneurPulse.com.au  Fact Sheet for Healthcare Providers: IncredibleEmployment.be  This test is not yet approved or cleared by the [place] FDA and has been authorized for detection and/or diagnosis of SARS-CoV-2 by FDA under an Emergency Use Authorization (EUA). This EUA will remain in effect (meaning this test can be used) for the duration of the COVID-19 declaration under Section 564(b)(1) of the Act, 21 U.S.C. section 360bbb-3(b)(1), unless the authorization is terminated or revoked.  Performed at [facility], [place],  [place] [address]   Culture, blood (Routine x 2)     Status: None (Preliminary result)   Collection Time: 06/16/2021  5:58 PM   Specimen: BLOOD  Result Value Ref Range Status   Specimen Description BLOOD RIGHT FEMORAL ARTERY  Final   Special Requests   Final    BOTTLES DRAWN AEROBIC AND ANAEROBIC Blood Culture adequate volume   Culture   Final    NO GROWTH 3 DAYS Performed at [facility], [address] [place], [place] [address]    Report Status PENDING  Incomplete  MRSA Next Gen by PCR, Nasal     Status: None    Collection Time: 06/07/2021  7:48 PM   Specimen: Nasal Mucosa; Nasal Swab  Result Value Ref Range Status   MRSA by PCR Next Gen NOT DETECTED NOT DETECTED Final    Comment: (NOTE) The GeneXpert MRSA Assay (FDA approved for NASAL specimens only), is one component of a comprehensive MRSA colonization surveillance program. It is not intended to diagnose MRSA infection nor to guide or monitor treatment for MRSA infections. Test performance is not FDA approved in patients less than 68 years old. Performed at [facility], [place] [address], [place], [place] [address]   Culture, blood (Routine X 2) w Reflex to ID Panel     Status: None (Preliminary result)   Collection Time: 07/01/21 11:43 AM   Specimen: BLOOD  Result Value Ref Range Status   Specimen Description BLOOD SITE NOT SPECIFIED  Final   Special Requests   Final    BOTTLES DRAWN AEROBIC ONLY Blood Culture results may not be optimal due to an inadequate volume of blood received in culture bottles   Culture   Final    NO GROWTH < 24 HOURS Performed at [facility], [place] [address] [place], [place] [address]    Report Status PENDING  Incomplete  Culture, blood (Routine X 2) w Reflex to ID Panel     Status: None (Preliminary result)   Collection Time: 07/01/21 11:43 AM   Specimen: BLOOD  Result Value Ref Range Status   Specimen Description BLOOD SITE NOT SPECIFIED  Final   Special Requests   Final    BOTTLES DRAWN AEROBIC ONLY Blood Culture results may not be optimal due to an inadequate volume of blood received in culture bottles   Culture   Final    NO GROWTH < 24 HOURS Performed at [facility], [place] [address], [place], [place] [address]    Report Status PENDING  Incomplete    Lab Basic Metabolic Panel: Recent Labs  Lab 06/05/2021 1126 06/13/2021 1135 06/21/2021 1804 06/28/2021 1812 06/30/21 0218 07/01/21 0320 07/01/21 0401 07/01/21 0637 14-Jul-2021 0437  NA 139 137   < > 137 135 134* 133* 135 137  K 5.5* 5.4*    < > 4.7 4.6 3.9 3.8 3.7 4.3  CL 99 99  --  97* 97* 98  --   --  98  CO2 24  --   --  20* 21* 24  --   --  30  GLUCOSE 87 86  --  102* 107* 104*  --   --  106*  BUN 45* 43*  --  44* 47* 44*  --   --  46*  CREATININE 2.73* 2.50*  --  2.91* 2.71* 2.32*  --   --  2.07*  CALCIUM 7.7*  --   --  8.1* 8.1* 7.9*  --   --  8.1*  MG  --   --   --   --  2.1  --   --   --   --   PHOS  --   --   --   --  2.7  --   --   --   --    < > = values in this interval not displayed.   Liver Function Tests: Recent Labs  Lab 06/22/2021 1126 07/01/21 0320 07/03/21 0437  AST '17 22 17  '$ ALT '11 14 13  '$ ALKPHOS 62 66 71  BILITOT 2.2* 2.1* 0.9  PROT 5.5* 5.3* 5.7*  ALBUMIN 2.3* 2.0* 1.9*   No results for input(s): LIPASE, AMYLASE in the last 168 hours. No results for input(s): AMMONIA in the last 168 hours. CBC: Recent Labs  Lab 06/14/2021 1126 06/11/2021 1135 06/30/21 0218 07/01/21 0320 07/01/21 0401 07/01/21 0637 07-03-2021 0437  WBC 10.7*  --  22.8* 19.2*  --   --  20.3*  NEUTROABS 7.1  --   --   --   --   --   --   HGB 10.1*   < > 10.8* 10.4* 10.5* 10.2* 10.4*  HCT 34.8*   < > 34.4* 31.9* 31.0* 30.0* 33.7*  MCV 111.2*  --  101.2* 97.0  --   --  102.7*  PLT 310  --  376 324  --   --  300   < > = values in this interval not displayed.   Cardiac Enzymes: No results for input(s): CKTOTAL, CKMB, CKMBINDEX, TROPONINI in the last 168 hours. Sepsis Labs: Recent Labs  Lab 06/08/2021 1124 06/12/2021 1126 06/01/2021 1811 06/30/21 0218 07/01/21 0320 2021-07-03 0437  WBC  --  10.7*  --  22.8* 19.2* 20.3*  LATICACIDVEN 1.9  --  2.0*  --   --   --      Candee Furbish Jul 03, 2021, 2:19 PM

## 2021-08-02 NOTE — Consult Note (Signed)
Palliative Medicine Inpatient Consult Note  Consulting Provider: Candee Furbish, MD  Reason for consult:   Crystal [name] Palliative Medicine Consult  Reason for Consult? malnutrition, FTT, 87 years old, help with family discussions   HPI:  Per intake H&P --> Crystal [name] is a 87 y.o. 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 Has been in the ICU for the past day for acute respiratory failure on Bipap presently. Has significant failure to thrive and is suspected to be in the end of life phases. Palliative care has been asked to conduct additional goals of care conversations with patients son, Jenny Reichmann.  Clinical Assessment/Goals of Care:  *Please note that this is a verbal dictation therefore any spelling or grammatical errors are due to the "[place]" system interpretation.  I have reviewed medical records including EPIC notes, labs and imaging, received report from bedside RN, assessed the patient who is lying in bed on oxygen, she is altered.     I called patients son, Bartolo Darter to further discuss diagnosis prognosis, Chilo, EOL wishes, disposition and options.  John and I reviewed that Penley has been struggling for quite sometime now. He shares that he has been caring for there the past five years and over the last severn months she has quickly deteriorated. He reviewed that her mental state has much to do with this.    I introduced Palliative Medicine as specialized medical care for people living with serious illness. It focuses on providing relief from the symptoms and stress of a serious illness. The goal is to improve quality of life for both the patient and the family.  Crystal [name] is from [place], [place] originally. She has lived in [place] for  most of her life. She is not presently married. She had two children one of whom is deceased. 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 She is somewhat faithful and ascribes to the Protestant denomination.  Prior to admission, Jalesia was dependent on all bADL's per her son. He was lifting her to and from the chair/bed. He was doing all the activities such as bathing/dressing/etc.   A detailed discussion was had today regarding advanced directives, we have non on file though patients son is here only living family therefore would be her surrogate decision maker.    Concepts specific to code status, artifical feeding and hydration, continued IV antibiotics and rehospitalization was had.  Patient is DNAR/DNI.  The difference between a aggressive medical intervention path and a palliative comfort care path for this patient at this time was had. We reviewed that taking Crystal [name]'s whole clinical picture into account and her ongoing decline over months that she is exhibiting end of life signs.  We talked about transition to comfort measures in house and what that would entail inclusive of medications to control pain, dyspnea, agitation, nausea, itching, and hiccups.   We discussed stopping all uneccessary measures such as blood draws, needle sticks, and frequent vital signs. John shares that "it's her time", he sounded tearful over the phone.  Utilized reflective listening throughout our time together. We reviewed that we will start comfort measures in the hospital with the anticipation depending on symptoms that Crystal [name] will transition to an inpatient hospice. He shares that he has a lot of transportation issues so it  would need to be close by. We reviewed the local hospice homes and he has selected [facility].   Discussed the importance of continued conversation with family and their  medical providers regarding overall plan of care and treatment options, ensuring decisions are within the  context of the patients values and GOCs.  Decision Maker: Bartolo Darter (son) [phone]  SUMMARY OF RECOMMENDATIONS   DNAR/DNI  Comfort care - Comfort meds per [facility]  Unrestricted visitation  Please remove Bipap  Request Chaplain for prayer  Transfer to 6N when a bed is available  [facility] - Request for bed at [facility]  Ongoing PMT support  Code Status/Advance Care Planning: DNAR/DNI   Palliative Prophylaxis:  Oral Care, Turn Q2H  Additional Recommendations (Limitations, Scope, Preferences): Comfort Care   Psycho-social/Spiritual:  Desire for further Chaplaincy support: Yes Additional Recommendations: Education on end of life process   Prognosis: Limited to days.  Discharge Planning: Discharge to inpatient hospice, [facility] once a bed is available  Vitals:   2021/08/01 0700 August 01, 2021 0858  BP:  (!) 108/32  Pulse:  69  Resp:  18  Temp: 97.6 F (36.4 C)   SpO2:      Intake/Output Summary (Last 24 hours) at 08-01-21 1004 Last data filed at 2021-08-01 0700 Gross per 24 hour  Intake 534.43 ml  Output 260 ml  Net 274.43 ml   Last Weight  Most recent update: 08/01/2021  4:32 AM    Weight  98.7 kg (217 lb 9.5 oz)            Gen: Elderly F in NAD HEENT: Dry mucous membranes CV: Regular rate and rhythm PULM: On Bipap ABD: soft/nontender  EXT: Anasarca, generalized edema Neuro: Somnolent  PPS: 10%   This conversation/these recommendations were discussed with patient primary care team, Dr. Tamala Julian  Time In: 0915 Time Out: 1025 Total Time: 70 Greater than 50%  of this time was spent counseling and coordinating care related to the above assessment and plan.  [place] Team Team Cell Phone: [phone] Please utilize secure chat with additional questions, if there is no response within 30 minutes please call the above phone number  Palliative Medicine Team providers are available by phone from 7am to 7pm daily  and can be reached through the team cell phone.  Should this patient require assistance outside of these hours, please call the patient's attending physician.

## 2021-08-02 DEATH — deceased

## 2022-03-31 IMAGING — CT CT HEAD W/O CM
4 series · 17 of 47 positions shown, 19 images · non-contrast
Comparison: 09/02/2010 MR

CLINICAL DATA: [AGE] female with fall and head injury.
Currently on blood thinners.

EXAM:
CT HEAD WITHOUT CONTRAST
TECHNIQUE: Contiguous axial images were obtained from the base of the skull
through the vertex without intravenous contrast.

[Series 3: head without · axial · non-contrast · 0.41mm/px · z∈[-526,-406]mm · 7 of 33 slices shown, 9 images]
[im 5/33  brain]
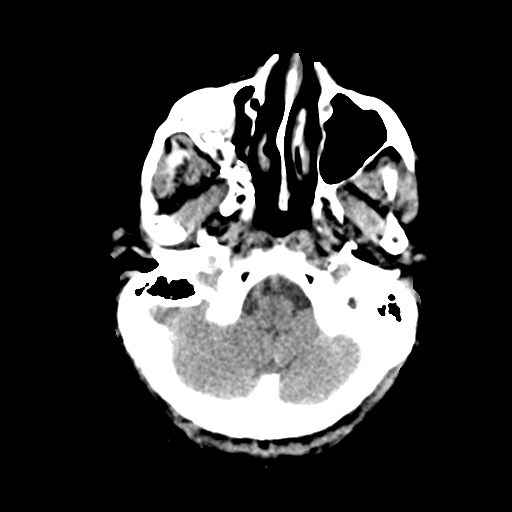
[im 5/33  bone]
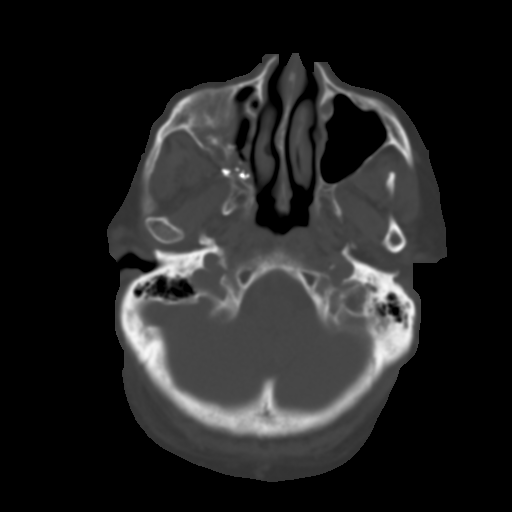
[im 9/33  brain]
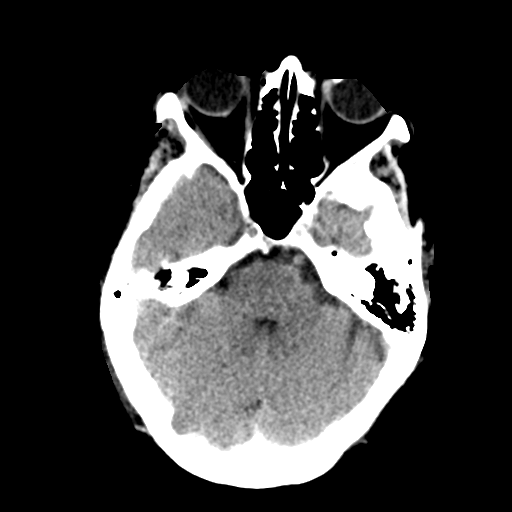
[im 13/33  brain]
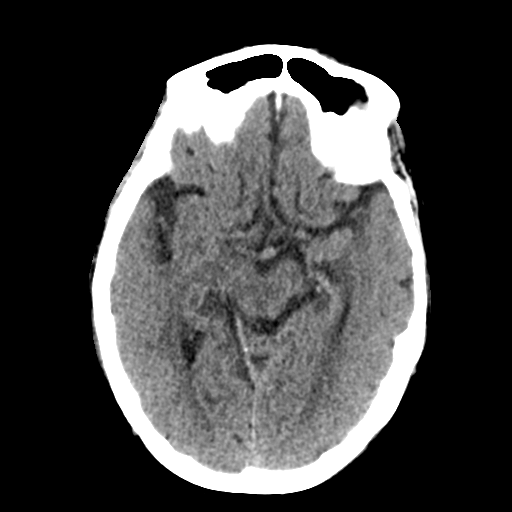
[im 17/33  brain]
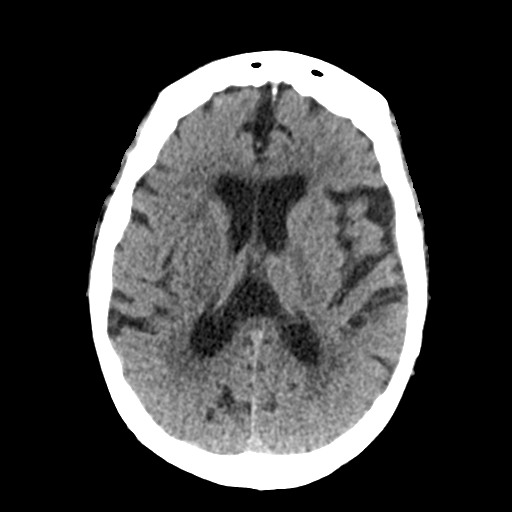
[im 21/33  brain]
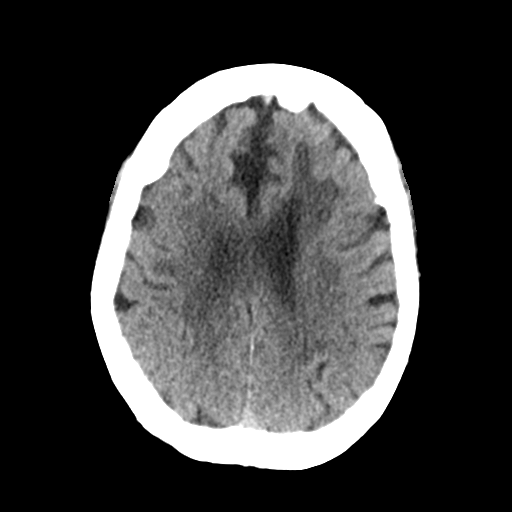
[im 21/33  bone]
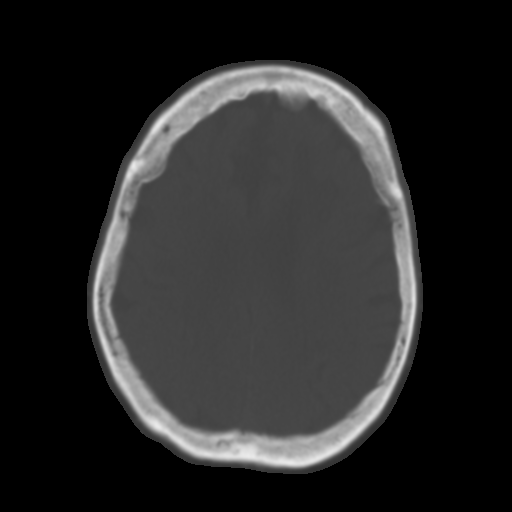
[im 25/33  brain]
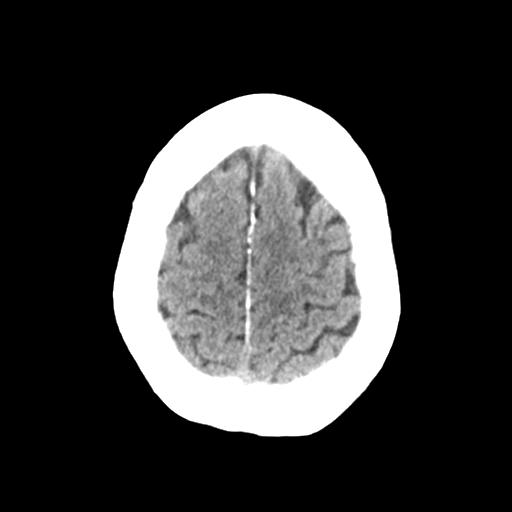
[im 29/33  brain]
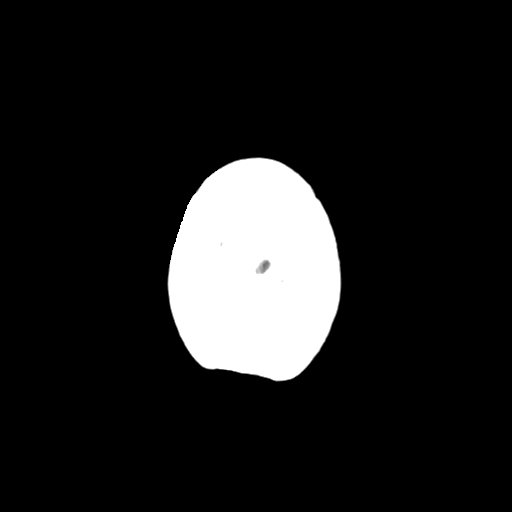

[Series 4: head bone · axial · 0.41mm/px · z∈[-530,-474]mm · 4 of 82 slices shown]
[im 9/82  bone]
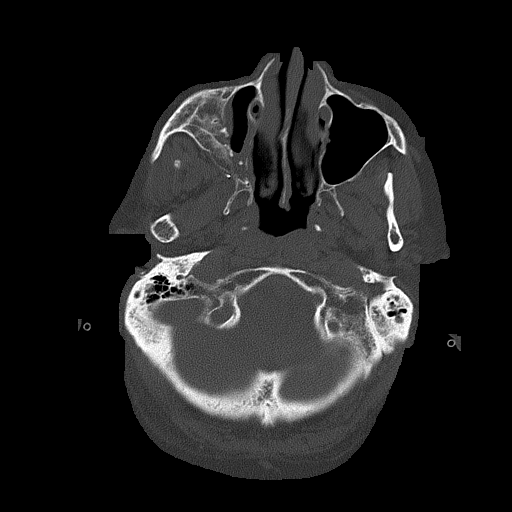
[im 17/82  bone]
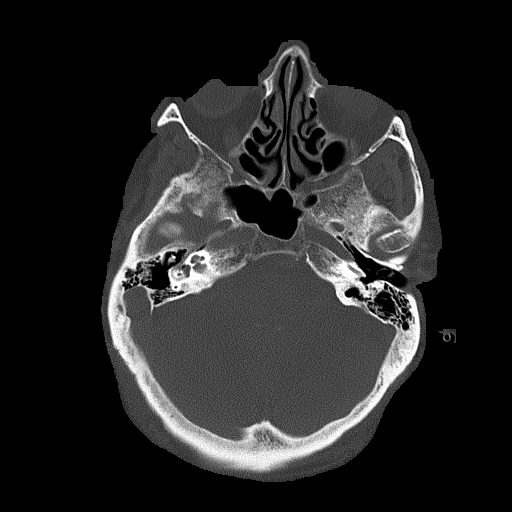
[im 25/82  bone]
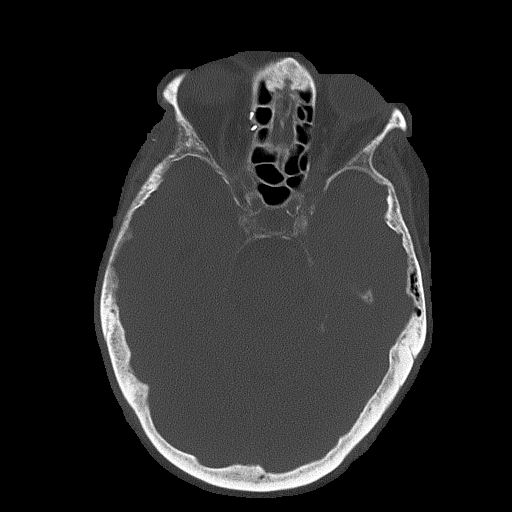
[im 37/82  bone]
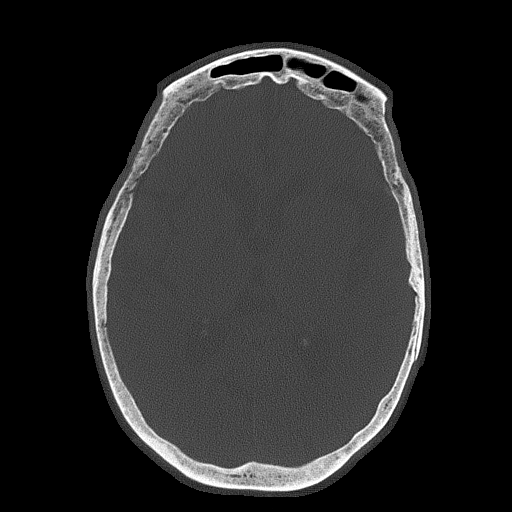

[Series 5: head without cor · coronal · non-contrast · 0.32mm/px · 3 of 67 slices shown]
[im 25/67  brain]
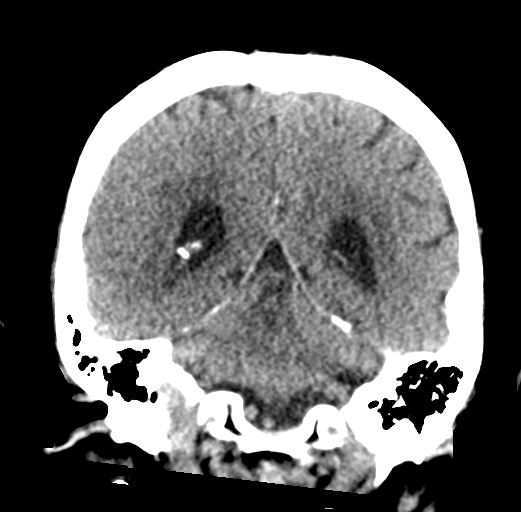
[im 31/67  brain]
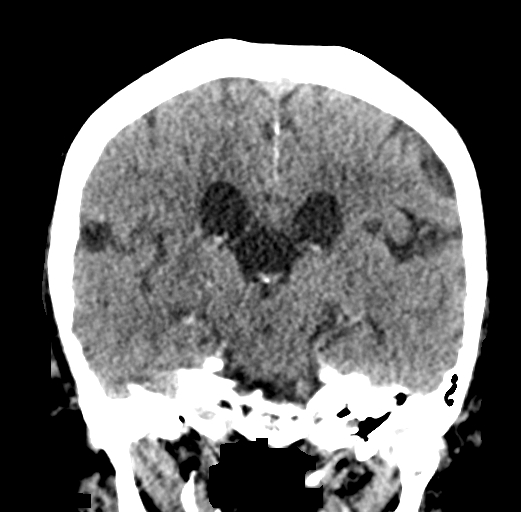
[im 36/67  brain]
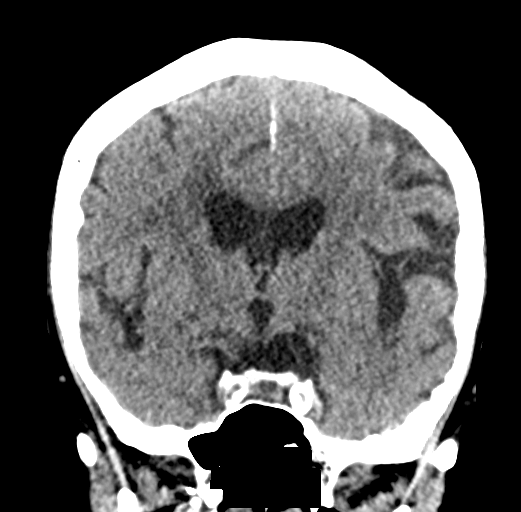

[Series 6: head without sag · sagittal · non-contrast · 0.32mm/px · 3 of 67 slices shown]
[im 25/67  brain]
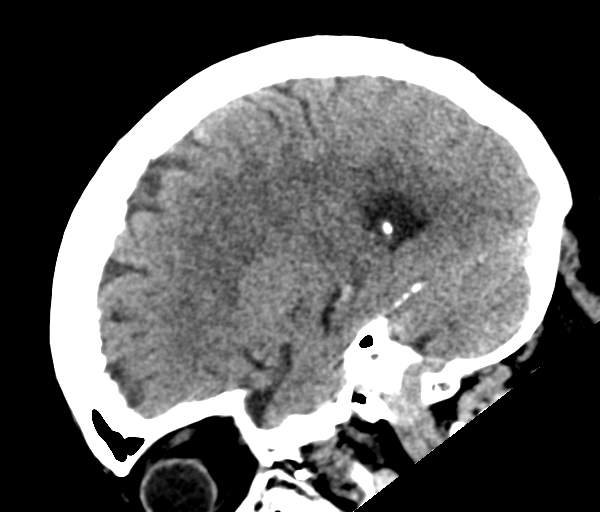
[im 34/67  brain]
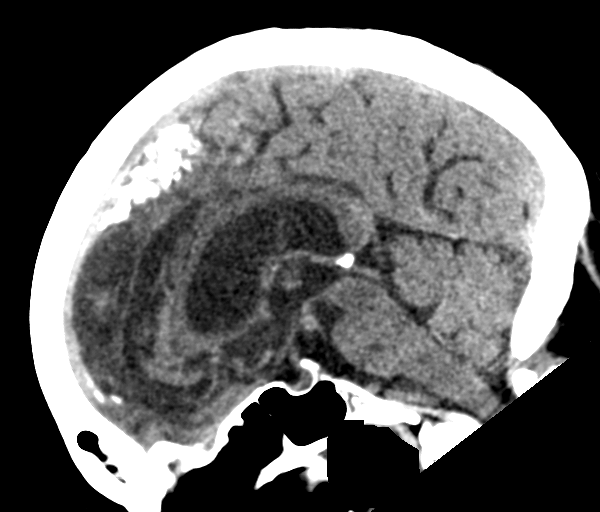
[im 42/67  brain]
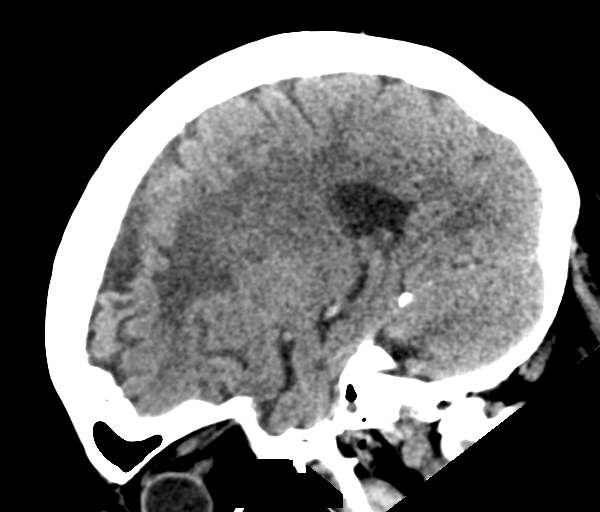

[17 of 47 positions shown; findings below may reference images not displayed]

FINDINGS: Brain: No evidence of acute infarction, hemorrhage, hydrocephalus,
extra-axial collection or mass lesion/mass effect.

White matter hypodensities and remote basal ganglia lacunar infarcts
are noted.

Vascular: Carotid atherosclerotic calcifications are noted.

Skull: Normal. Negative for fracture or focal lesion.

Sinuses/Orbits: No acute abnormality

Other: None
IMPRESSION: 1. No evidence of acute intracranial abnormality.
2. Chronic small vessel ischemic changes and remote basal ganglia
lacunar infarcts.
